# Patient Record
Sex: Female | Born: 1945 | Race: White | Hispanic: No | Marital: Married | Smoking: Former smoker
Health system: Southern US, Community
[De-identification: ages and names within clinical notes are randomized; demographics above are authoritative.]

## PROBLEM LIST (undated history)

## (undated) DIAGNOSIS — E669 Obesity, unspecified: Secondary | ICD-10-CM

## (undated) DIAGNOSIS — I10 Essential (primary) hypertension: Secondary | ICD-10-CM

## (undated) DIAGNOSIS — J449 Chronic obstructive pulmonary disease, unspecified: Secondary | ICD-10-CM

## (undated) DIAGNOSIS — K635 Polyp of colon: Secondary | ICD-10-CM

## (undated) DIAGNOSIS — C801 Malignant (primary) neoplasm, unspecified: Secondary | ICD-10-CM

## (undated) DIAGNOSIS — K2270 Barrett's esophagus without dysplasia: Secondary | ICD-10-CM

## (undated) DIAGNOSIS — Z9989 Dependence on other enabling machines and devices: Secondary | ICD-10-CM

## (undated) DIAGNOSIS — G4733 Obstructive sleep apnea (adult) (pediatric): Secondary | ICD-10-CM

## (undated) DIAGNOSIS — Z87891 Personal history of nicotine dependence: Secondary | ICD-10-CM

## (undated) DIAGNOSIS — K219 Gastro-esophageal reflux disease without esophagitis: Secondary | ICD-10-CM

## (undated) DIAGNOSIS — E785 Hyperlipidemia, unspecified: Secondary | ICD-10-CM

## (undated) HISTORY — DX: Chronic obstructive pulmonary disease, unspecified: J44.9

## (undated) HISTORY — DX: Obstructive sleep apnea (adult) (pediatric): G47.33

## (undated) HISTORY — DX: Hyperlipidemia, unspecified: E78.5

## (undated) HISTORY — DX: Personal history of nicotine dependence: Z87.891

## (undated) HISTORY — PX: LIVER BIOPSY: SHX301

## (undated) HISTORY — PX: COLONOSCOPY: SHX174

## (undated) HISTORY — PX: SKIN CANCER EXCISION: SHX779

## (undated) HISTORY — PX: ESOPHAGOGASTRODUODENOSCOPY: SHX1529

## (undated) HISTORY — DX: Dependence on other enabling machines and devices: Z99.89

---

## 1975-06-16 HISTORY — PX: APPENDECTOMY: SHX54

## 1989-06-15 HISTORY — PX: ABDOMINAL HYSTERECTOMY: SHX81

## 2001-08-03 LAB — HM COLONOSCOPY

## 2006-06-15 HISTORY — PX: NASAL SINUS SURGERY: SHX719

## 2009-04-30 LAB — PULMONARY FUNCTION TEST

## 2010-07-18 LAB — HM PAP SMEAR: HM Pap smear: NORMAL

## 2010-08-03 LAB — HM MAMMOGRAPHY: HM Mammogram: NORMAL

## 2011-07-14 DIAGNOSIS — H40009 Preglaucoma, unspecified, unspecified eye: Secondary | ICD-10-CM | POA: Diagnosis not present

## 2011-08-05 DIAGNOSIS — H40009 Preglaucoma, unspecified, unspecified eye: Secondary | ICD-10-CM | POA: Diagnosis not present

## 2011-08-19 ENCOUNTER — Institutional Professional Consult (permissible substitution): Payer: Self-pay | Admitting: Pulmonary Disease

## 2011-08-25 ENCOUNTER — Encounter: Payer: Self-pay | Admitting: Pulmonary Disease

## 2011-08-25 ENCOUNTER — Ambulatory Visit (INDEPENDENT_AMBULATORY_CARE_PROVIDER_SITE_OTHER): Payer: Medicare Other | Admitting: Pulmonary Disease

## 2011-08-25 VITALS — BP 128/70 | HR 85 | Temp 98.4°F | Ht 62.5 in | Wt 172.6 lb

## 2011-08-25 DIAGNOSIS — G4733 Obstructive sleep apnea (adult) (pediatric): Secondary | ICD-10-CM | POA: Diagnosis not present

## 2011-08-25 NOTE — Patient Instructions (Signed)
Continue with cpap, and keep up with mask changes and supplies. Work on weight reduction If you continue to have nonrestorative sleep, would consider rechecking your pressure with an autoset device at home. If doing well, followup with me in one year.

## 2011-08-25 NOTE — Assessment & Plan Note (Signed)
The patient has a long history of obstructive sleep apnea diagnosed back in 2004, but has done very well on CPAP therapy.  I do not have her original study for review, but will obtain the records from her physician in [place].  She is having no issues with her mask fit or pressure, and denies any issues with sleepiness during the day.  She is having some nonrestorative sleep, but believes this is not due to her sleep apnea.  If this continues, I have asked her to consider optimizing her pressure again at home on automatic device.  I have also encouraged her to work aggressively on weight loss.  If she continues to do well with her CPAP, I will see her back in one year for followup.

## 2011-08-25 NOTE — Progress Notes (Signed)
  Subjective:    Patient ID: Traci Hall, female    DOB: 1945/10/31, 66 y.o.   MRN: [id number]  HPI The patient is a 66 year old female who comes in today to establish with a sleep specialist for management of obstructive sleep apnea.  She was initially diagnosed with sleep apnea in 2004, and has been on CPAP since that time.  Her current machine is approximately 66 years old, is set at 7 cm, and her pressure is delivered by a nasal CPAP mask.  She has a heated humidifier, but does not use it as her personal preference.  The patient is wearing CPAP compliantly, and feels that she has benefited greatly from the device.  She feels that she cannot sleep without it.  She is having no breakthrough snoring, and feels that she sleeps well.  She does not feel fully rested in the mornings upon arising, but thinks this is more of a medical issue than her sleep apnea.  The patient denies any sleepiness issues during the day, and is very happy with her alertness.  Her Epworth sleepiness score today is zero.  Her weight has increased about 15 pounds over the last 1-2 years.  Sleep Questionnaire: What time do you typically go to bed?( Between what hours) 10-11pm How long does it take you to fall asleep? 15 to 20 min How many times during the night do you wake up? 1 What time do you get out of bed to start your day? 0830 Do you drive or operate heavy machinery in your occupation? No How much has your weight changed (up or down) over the past two years? (In pounds) 15 lb (6.804 kg) Have you ever had a sleep study before? Yes If yes, location of study? [facility] [place] If yes, date of study? 03/23/03 Do you currently use CPAP? Yes If so, what pressure? 7 Do you wear oxygen at any time? No     Review of Systems  Constitutional: Negative for fever and unexpected weight change.  HENT: Negative for ear pain, nosebleeds, congestion, sore throat, rhinorrhea, sneezing, trouble swallowing, dental problem, postnasal drip and  sinus pressure.   Eyes: Positive for itching. Negative for redness.  Respiratory: Positive for chest tightness. Negative for cough, shortness of breath and wheezing.   Cardiovascular: Negative for palpitations and leg swelling.  Gastrointestinal: Negative for nausea and vomiting.  Genitourinary: Negative for dysuria.  Musculoskeletal: Negative for joint swelling.  Skin: Negative for rash.  Neurological: Negative for headaches.  Hematological: Does not bruise/bleed easily.  Psychiatric/Behavioral: Negative for dysphoric mood. The patient is not nervous/anxious.        Objective:   Physical Exam Constitutional:  Overweight female, no acute distress  HENT:  Nares patent without discharge  Oropharynx without exudate, palate and uvula are mildly elongated.   Eyes:  Perrla, eomi, no scleral icterus  Neck:  No JVD, no TMG  Cardiovascular:  Normal rate, regular rhythm, no rubs or gallops.  No murmurs        Intact distal pulses  Pulmonary :  Normal breath sounds, no stridor or respiratory distress   No rales, rhonchi, or wheezing  Abdominal:  Soft, nondistended, bowel sounds present.  No tenderness noted.   Musculoskeletal:  minimal lower extremity edema noted.  Lymph Nodes:  No cervical lymphadenopathy noted  Skin:  No cyanosis noted  Neurologic:  Alert, appropriate, moves all 4 extremities without obvious deficit.         Assessment & Plan:

## 2011-08-27 ENCOUNTER — Telehealth: Payer: Self-pay | Admitting: Pulmonary Disease

## 2011-08-27 NOTE — Telephone Encounter (Signed)
Received copies from [facility] ,on 08/27/11 . Forwarded 15  pages to Dr Shelle Iron. ,for review.

## 2011-09-01 ENCOUNTER — Encounter: Payer: Self-pay | Admitting: Pulmonary Disease

## 2011-09-01 ENCOUNTER — Ambulatory Visit (INDEPENDENT_AMBULATORY_CARE_PROVIDER_SITE_OTHER): Payer: Medicare Other | Admitting: Pulmonary Disease

## 2011-09-01 VITALS — BP 116/72 | HR 72 | Temp 97.7°F | Ht 62.5 in | Wt 171.0 lb

## 2011-09-01 DIAGNOSIS — J4489 Other specified chronic obstructive pulmonary disease: Secondary | ICD-10-CM

## 2011-09-01 DIAGNOSIS — J309 Allergic rhinitis, unspecified: Secondary | ICD-10-CM | POA: Diagnosis not present

## 2011-09-01 DIAGNOSIS — J449 Chronic obstructive pulmonary disease, unspecified: Secondary | ICD-10-CM

## 2011-09-01 NOTE — Assessment & Plan Note (Signed)
Currently well controlled on her current regimen.  I think that we will try to stop the montelukast after pollen season as I don't see a clear indication for it now.  Continue other meds at this point.

## 2011-09-01 NOTE — Progress Notes (Signed)
Subjective:    Patient ID: Traci Hall, female    DOB: October 07, 1945, 66 y.o.   MRN: [id number]  HPI This is a 66 y/o female who is self referred to [place] for evaluation and treatment of her asthma.  Her childhood was normal, with no respiratory problems, but she had some allergies when [age] years old, specifically hay fever.  This was treated with OTC meds and well controlled.  In her 40's she developed wheezing and mild dyspnea.  In 2004 she was sent for PFT's which showed mild obstruction.  During 2010 her symptoms worsened, and she developed three exacerbations  These were described as wheezing, some shortness of breath with chest congestion, no sputum production but she did have a dry cough.  These were treated with nebulizers and prednisone by her PCP.  She has also had bronchitis multiple times which has been treated well z-packs.  She was evaluated by a pulmonologist once in [facility] who felt that reflux contributed to her asthma.  She quit smoking in 2006 with the help of wellbutrin.  She denies a chronic cough.  She notes that she now has dyspnea only with vigorous exercise such as climbing a hill.    She denies chest pain, but has noticed trace ankle swelling usually after having salt.  She does note some environmental triggers to her shortness of breath: diesel fumes, severely dusty environments.   Albuterol helps when she uses it, but her use is minimal.  She reports that she regularly uses it in the mornings as a matter of routine, and only once per week otherwise as needed.  She notes that she does have worsening allergic rhinitis symptoms in the spring with pollen.  She has been on montelukast and singulair for years and these have helped control these symptoms.  She does not exercise regularly.    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  She is now in a new home, and is consdering exercising more.    Past Medical History  Diagnosis Date   . Asthma   . Allergic rhinitis   . OSA on CPAP      Family History  Problem Relation Age of Onset  . Heart disease Father      History   Social History  . Marital Status: Married    Spouse Name: N/A    Number of Children: 2  . Years of Education: N/A   Occupational History  . retired     Social History Main Topics  . Smoking status: Former Smoker -- 1.5 packs/day for 45 years    Types: Cigarettes    Quit date: 06/15/2005  . Smokeless tobacco: Never Used  . Alcohol Use: Yes  . Drug Use: No  . Sexually Active: Not on file   Other Topics Concern  . Not on file   Social History Narrative  . No narrative on file     Allergies  Allergen Reactions  . Demerol     Over sensitivity  . Fosamax     Leg cramps   . Naproxen     Leg cramps an swelling    . Statins     Cramps in legs      Outpatient Prescriptions Prior to Visit  Medication Sig Dispense Refill  . albuterol (PROVENTIL HFA) 108 (90 BASE) MCG/ACT inhaler 2 puffs every am, and then as needed      . aspirin 81 MG tablet Take  81 mg by mouth daily.      . Bimatoprost (LUMIGAN OP) Apply to eye. 1 drop in each eye pm      . desloratadine (CLARINEX) 5 MG tablet Take 5 mg by mouth daily.      . Ergocalciferol (VITAMIN D2 PO) Take 1 tablet by mouth every 7 (seven) days.       . famotidine (PEPCID) 20 MG tablet Take 20 mg by mouth 2 (two) times daily.      . Fluticasone Propionate, Inhal, (FLOVENT DISKUS) 100 MCG/BLIST AEPB Inhale 1 puff into the lungs daily.       . Glucosamine-Chondroit-Vit C-Mn (GLUCOSAMINE 1500 COMPLEX PO) Take 1,500 tablets by mouth 2 (two) times daily.      . hydrocortisone (ANUSOL-HC) 25 MG suppository Place 25 mg rectally. Prn      . montelukast (SINGULAIR) 10 MG tablet Take 10 mg by mouth at bedtime.      . Multiple Vitamins-Minerals (CENTRUM SILVER PO) Take by mouth. Take 1 am      . olopatadine (PATANOL) 0.1 % ophthalmic solution 1 drop 2 (two) times daily.      Traci Hall  Glycol-Propyl Glycol (SYSTANE) 0.4-0.3 % SOLN As directed when needed      . tiotropium (SPIRIVA) 18 MCG inhalation capsule Place 18 mcg into inhaler and inhale daily.      . vitamin C (ASCORBIC ACID) 500 MG tablet Take 500 mg by mouth daily.      . vitamin E (VITAMIN E) 400 UNIT capsule Take 400 Units by mouth daily.      Marland Kitchen zolpidem (AMBIEN) 10 MG tablet Take 10 mg by mouth at bedtime as needed.           Review of Systems  Constitutional: Negative for fever, chills and unexpected weight change.  HENT: Negative for ear pain, nosebleeds, congestion, sore throat, rhinorrhea, sneezing, trouble swallowing, dental problem, voice change, postnasal drip and sinus pressure.   Eyes: Negative for visual disturbance.  Respiratory: Positive for chest tightness and shortness of breath. Negative for cough and choking.   Cardiovascular: Negative for chest pain and leg swelling.  Gastrointestinal: Negative for vomiting, abdominal pain and diarrhea.  Genitourinary: Negative for difficulty urinating.  Musculoskeletal: Positive for arthralgias.  Skin: Negative for rash.  Neurological: Negative for tremors, syncope and headaches.  Hematological: Does not bruise/bleed easily.       Objective:   Physical Exam  Filed Vitals:   09/01/11 1452  BP: 116/72  Pulse: 72  Temp: 97.7 F (36.5 C)  TempSrc: Oral  Height: 5' 2.5" (1.588 m)  Weight: 171 lb (77.565 kg)  SpO2: 98%   Gen: well appearing, no acute distress HEENT: NCAT, PERRL, EOMi, OP clear, neck supple without masses PULM: CTA B, no wheezing CV: RRR, no mgr, no JVD AB: BS+, soft, nontender, no hsm Ext: warm, no edema, no clubbing, no cyanosis Derm: no rash or skin breakdown Neuro: A&Ox4, CN II-XII intact, strength 5/5 in all 4 extremities  Simple spirometry performed today in clinic: F/F ratio 67% FEV1 1.74L (79% pred)     Assessment & Plan:   COPD (chronic obstructive pulmonary disease) COPD:  I explained to Ms. Traci Hall that I  feel it is more likely that she has COPD than asthma (as she has been diagnosed before).  She does have some symptoms which could be consistent with asthma such as her allergic rhinitis and response to steroids in the past.  However in this former heavy smoker  with fixed obstruction and lack of a significant bronchodilator response to prior PFT's I think that it is more likely that she has COPD with some airway hyperreactivity.  GOLD Stage II, mMRC score 1 Combined recommendations from the [employer], [employer] of [employer]icians, Designer, television/film set, European Respiratory Society (Qaseem A et al, Ann Intern Med. 2011;155(3):179) recommends tobacco cessation, pulmonary rehab (for symptomatic patients with an FEV1 < 50% predicted), supplemental oxygen (for patients with SaO2 <88% or paO2 <55), and appropriate bronchodilator therapy.  In regards to long acting bronchodilators, they recommend monotherapy (FEV1 60-80% with symptoms weak evidence, FEV1 with symptoms <60% strong evidence), or combination therapy (FEV1 <60% with symptoms, strong recommendation, moderate evidence).  One should also provide patients with annual immunizations and consider therapy for prevention of COPD exacerbations (ie. roflumilast or azithromycin) when appopriate.  -O2 therapy: not indicated -Immunizations: up to date -Tobacco use: quit 2010 -Exercise: encouraged to exercise regularly, goal 35 minutes daily -Bronchodilator therapy: no changes to current regimen, but I think we could stop the montelukast after allergy season -Exacerbation prevention: no indication at this point   Rhinitis, allergic Currently well controlled on her current regimen.  I think that we will try to stop the montelukast after pollen season as I don't see a clear indication for it now.  Continue other meds at this point.    Updated Medication List Outpatient Encounter Prescriptions as of 09/01/2011  Medication  Sig Dispense Refill  . albuterol (PROVENTIL HFA) 108 (90 BASE) MCG/ACT inhaler 2 puffs every am, and then as needed      . aspirin 81 MG tablet Take 81 mg by mouth daily.      . Bimatoprost (LUMIGAN OP) Apply to eye. 1 drop in each eye pm      . desloratadine (CLARINEX) 5 MG tablet Take 5 mg by mouth daily.      . Ergocalciferol (VITAMIN D2 PO) Take 1 tablet by mouth every 7 (seven) days.       . famotidine (PEPCID) 20 MG tablet Take 20 mg by mouth 2 (two) times daily.      . Fluticasone Propionate, Inhal, (FLOVENT DISKUS) 100 MCG/BLIST AEPB Inhale 1 puff into the lungs daily.       . Glucosamine-Chondroit-Vit C-Mn (GLUCOSAMINE 1500 COMPLEX PO) Take 1,500 tablets by mouth 2 (two) times daily.      . hydrocortisone (ANUSOL-HC) 25 MG suppository Place 25 mg rectally. Prn      . montelukast (SINGULAIR) 10 MG tablet Take 10 mg by mouth at bedtime.      . Multiple Vitamins-Minerals (CENTRUM SILVER PO) Take by mouth. Take 1 am      . olopatadine (PATANOL) 0.1 % ophthalmic solution 1 drop 2 (two) times daily.      Traci Hall Glycol-Propyl Glycol (SYSTANE) 0.4-0.3 % SOLN As directed when needed      . tiotropium (SPIRIVA) 18 MCG inhalation capsule Place 18 mcg into inhaler and inhale daily.      . vitamin C (ASCORBIC ACID) 500 MG tablet Take 500 mg by mouth daily.      . vitamin E (VITAMIN E) 400 UNIT capsule Take 400 Units by mouth daily.      Marland Kitchen zolpidem (AMBIEN) 10 MG tablet Take 10 mg by mouth at bedtime as needed.

## 2011-09-01 NOTE — Assessment & Plan Note (Addendum)
COPD:  I explained to Traci Hall that I feel it is more likely that she has COPD than asthma (as she has been diagnosed before).  She does have some symptoms which could be consistent with asthma such as her allergic rhinitis and response to steroids in the past.  However in this former heavy smoker with fixed obstruction and lack of a significant bronchodilator response to prior PFT's I think that it is more likely that she has COPD with some airway hyperreactivity.  GOLD Stage II, mMRC score 1 Combined recommendations from the [employer], [employer]icians, Designer, television/film set, European Respiratory Society (Qaseem A et al, Ann Intern Med. 2011;155(3):179) recommends tobacco cessation, pulmonary rehab (for symptomatic patients with an FEV1 < 50% predicted), supplemental oxygen (for patients with SaO2 <88% or paO2 <55), and appropriate bronchodilator therapy.  In regards to long acting bronchodilators, they recommend monotherapy (FEV1 60-80% with symptoms weak evidence, FEV1 with symptoms <60% strong evidence), or combination therapy (FEV1 <60% with symptoms, strong recommendation, moderate evidence).  One should also provide patients with annual immunizations and consider therapy for prevention of COPD exacerbations (ie. roflumilast or azithromycin) when appopriate.  -O2 therapy: not indicated -Immunizations: up to date -Tobacco use: quit 2010 -Exercise: encouraged to exercise regularly, goal 35 minutes daily -Bronchodilator therapy: no changes to current regimen, but I think we could stop the montelukast after allergy season -Exacerbation prevention: no indication at this point

## 2011-09-01 NOTE — Patient Instructions (Signed)
Continue taking your medications as prescribed.  Try to exercise 35 minutes daily. (Gradually increase your exercise until you reach this goal.) We will send you for a chest x-ray at the [facility]. We will see you back here in three months.

## 2011-09-02 ENCOUNTER — Ambulatory Visit (INDEPENDENT_AMBULATORY_CARE_PROVIDER_SITE_OTHER)
Admission: RE | Admit: 2011-09-02 | Discharge: 2011-09-02 | Disposition: A | Discharge status: Home / Self Care | Payer: Medicare Other | Source: Ambulatory Visit | Attending: Pulmonary Disease | Admitting: Pulmonary Disease

## 2011-09-02 DIAGNOSIS — J449 Chronic obstructive pulmonary disease, unspecified: Secondary | ICD-10-CM | POA: Diagnosis not present

## 2011-09-03 ENCOUNTER — Telehealth: Payer: Self-pay | Admitting: Pulmonary Disease

## 2011-09-03 ENCOUNTER — Telehealth: Payer: Self-pay | Admitting: *Deleted

## 2011-09-03 NOTE — Telephone Encounter (Signed)
Received copies from [facility] ,on3/21/13 . Forwarded 146 pages to [facility] ,for review.

## 2011-09-03 NOTE — Telephone Encounter (Signed)
Spoke with pt and notified cxr normal per Dr. Kendrick Fries. Pt verbalized understanding and denied any questions.

## 2011-09-03 NOTE — Telephone Encounter (Signed)
Message copied by Christen Butter on Thu Sep 03, 2011  1:09 PM ------      Message from: Veto Kemps B      Created: Wed Sep 02, 2011  6:00 PM       L,            Can you let her know that her CXR was normal?            Thanks,      B

## 2011-09-15 ENCOUNTER — Encounter: Payer: Self-pay | Admitting: Pulmonary Disease

## 2011-10-02 ENCOUNTER — Ambulatory Visit (INDEPENDENT_AMBULATORY_CARE_PROVIDER_SITE_OTHER): Payer: Medicare Other | Admitting: Internal Medicine

## 2011-10-02 ENCOUNTER — Encounter: Payer: Self-pay | Admitting: Internal Medicine

## 2011-10-02 VITALS — BP 132/68 | HR 78 | Temp 98.5°F | Resp 16 | Ht 62.5 in | Wt 173.0 lb

## 2011-10-02 DIAGNOSIS — R7309 Other abnormal glucose: Secondary | ICD-10-CM | POA: Diagnosis not present

## 2011-10-02 DIAGNOSIS — R635 Abnormal weight gain: Secondary | ICD-10-CM | POA: Diagnosis not present

## 2011-10-02 DIAGNOSIS — K7581 Nonalcoholic steatohepatitis (NASH): Secondary | ICD-10-CM

## 2011-10-02 DIAGNOSIS — G4733 Obstructive sleep apnea (adult) (pediatric): Secondary | ICD-10-CM

## 2011-10-02 DIAGNOSIS — R739 Hyperglycemia, unspecified: Secondary | ICD-10-CM

## 2011-10-02 DIAGNOSIS — E669 Obesity, unspecified: Secondary | ICD-10-CM | POA: Diagnosis not present

## 2011-10-02 DIAGNOSIS — Z1239 Encounter for other screening for malignant neoplasm of breast: Secondary | ICD-10-CM

## 2011-10-02 DIAGNOSIS — J309 Allergic rhinitis, unspecified: Secondary | ICD-10-CM

## 2011-10-02 DIAGNOSIS — K7689 Other specified diseases of liver: Secondary | ICD-10-CM | POA: Diagnosis not present

## 2011-10-02 NOTE — Progress Notes (Signed)
Patient ID: Traci Hall, female   DOB: 03/02/1946, 66 y.o.   MRN: [id number]  Patient Active Problem List  Diagnoses  . OSA (obstructive sleep apnea)  . COPD (chronic obstructive pulmonary disease)  . Rhinitis, allergic  . NASH (nonalcoholic steatohepatitis)    Subjective:  CC:   Chief Complaint  Patient presents with  . New Patient    HPI:   Traci Hall a 66 y.o. female who presents  to establish care.  Having problems with allergies,  worse since she relocated from [place], taking singulair and clarinex , no prior trial of zyrtec or allegra.  Prior trial of Flonase was not effective.  History of NASH with liver biopsy  x 2 by [facility] , GI in [place]  with periodic normal liver functions, with medications considered the culprit for elevations  Was taken off of PPIs and placed on  Pepcid 40 mg bid for reflux management, symptoms controlled on 20 mg  Bid.  Has not had labs in one year. [name], PCP in [place] , [place].  Diet complicated by living in a hotel for the past year.  Finally settling into a new home, looking forward to getting back to a regimen.    Past Medical History  Diagnosis Date  . Asthma   . Allergic rhinitis   . OSA on CPAP   . COPD (chronic obstructive pulmonary disease)     by prior PFTs  . Hyperlipidemia     Past Surgical History  Procedure Date  . Nasal sinus surgery 2008  . Appendectomy 1977  . Abdominal hysterectomy 1991    secondary to endometriosis, and polyps         The following portions of the patient's history were reviewed and updated as appropriate: Allergies, current medications, and problem list.    Review of Systems:   12 Pt  review of systems was negative except those addressed in the HPI,     History   Social History  . Marital Status: Married    Spouse Name: N/A    Number of Children: 2  . Years of Education: N/A   Occupational History  . retired     Social History Main Topics  . Smoking status:  Former Smoker -- 1.5 packs/day for 45 years    Types: Cigarettes    Quit date: 06/15/2005  . Smokeless tobacco: Never Used  . Alcohol Use: Yes  . Drug Use: No  . Sexually Active: Not on file   Other Topics Concern  . Not on file   Social History Narrative  . No narrative on file    Objective:  BP 132/68  Pulse 78  Temp(Src) 98.5 F (36.9 C) (Oral)  Resp 16  Ht 5' 2.5" (1.588 m)  Wt 173 lb (78.472 kg)  BMI 31.14 kg/m2  SpO2 97%  General appearance: alert, cooperative and appears stated age Ears: normal TM's and external ear canals both ears Throat: lips, mucosa, and tongue normal; teeth and gums normal Neck: no adenopathy, no carotid bruit, supple, symmetrical, trachea midline and thyroid not enlarged, symmetric, no tenderness/mass/nodules Back: symmetric, no curvature. ROM normal. No CVA tenderness. Lungs: clear to auscultation bilaterally Heart: regular rate and rhythm, S1, S2 normal, no murmur, click, rub or gallop Abdomen: soft, non-tender; bowel sounds normal; no masses,  no organomegaly Pulses: 2+ and symmetric Skin: Skin color, texture, turgor normal. No rashes or lesions Lymph nodes: Cervical, supraclavicular, and axillary nodes normal.  Assessment and Plan:  NASH (nonalcoholic steatohepatitis)  She is due for fasting lipids and CMET.   I have addressed BMI and recommended a low glycemic index diet utilizing smaller more frequent meals to increase metabolism.  I have also recommended that patient start exercising with a goal of 30 minutes of aerobic exercise a minimum of 5 days per week. Screening for lipid disorders, thyroid and diabetes to be done today.    OSA (obstructive sleep apnea) NPSG 2004:  AHI 25/hr Titration 2007: optimal pressure 8.5cm.    Rhinitis, allergic Symptoms not well controlled on current regimen.  Trial of allegra or zyrtec recommended.     Updated Medication List Outpatient Encounter Prescriptions as of 10/02/2011  Medication Sig  Dispense Refill  . albuterol (PROVENTIL HFA) 108 (90 BASE) MCG/ACT inhaler 2 puffs every am, and then as needed      . aspirin 81 MG tablet Take 81 mg by mouth daily.      . Bimatoprost (LUMIGAN OP) Apply to eye. 1 drop in each eye pm      . desloratadine (CLARINEX) 5 MG tablet Take 5 mg by mouth daily.      . Ergocalciferol (VITAMIN D2 PO) Take 1 tablet by mouth every 7 (seven) days.       . famotidine (PEPCID) 20 MG tablet Take 20 mg by mouth 2 (two) times daily.      . Fluticasone Propionate, Inhal, (FLOVENT DISKUS) 100 MCG/BLIST AEPB Inhale 1 puff into the lungs daily.       . Glucosamine-Chondroit-Vit C-Mn (GLUCOSAMINE 1500 COMPLEX PO) Take 1,500 tablets by mouth 2 (two) times daily.      . hydrocortisone (ANUSOL-HC) 25 MG suppository Place 25 mg rectally. Prn      . montelukast (SINGULAIR) 10 MG tablet Take 10 mg by mouth at bedtime.      . Multiple Vitamins-Minerals (CENTRUM SILVER PO) Take by mouth. Take 1 am      . olopatadine (PATANOL) 0.1 % ophthalmic solution 1 drop 2 (two) times daily.      [name] Glycol-Propyl Glycol (SYSTANE) 0.4-0.3 % SOLN As directed when needed      . tiotropium (SPIRIVA) 18 MCG inhalation capsule Place 18 mcg into inhaler and inhale daily.      . vitamin C (ASCORBIC ACID) 500 MG tablet Take 500 mg by mouth daily.      . vitamin E (VITAMIN E) 400 UNIT capsule Take 400 Units by mouth daily.      Marland Kitchen zolpidem (AMBIEN) 10 MG tablet Take 10 mg by mouth at bedtime as needed.          Orders Placed This Encounter  Procedures  . HM MAMMOGRAPHY  . MM Digital Screening  . COMPLETE METABOLIC PANEL WITH GFR  . Lipid panel  . TSH  . Hemoglobin A1c  . HM COLONOSCOPY    Return in about 1 month (around 11/01/2011).

## 2011-10-02 NOTE — Patient Instructions (Addendum)
Consider trial of allegra (fexofenadine ,  180 mg dose ) instead  of clarinex,   or zyrtec at bedtime ( cetirizine 10 mg daily )   Consider the Low Glycemic Index Diet and 6 smaller meals daily  To manage fatty liver,  Weight, and borderline insulin resistance.     7 AM Low carbohydrate Protein Shakes (EAS Carb Control  Or Atkins ,  Available everywhere,   In  cases at [employer] )  2.5 carbs  (Add or substitute a toasted sandwhich thin w/ peanut butter)  10 AM: Protein bar by Atkins (snack size,  Chocolate lover's variety at  [employer])    Lunch: sandwich on pita bread or flatbread (Joseph's makes a pita bread and a flat bread , available at [employer] and [employer]; Toufayah makes a low carb flatbread available at [employer] and [employer])  Mission makes a low carb whole wheat tortilla to make sandwiches and breakfast burritos.    3 PM:  Mid day :  Another protein bar,  Or a  cheese stick, 1/4 cup of almonds, walnuts, pistachios, pecans, peanuts,  Macadamia nuts  6 PM  Dinner:  "mean and green:"  Meat/chicken/fish, salad, and green veggie : use ranch, vinagrette,  Blue cheese, etc  9 PM snack : Breyer's low carb fudgiscle or  ice cream bar (Carb Smart) Weight Watcher's ice cream bar , or another protein shake

## 2011-10-02 NOTE — Assessment & Plan Note (Addendum)
She is due for fasting lipids and CMET.   I have addressed BMI and recommended a low glycemic index diet utilizing smaller more frequent meals to increase metabolism.  I have also recommended that patient start exercising with a goal of 30 minutes of aerobic exercise a minimum of 5 days per week. Screening for lipid disorders, thyroid and diabetes to be done today.

## 2011-10-04 NOTE — Assessment & Plan Note (Signed)
Symptoms not well controlled on current regimen.  Trial of allegra or zyrtec recommended.

## 2011-10-04 NOTE — Assessment & Plan Note (Signed)
NPSG 2004:  AHI 25/hr Titration 2007: optimal pressure 8.5cm.

## 2011-10-06 DIAGNOSIS — Z1231 Encounter for screening mammogram for malignant neoplasm of breast: Secondary | ICD-10-CM | POA: Diagnosis not present

## 2011-10-06 LAB — HM MAMMOGRAPHY: HM Mammogram: NORMAL

## 2011-10-27 ENCOUNTER — Telehealth: Payer: Self-pay | Admitting: Internal Medicine

## 2011-10-27 ENCOUNTER — Encounter: Payer: Self-pay | Admitting: Internal Medicine

## 2011-10-27 NOTE — Telephone Encounter (Signed)
Patient notified her mammogram is normal. 

## 2011-10-30 ENCOUNTER — Encounter: Payer: Self-pay | Admitting: Internal Medicine

## 2011-11-05 ENCOUNTER — Encounter: Payer: Medicare Other | Admitting: Internal Medicine

## 2011-11-12 ENCOUNTER — Other Ambulatory Visit: Payer: Medicare Other

## 2011-11-12 ENCOUNTER — Other Ambulatory Visit (INDEPENDENT_AMBULATORY_CARE_PROVIDER_SITE_OTHER): Payer: Medicare Other | Admitting: *Deleted

## 2011-11-12 DIAGNOSIS — R7309 Other abnormal glucose: Secondary | ICD-10-CM | POA: Diagnosis not present

## 2011-11-12 DIAGNOSIS — K7581 Nonalcoholic steatohepatitis (NASH): Secondary | ICD-10-CM

## 2011-11-12 DIAGNOSIS — K7689 Other specified diseases of liver: Secondary | ICD-10-CM

## 2011-11-12 DIAGNOSIS — R739 Hyperglycemia, unspecified: Secondary | ICD-10-CM

## 2011-11-12 DIAGNOSIS — R635 Abnormal weight gain: Secondary | ICD-10-CM

## 2011-11-12 LAB — COMPREHENSIVE METABOLIC PANEL
ALT: 53 U/L — ABNORMAL HIGH (ref 0–35)
AST: 40 U/L — ABNORMAL HIGH (ref 0–37)
Albumin: 3.9 g/dL (ref 3.5–5.2)
CO2: 26 mEq/L (ref 19–32)
Calcium: 8.9 mg/dL (ref 8.4–10.5)
Chloride: 108 mEq/L (ref 96–112)
Potassium: 4.2 mEq/L (ref 3.5–5.1)
Sodium: 142 mEq/L (ref 135–145)
Total Protein: 6.7 g/dL (ref 6.0–8.3)

## 2011-11-12 LAB — HEMOGLOBIN A1C: Hgb A1c MFr Bld: 5.9 % (ref 4.6–6.5)

## 2011-11-12 LAB — TSH: TSH: 2.63 u[IU]/mL (ref 0.35–5.50)

## 2011-11-16 ENCOUNTER — Ambulatory Visit (INDEPENDENT_AMBULATORY_CARE_PROVIDER_SITE_OTHER): Payer: Medicare Other | Admitting: Pulmonary Disease

## 2011-11-16 ENCOUNTER — Encounter: Payer: Self-pay | Admitting: Pulmonary Disease

## 2011-11-16 ENCOUNTER — Ambulatory Visit: Payer: Medicare Other | Admitting: Pulmonary Disease

## 2011-11-16 VITALS — BP 114/64 | HR 77 | Temp 98.3°F | Ht 62.5 in | Wt 171.1 lb

## 2011-11-16 DIAGNOSIS — J449 Chronic obstructive pulmonary disease, unspecified: Secondary | ICD-10-CM | POA: Diagnosis not present

## 2011-11-16 MED ORDER — TIOTROPIUM BROMIDE MONOHYDRATE 18 MCG IN CAPS: 18.0000 ug | ORAL_CAPSULE | Freq: Every day | RESPIRATORY_TRACT | Status: DC

## 2011-11-16 MED ORDER — DESLORATADINE 5 MG PO TABS: 5.0000 mg | ORAL_TABLET | Freq: Every day | ORAL | Status: DC

## 2011-11-16 MED ORDER — FLUTICASONE PROPIONATE (INHAL) 100 MCG/BLIST IN AEPB: 1.0000 | INHALATION_SPRAY | Freq: Two times a day (BID) | RESPIRATORY_TRACT | Status: DC

## 2011-11-16 MED ORDER — ALBUTEROL SULFATE HFA 108 (90 BASE) MCG/ACT IN AERS: 2.0000 | INHALATION_SPRAY | RESPIRATORY_TRACT | Status: DC | PRN

## 2011-11-16 NOTE — Patient Instructions (Signed)
Try to stop the singulair.  If you feel increase chest congestion, then increase the flovent to twice per day dosing before resuming the singulair. Otherwise continue your medications. We will see you back in 6 months or sooner if needed.

## 2011-11-16 NOTE — Progress Notes (Signed)
Subjective:    Patient ID: Traci Hall, female    DOB: [birth year], 66 y.o.   MRN: [id number]  Synopsis: Traci Hall first saw [facility] in 08/2011 for shortness of breath.  She was diagnosed with Grade A COPD.  She previously smoked 1.5 ppd for 45 years.  She has allergic rhinitis as well.  HPI   11/16/11 ROV -- Traci Hall has been doing very well.  She only had to use her albuterol a few times in the last few months due to chest congestion, but really has not had any trouble breathing.  She feels that she could stop the singulair.  She is taking the flovent once per day.  She continues to use the spiriva once per day.   Past Medical History  Diagnosis Date  . Asthma   . Allergic rhinitis   . OSA on CPAP   . COPD (chronic obstructive pulmonary disease)     by prior PFTs  . Hyperlipidemia       Review of Systems  Constitutional: Negative for fever, chills and unexpected weight change.  HENT: Positive for congestion. Negative for ear pain, nosebleeds, rhinorrhea and postnasal drip.   Respiratory: Negative for cough, choking, chest tightness and shortness of breath.   Cardiovascular: Negative for chest pain and leg swelling.       Objective:   Physical Exam   Filed Vitals:   11/16/11 1431  BP: 114/64  Pulse: 77  Temp: 98.3 F (36.8 C)  TempSrc: Oral  Height: 5' 2.5" (1.588 m)  Weight: 171 lb 1.9 oz (77.62 kg)  SpO2: 97%   Gen: well appearing, no acute distress HEENT: NCAT, PERRL, EOMi, OP clear, neck supple without masses PULM: CTA B, no wheezing CV: RRR, no mgr, no JVD AB: BS+, soft, nontender, no hsm Ext: warm, no edema, no clubbing, no cyanosis  08/2011 Simple spirometry: F/F ratio 67% FEV1 1.74L (79% pred)     Assessment & Plan:   COPD (chronic obstructive pulmonary disease) This has been a very stable interval for Traci Hall, Traci Hall.  She only had to use her albuterol a few times in the last few months and has been doing very well otherwise.  She  thinks that she could stop the singulair.  She is only using the Flovent once per day which is OK with me.   Plan: -stop singulair (trial) -if chest congestion develops after stopping singulair, then increase Flovent to bid -continue Spiriva -RTC in 6 months     Updated Medication List Outpatient Encounter Prescriptions as of 11/16/2011  Medication Sig Dispense Refill  . albuterol (PROVENTIL HFA) 108 (90 BASE) MCG/ACT inhaler 2 puffs every am, and then as needed      . aspirin 81 MG tablet Take 81 mg by mouth daily.      . Bimatoprost (LUMIGAN OP) Apply to eye. 1 drop in each eye pm      . desloratadine (CLARINEX) 5 MG tablet Take 5 mg by mouth daily.      . Ergocalciferol (VITAMIN D2 PO) Take 1 tablet by mouth every 7 (seven) days.       . famotidine (PEPCID) 20 MG tablet Take 20 mg by mouth 2 (two) times daily.      . Fluticasone Propionate, Inhal, (FLOVENT DISKUS) 100 MCG/BLIST AEPB Inhale 1 puff into the lungs daily.       . hydrocortisone (ANUSOL-HC) 25 MG suppository Place 25 mg rectally. Prn      . montelukast (SINGULAIR)  10 MG tablet Take 10 mg by mouth at bedtime.      . Multiple Vitamins-Minerals (CENTRUM SILVER PO) Take by mouth. Take 1 am      . olopatadine (PATANOL) 0.1 % ophthalmic solution 1 drop 2 (two) times daily.      [name] Glycol-Propyl Glycol (SYSTANE) 0.4-0.3 % SOLN As directed when needed      . tiotropium (SPIRIVA) 18 MCG inhalation capsule Place 18 mcg into inhaler and inhale daily.      . vitamin C (ASCORBIC ACID) 500 MG tablet Take 500 mg by mouth daily.      . vitamin E (VITAMIN E) 400 UNIT capsule Take 400 Units by mouth daily.      Marland Kitchen zolpidem (AMBIEN) 10 MG tablet Take 10 mg by mouth at bedtime as needed.       Marland Kitchen DISCONTD: Glucosamine-Chondroit-Vit C-Mn (GLUCOSAMINE 1500 COMPLEX PO) Take 1,500 tablets by mouth 2 (two) times daily.

## 2011-11-16 NOTE — Assessment & Plan Note (Signed)
This has been a very stable interval for Traci Hall.  Traci Hall only had to use her albuterol a few times in the last few months and has been doing very well otherwise.  Traci Hall thinks that Traci Hall could stop the singulair.  Traci Hall is only using the Flovent once per day which is OK with me.   Plan: -stop singulair (trial) -if chest congestion develops after stopping singulair, then increase Flovent to bid -continue Spiriva -RTC in 6 months

## 2011-11-17 ENCOUNTER — Encounter: Payer: Self-pay | Admitting: Internal Medicine

## 2011-11-17 ENCOUNTER — Ambulatory Visit (INDEPENDENT_AMBULATORY_CARE_PROVIDER_SITE_OTHER): Payer: Medicare Other | Admitting: Internal Medicine

## 2011-11-17 VITALS — BP 130/68 | HR 82 | Temp 98.0°F | Resp 16 | Ht 62.5 in | Wt 172.5 lb

## 2011-11-17 DIAGNOSIS — K7689 Other specified diseases of liver: Secondary | ICD-10-CM | POA: Diagnosis not present

## 2011-11-17 DIAGNOSIS — Z1211 Encounter for screening for malignant neoplasm of colon: Secondary | ICD-10-CM

## 2011-11-17 DIAGNOSIS — Z0000 Encounter for general adult medical examination without abnormal findings: Secondary | ICD-10-CM

## 2011-11-17 DIAGNOSIS — E559 Vitamin D deficiency, unspecified: Secondary | ICD-10-CM

## 2011-11-17 DIAGNOSIS — K7581 Nonalcoholic steatohepatitis (NASH): Secondary | ICD-10-CM

## 2011-11-17 MED ORDER — ZOLPIDEM TARTRATE 10 MG PO TABS: 10.0000 mg | ORAL_TABLET | Freq: Every evening | ORAL | Status: DC | PRN

## 2011-11-17 NOTE — Assessment & Plan Note (Signed)
By biopsy,

## 2011-11-17 NOTE — Progress Notes (Signed)
Patient ID: Traci Hall, female   DOB: 11/28/1945, 66 y.o.   MRN: [id number]  The patient is here for annual Medicare wellness examination and management of other chronic and acute problems.  Right shoulder busisitis from leave raking  Internal rotation .  Emotionally stressed since husband had his MI. She is hadnling all household issues and has no time to herself.  He is showing signs of dementia.     The risk factors are reflected in the social history.  The roster of all physicians providing medical care to patient - is listed in the Snapshot section of the chart.  Activities of daily living:  The patient is 100% independent in all ADLs: dressing, toileting, feeding as well as independent mobility  Home safety : The patient has smoke detectors in the home. They wear seatbelts.  There are no firearms at home. There is no violence in the home.   There is no risks for hepatitis, STDs or HIV. There is no   history of blood transfusion. They have no travel history to infectious disease endemic areas of the world.  The patient has seen their dentist in the last six month. They have seen their eye doctor in the last year. They admit to slight hearing difficulty with regard to whispered voices and some television programs.  They have deferred audiologic testing in the last year.  They do not  have excessive sun exposure. Discussed the need for sun protection: hats, long sleeves and use of sunscreen if there is significant sun exposure.   Diet: the importance of a healthy diet is discussed. They do have a healthy diet.  The benefits of regular aerobic exercise were discussed. She walks 4 times per week ,  20 minutes.   Depression screen: there are no signs or vegative symptoms of depression- irritability, change in appetite, anhedonia, sadness/tearfullness.  Cognitive assessment: the patient manages all their financial and personal affairs and is actively engaged. They could relate  day,date,year and events; recalled 2/3 objects at 3 minutes; performed clock-face test normally.  The following portions of the patient's history were reviewed and updated as appropriate: allergies, current medications, past family history, past medical history,  past surgical history, past social history  and problem list.  Visual acuity was not assessed per patient preference since she has regular follow up with her ophthalmologist. Hearing and body mass index were assessed and reviewed.   During the course of the visit the patient was educated and counseled about appropriate screening and preventive services including : fall prevention , diabetes screening, nutrition counseling, colorectal cancer screening, and recommended immunizations.

## 2011-11-17 NOTE — Patient Instructions (Addendum)
Your liver enzymes are only minimally elevated.    You can lower your triglycerides with daily 30 minutes of exercise  ( 5 days per week)  Moderation  Means one drink per night (whatever you prefer)  Avoid tylenol  Use ibuprofen as needed for shoulder pain   Continue an aspirin  Daily for stroke prevention   Go to the [facility] and get a free DtaP vaccine   [address]

## 2011-12-01 ENCOUNTER — Telehealth: Payer: Self-pay | Admitting: Pulmonary Disease

## 2011-12-01 NOTE — Telephone Encounter (Signed)
Spoke with pt. She states that [facility] need [facility]air rx. She states clarification is needed. Looks like when the rx was sent it has 2 separate sets of directions- take 2 puffs every 4 hours prn, and then 2 puffs every am and then prn. I just want to verify this with you before I call pharmacy. Should it be every 4 hours prn? Please advise, thanks!

## 2011-12-01 NOTE — Telephone Encounter (Signed)
Every 4 hours prn. Thanks, Kipp Brood

## 2011-12-02 NOTE — Telephone Encounter (Signed)
[facility] and spoke with Tobi Bastos, a pharmacist there and notified of correct sig. Nothing further needed.Spoke with pt and notified that this was done.

## 2012-01-27 DIAGNOSIS — Z1211 Encounter for screening for malignant neoplasm of colon: Secondary | ICD-10-CM | POA: Diagnosis not present

## 2012-01-27 DIAGNOSIS — K7689 Other specified diseases of liver: Secondary | ICD-10-CM | POA: Diagnosis not present

## 2012-01-28 DIAGNOSIS — H4010X0 Unspecified open-angle glaucoma, stage unspecified: Secondary | ICD-10-CM | POA: Diagnosis not present

## 2012-02-08 DIAGNOSIS — K7689 Other specified diseases of liver: Secondary | ICD-10-CM | POA: Diagnosis not present

## 2012-02-18 ENCOUNTER — Ambulatory Visit: Payer: Self-pay | Admitting: Gastroenterology

## 2012-02-18 DIAGNOSIS — E669 Obesity, unspecified: Secondary | ICD-10-CM | POA: Diagnosis not present

## 2012-02-18 DIAGNOSIS — Z87891 Personal history of nicotine dependence: Secondary | ICD-10-CM | POA: Diagnosis not present

## 2012-02-18 DIAGNOSIS — J449 Chronic obstructive pulmonary disease, unspecified: Secondary | ICD-10-CM | POA: Diagnosis not present

## 2012-02-18 DIAGNOSIS — K5730 Diverticulosis of large intestine without perforation or abscess without bleeding: Secondary | ICD-10-CM | POA: Diagnosis not present

## 2012-02-18 DIAGNOSIS — J309 Allergic rhinitis, unspecified: Secondary | ICD-10-CM | POA: Diagnosis not present

## 2012-02-18 DIAGNOSIS — G4730 Sleep apnea, unspecified: Secondary | ICD-10-CM | POA: Diagnosis not present

## 2012-02-18 DIAGNOSIS — Z79899 Other long term (current) drug therapy: Secondary | ICD-10-CM | POA: Diagnosis not present

## 2012-02-18 DIAGNOSIS — Z85828 Personal history of other malignant neoplasm of skin: Secondary | ICD-10-CM | POA: Diagnosis not present

## 2012-02-18 DIAGNOSIS — Z7982 Long term (current) use of aspirin: Secondary | ICD-10-CM | POA: Diagnosis not present

## 2012-02-18 DIAGNOSIS — K219 Gastro-esophageal reflux disease without esophagitis: Secondary | ICD-10-CM | POA: Diagnosis not present

## 2012-02-18 DIAGNOSIS — Z1211 Encounter for screening for malignant neoplasm of colon: Secondary | ICD-10-CM | POA: Diagnosis not present

## 2012-02-18 DIAGNOSIS — E785 Hyperlipidemia, unspecified: Secondary | ICD-10-CM | POA: Diagnosis not present

## 2012-02-25 ENCOUNTER — Telehealth: Payer: Self-pay | Admitting: Pulmonary Disease

## 2012-02-25 ENCOUNTER — Ambulatory Visit (INDEPENDENT_AMBULATORY_CARE_PROVIDER_SITE_OTHER): Payer: Medicare Other | Admitting: Pulmonary Disease

## 2012-02-25 ENCOUNTER — Encounter: Payer: Self-pay | Admitting: Pulmonary Disease

## 2012-02-25 VITALS — BP 112/68 | HR 79 | Temp 98.5°F | Ht 62.75 in | Wt 176.0 lb

## 2012-02-25 DIAGNOSIS — G4733 Obstructive sleep apnea (adult) (pediatric): Secondary | ICD-10-CM | POA: Diagnosis not present

## 2012-02-25 NOTE — Patient Instructions (Addendum)
Will have your dme check the pressure on your machine with a device to ensure it is delivering accurately. Will put you on an auto device for 2 weeks to optimize your pressure.  I will call you once I receive your download. Call us in Dec when it is time to replace your machine.  Work on [employer] loss Keep followup with me.

## 2012-02-25 NOTE — Assessment & Plan Note (Signed)
The patient is wearing CPAP compliantly, and is having no issues with tolerance.  However, she does not feel she is resting well, and has some increased in daytime symptoms.  I will have her device checked with a pressure manometer, but if working well, she will need optimization of her pressure again on an automatic device.

## 2012-02-25 NOTE — Progress Notes (Signed)
  Subjective:    Patient ID: Traci Hall, female    DOB: 04-29-1946, 66 y.o.   MRN: [id number]  HPI Patient comes in today for followup of her obstructive sleep apnea.  Despite wearing CPAP religiously, she does not feel as rested as she has been in the past, and is having some inappropriate daytime sleepiness.  She uses a nasal mask, but denies mouth opening.  She has kept up with mask changes.  She is unsure if this is a problem with her CPAP machine which is quite old, or if she may need higher pressures.   Review of Systems  Constitutional: Negative.  Negative for fever and unexpected weight change.  HENT: Positive for congestion, sneezing and sinus pressure. Negative for ear pain, nosebleeds, sore throat, rhinorrhea, trouble swallowing, dental problem and postnasal drip.   Eyes: Positive for redness ( uses eye drops). Negative for itching.  Respiratory: Positive for shortness of breath and wheezing. Negative for cough and chest tightness.   Cardiovascular: Positive for leg swelling. Negative for palpitations.  Gastrointestinal: Negative for nausea and vomiting.  Genitourinary: Negative for dysuria.  Musculoskeletal: Negative for joint swelling.  Skin: Negative for rash.  Neurological: Negative for headaches.  Hematological: Does not bruise/bleed easily.  Psychiatric/Behavioral: Negative for dysphoric mood. The patient is not nervous/anxious.        Objective:   Physical Exam Overweight female in no acute distress Skin breakdown or pressure necrosis from the CPAP mask Mild lower extremity edema, no cyanosis noted Alert and oriented, does not appear sleepy, moves all 4 extremities.       Assessment & Plan:

## 2012-02-25 NOTE — Telephone Encounter (Signed)
Traci Hall, this pt received a letter from her dme stating that medicare is now requiring sleep apnea pts be seen twice a  Year minimum!! Is this true or just propaganda?  Thanks.

## 2012-03-04 ENCOUNTER — Encounter: Payer: Self-pay | Admitting: Internal Medicine

## 2012-03-07 LAB — HM COLONOSCOPY

## 2012-03-11 NOTE — Telephone Encounter (Signed)
lmom for pt to call back and ask for [employer]

## 2012-03-14 NOTE — Telephone Encounter (Signed)
Spoke to pt and she will email letter from home care company so we can read it for ourselves, pt states no need to call back unless she needs to do something different with her appointments. Told pt we would take care of calling company and if anything further was needed we would call her.--pt verbalized understanding

## 2012-05-03 ENCOUNTER — Telehealth: Payer: Self-pay | Admitting: Pulmonary Disease

## 2012-05-03 NOTE — Telephone Encounter (Signed)
Called, spoke with pt.  Informed her of below per Dr. Shelle Iron.  She verbalized understanding but states she has found it difficult to tolerate the pressure at 10 because it bothers her right ear.  Pt states she's had a ruptured ear drum before.  States her cpap is currently set at 7.  States she can tolerate the pressure up to 8 or 9 but not 10.  Dr. Shelle Iron, pls advise.  Thank you.  Pt also stated she will be getting a new machine in Dec.   **  Pt fine with call back tomorrow.

## 2012-05-03 NOTE — Telephone Encounter (Signed)
Please let pt know that her optimal pressure is 10cm.  I think that is the set pressure currently, but can send order to dme.

## 2012-05-03 NOTE — Telephone Encounter (Signed)
We did the study because she felt she was not sleeping as well, and not as alert during day.  I am ok with any pressure between 7-10, and can try any of those levels that she is comfortable with.

## 2012-05-03 NOTE — Telephone Encounter (Signed)
CPAP download located and placed in results folder by Selena Batten - pt is aware.  Dr Shelle Iron please advise of results for pt, thanks.  Pt requests a coy be mailed to her home address: [address] [place] [address]

## 2012-05-04 NOTE — Telephone Encounter (Signed)
Pt returned call.  Advised of KC's recs as stated below.  Pt stated that she would like to hold off on changing her CPAP pressure until she receives her new CPAP machine in December to avoid multiple visits from her DME company (they are based in [place] and she lives in [place]).  Pt stated that she will call back approx Dec 10 to request the order for new CPAP machine (will qualify at that time per Medicare) and to increase her pressure setting.  Nothing further needed at this time; will sign and forward to [facility] as FYI.

## 2012-05-04 NOTE — Telephone Encounter (Signed)
[email or web address]  

## 2012-05-19 ENCOUNTER — Ambulatory Visit (INDEPENDENT_AMBULATORY_CARE_PROVIDER_SITE_OTHER): Payer: Medicare Other | Admitting: Internal Medicine

## 2012-05-19 ENCOUNTER — Encounter: Payer: Self-pay | Admitting: Internal Medicine

## 2012-05-19 VITALS — BP 136/72 | HR 86 | Temp 98.1°F | Resp 12 | Ht 62.5 in | Wt 173.2 lb

## 2012-05-19 DIAGNOSIS — E538 Deficiency of other specified B group vitamins: Secondary | ICD-10-CM

## 2012-05-19 DIAGNOSIS — L989 Disorder of the skin and subcutaneous tissue, unspecified: Secondary | ICD-10-CM

## 2012-05-19 DIAGNOSIS — K7689 Other specified diseases of liver: Secondary | ICD-10-CM | POA: Diagnosis not present

## 2012-05-19 DIAGNOSIS — J449 Chronic obstructive pulmonary disease, unspecified: Secondary | ICD-10-CM

## 2012-05-19 DIAGNOSIS — G4733 Obstructive sleep apnea (adult) (pediatric): Secondary | ICD-10-CM | POA: Diagnosis not present

## 2012-05-19 DIAGNOSIS — K7581 Nonalcoholic steatohepatitis (NASH): Secondary | ICD-10-CM

## 2012-05-19 DIAGNOSIS — E559 Vitamin D deficiency, unspecified: Secondary | ICD-10-CM | POA: Diagnosis not present

## 2012-05-19 LAB — VITAMIN B12: Vitamin B-12: 478 pg/mL (ref 211–911)

## 2012-05-19 LAB — COMPREHENSIVE METABOLIC PANEL
Albumin: 4.1 g/dL (ref 3.5–5.2)
Alkaline Phosphatase: 75 U/L (ref 39–117)
CO2: 30 mEq/L (ref 19–32)
Glucose, Bld: 141 mg/dL — ABNORMAL HIGH (ref 70–99)
Potassium: 3.7 mEq/L (ref 3.5–5.1)
Sodium: 140 mEq/L (ref 135–145)
Total Protein: 7.3 g/dL (ref 6.0–8.3)

## 2012-05-19 MED ORDER — TETANUS-DIPHTH-ACELL PERTUSSIS 5-2.5-18.5 LF-MCG/0.5 IM SUSP: 0.5000 mL | Freq: Once | INTRAMUSCULAR | Status: DC

## 2012-05-19 NOTE — Assessment & Plan Note (Addendum)
Wakes up tired .  Occasionally dozes in the afternoon. Has a new adopted cat which wakes her up at 4 am almost daily. Using CPAP every night.  Dr. Shelle Iron did a pressure test and has ordered a new machine which will be set with her new pressure requirements.

## 2012-05-19 NOTE — Assessment & Plan Note (Addendum)
Her ALT elevation has improved. Discussed the role of weight loss in control of cholesterol and hyperglycemia in managing NASH.  She plans to join [employer]. Low glycemic index diet recommended.

## 2012-05-19 NOTE — Patient Instructions (Addendum)
This is  my version of a  "Low GI"  Diet:  All of the foods can be found at grocery stores and in bulk at [employer].  The Atkins protein bars and shakes are available in more varieties at [employer], [employer] and [employer].     7 AM Breakfast:  Low carbohydrate Protein  Shakes (I recommend the EAS AdvantEdge "Carb Control" shakes  Or the low carb shakes by Atkins.   Both are available everywhere:  In  cases at BJs  Or in 4 packs at grocery stores and pharmacies  2.5 carbs  (Alternative is  a toasted Arnold's Sandwhich Thin w/ peanut butter, a "Bagel Thin" with cream cheese and salmon) or  a scrambled egg burrito made with a low carb tortilla .  Avoid cereal and bananas, oatmeal too unless you are cooking the old fashioned kind that takes 30-40 minutes to prepare.  the rest is overly processed, has minimal fiber, and is loaded with carbohydrates!   10 AM: Protein bar by Atkins (the snack size, under 200 cal).  There are many varieties , available widely again or in bulk in limited varieties at BJs)  Other so called "protein bars" tend to be loaded with carbohydrates.  Remember, in food advertising, the word "energy" is synonymous for " carbohydrate."  Lunch: sandwich of turkey, (or any lunchmeat, grilled meat or canned tuna), fresh avocado, mayonnaise  and cheese on a lower carbohydrate pita bread, flatbread, or tortilla . Ok to use regular mayonnaise. The bread is the only source or carbohydrate that can be decreased (Joseph's makes a pita bread and a flat bread that are 50 cal and 4 net carbs ; Toufayan makes a low carb flatbread that's 100 cal and 9 net carbs  and  Mission makes a low carb whole wheat tortilla  That is 210 cal and 6 net carbs)  3 PM:  Mid day :  Another protein bar,  Or a  cheese stick (100 cal, 0 carbs),  Or 1 ounce of  almonds, walnuts, pistachios, pecans, peanuts,  Macadamia nuts. Or a Dannon light n Fit greek yogurt, 80 cal 8 net carbs . Avoid "granola"; the dried cranberries and  raisins are loaded with carbohydrates. Mixed nuts ok if no raisins or cranberries or dried fruit.      6 PM  Dinner:  "mean and green:"  Meat/chicken/fish or a high protein legume; , with a green salad, and a low GI  Veggie (broccoli, cauliflower, green beans, spinach, brussel sprouts. Lima beans) : Avoid "Low fat dressings, as well as [place] and [place]! They are loaded with sugar! Instead use ranch, vinagrette,  Blue cheese, etc.  There is a low carb pasta by Dreamfield's available at [employer] grocery that is acceptable and tastes great. Try Michel Angel's chicken piccata over low carb pasta. The chicken dish is 0 carbs, and can be found in frozen section at BJs and [employer]. Also try Aaron Sanchez's "Carnitas" (pulled pork, no sauce,  0 carbs) and his pot roast.   both are in the refrigerated section at BJs   9 PM snack : Breyer's "low carb" fudgsicle or  ice cream bar (Carb Smart line), or  Weight Watcher's ice cream bar , or another "no sugar added" ice cream;a serving of fresh berries/cherries with whipped cream (Avoid bananas, pineapple, grapes  and watermelon on a regular basis because they are high in sugar)   Remember that snack Substitutions should be less than 15 to   20 carbs  Per serving. Remember to subtract fiber grams and sugar alcohols to get the "net carbs."   

## 2012-05-19 NOTE — Progress Notes (Signed)
Patient ID: Traci Hall, female   DOB: [birth year], 66 y.o.   MRN: [id number]  Patient Active Problem List  Diagnosis  . OSA (obstructive sleep apnea)  . COPD (chronic obstructive pulmonary disease)  . Rhinitis, allergic  . NASH (nonalcoholic steatohepatitis)    Subjective:  CC:   Chief Complaint  Patient presents with  . Follow-up    HPI:   Traci Douglass Mountainis a 66 y.o. female who presents for 6 month follow up on COPD, OSA  And NASH.  She has been somewhat overwhelmed by her husband's health issues and cognitive decline and has been managing all of the household affairs since his dementia was diagnosed.  Her daughter has been supportive, and her relationship with her husband is very honest and nurturing.  She is sleeping well,  Using her CPAP regularly but is woken frequently by a new kitten.  Recent sleep study was done and her pressure parameters were increased but she is waiting for her new machine,  She does take a daytime nap.  She is not  exercising daily or losing weight.   Past Medical History  Diagnosis Date  . Asthma   . Allergic rhinitis   . OSA on CPAP   . COPD (chronic obstructive pulmonary disease)     by prior PFTs  . Hyperlipidemia     Past Surgical History  Procedure Date  . Nasal sinus surgery 2008  . Appendectomy 1977  . Abdominal hysterectomy 1991    secondary to endometriosis, and polyps         The following portions of the patient's history were reviewed and updated as appropriate: Allergies, current medications, and problem list.    Review of Systems:   12 Pt  review of systems was negative except those addressed in the HPI,     History   Social History  . Marital Status: Married    Spouse Name: N/A    Number of Children: 2  . Years of Education: N/A   Occupational History  . retired     Social History Main Topics  . Smoking status: Former Smoker -- 1.5 packs/day for 45 years    Types: Cigarettes    Quit date:  06/15/2005  . Smokeless tobacco: Never Used  . Alcohol Use: Yes  . Drug Use: No  . Sexually Active: Not on file   Other Topics Concern  . Not on file   Social History Narrative  . No narrative on file    Objective:  BP 136/72  Pulse 86  Temp 98.1 F (36.7 C) (Oral)  Resp 12  Ht 5' 2.5" (1.588 m)  Wt 173 lb 4 oz (78.586 kg)  BMI 31.18 kg/m2  SpO2 98%  General appearance: alert, cooperative and appears stated age Ears: normal TM's and external ear canals both ears Throat: lips, mucosa, and tongue normal; teeth and gums normal Neck: no adenopathy, no carotid bruit, supple, symmetrical, trachea midline and thyroid not enlarged, symmetric, no tenderness/mass/nodules Back: symmetric, no curvature. ROM normal. No CVA tenderness. Lungs: clear to auscultation bilaterally Heart: regular rate and rhythm, S1, S2 normal, no murmur, click, rub or gallop Abdomen: soft, non-tender; bowel sounds normal; no masses,  no organomegaly Pulses: 2+ and symmetric Skin: Skin color, texture, turgor normal. No rashes or lesions Lymph nodes: Cervical, supraclavicular, and axillary nodes normal.  Assessment and Plan:  OSA (obstructive sleep apnea)  Wakes up tired .  Occasionally dozes in the afternoon. Has a new adopted cat which wakes  her up at 4 am almost daily. Using CPAP every night.  Dr. Shelle Iron did a pressure test and has ordered a new machine which will be set with her new pressure requirements.   NASH (nonalcoholic steatohepatitis) Her ALT elevation has improved. Discussed the role of weight loss in control of cholesterol and hyperglycemia in managing NASH.  She plans to join [employer]. Low glycemic index diet recommended.   COPD (chronic obstructive pulmonary disease) Currently asymptomatic with current medication regimen.    Updated Medication List Outpatient Encounter Prescriptions as of 05/19/2012  Medication Sig Dispense Refill  . albuterol (PROVENTIL HFA) 108 (90 BASE) MCG/ACT  inhaler Inhale 2 puffs into the lungs every 4 (four) hours as needed for wheezing. 2 puffs every am, and then as needed  2 Inhaler  2  . aspirin 81 MG tablet Take 81 mg by mouth daily.      . Bimatoprost (LUMIGAN OP) Apply to eye. 1 drop in each eye pm       . desloratadine (CLARINEX) 5 MG tablet Take 1 tablet (5 mg total) by mouth daily.  90 tablet  2  . Ergocalciferol (VITAMIN D2 PO) Take 1,000 Units by mouth daily.       . famotidine (PEPCID) 20 MG tablet Take 40 mg by mouth 2 (two) times daily.       . Fluticasone Propionate, Inhal, (FLOVENT DISKUS) 100 MCG/BLIST AEPB Inhale 1 puff into the lungs 2 (two) times daily.  180 each  2  . hydrocortisone (ANUSOL-HC) 25 MG suppository Place 25 mg rectally. Prn       . Multiple Vitamins-Minerals (CENTRUM SILVER PO) Take by mouth. Take 1 am       . olopatadine (PATANOL) 0.1 % ophthalmic solution 1 drop 2 (two) times daily.      [name] Glycol-Propyl Glycol (SYSTANE) 0.4-0.3 % SOLN As directed when needed      . tiotropium (SPIRIVA) 18 MCG inhalation capsule Place 1 capsule (18 mcg total) into inhaler and inhale daily.  90 capsule  2  . vitamin C (ASCORBIC ACID) 500 MG tablet Take 500 mg by mouth daily.      . vitamin E (VITAMIN E) 400 UNIT capsule Take 400 Units by mouth daily.      Marland Kitchen zolpidem (AMBIEN) 10 MG tablet Take 1 tablet (10 mg total) by mouth at bedtime as needed.  30 tablet  3  . TDaP (BOOSTRIX) 5-2.5-18.5 LF-MCG/0.5 injection Inject 0.5 mLs into the muscle once.  0.5 mL  0     Orders Placed This Encounter  Procedures  . Comprehensive metabolic panel  . Vitamin B12  . Vitamin D 25 hydroxy  . Ambulatory referral to Dermatology    No Follow-up on file.

## 2012-05-21 ENCOUNTER — Encounter: Payer: Self-pay | Admitting: Internal Medicine

## 2012-05-21 NOTE — Assessment & Plan Note (Signed)
Currently asymptomatic with current medication regimen.

## 2012-05-24 ENCOUNTER — Ambulatory Visit (INDEPENDENT_AMBULATORY_CARE_PROVIDER_SITE_OTHER)
Admission: RE | Admit: 2012-05-24 | Discharge: 2012-05-24 | Disposition: A | Discharge status: Home / Self Care | Payer: Medicare Other | Source: Ambulatory Visit | Attending: Pulmonary Disease | Admitting: Pulmonary Disease

## 2012-05-24 ENCOUNTER — Encounter: Payer: Self-pay | Admitting: Pulmonary Disease

## 2012-05-24 ENCOUNTER — Ambulatory Visit (INDEPENDENT_AMBULATORY_CARE_PROVIDER_SITE_OTHER): Payer: Medicare Other | Admitting: Pulmonary Disease

## 2012-05-24 VITALS — BP 110/80 | HR 98 | Temp 98.2°F | Ht 68.0 in | Wt 171.0 lb

## 2012-05-24 DIAGNOSIS — R079 Chest pain, unspecified: Secondary | ICD-10-CM

## 2012-05-24 DIAGNOSIS — J4489 Other specified chronic obstructive pulmonary disease: Secondary | ICD-10-CM

## 2012-05-24 DIAGNOSIS — J449 Chronic obstructive pulmonary disease, unspecified: Secondary | ICD-10-CM | POA: Diagnosis not present

## 2012-05-24 NOTE — Assessment & Plan Note (Signed)
She describes a constant chest burning which is not characteristic of any particular syndrome.  Her lungs are clear and she does not appear to have a pulmonary infection.  Her EKG today showed no new changes and it does not sound like angina.  She has had breast inflammation and a new lump on that side lately, so I think this the two are related.  Possibly an obstructed or inflamed duct?  Plan: -check a CXR today -f/u with Dr. Darrick Huntsman tomorrow -I instructed her to use warm compresses to her breast 3-4 times a day -if no improvement, then consider CT chest vs stress test

## 2012-05-24 NOTE — Progress Notes (Signed)
Subjective:    Patient ID: Traci Hall, female    DOB: 17-Aug-1945, 66 y.o.   MRN: [id number]  Synopsis: Ms. Traci Hall first saw [facility] in 08/2011 for shortness of breath.  She was diagnosed with Grade A COPD.  She previously smoked 1.5 ppd for 45 years.  She has allergic rhinitis as well.  HPI   11/16/11 ROV -- [name] has been doing very well.  She only had to use her albuterol a few times in the last few months due to chest congestion, but really has not had any trouble breathing.  She feels that she could stop the singulair.  She is taking the flovent once per day.  She continues to use the spiriva once per day.  05/24/2012 -- [name] started using the Flovent at night a few months ago when she was having more chest congestion.  It doesn't sound like the flovent helped consistently, but when she started using the singulair again things really cleared up.  She was having a lot of sinus congestion as well.  This was back in the fall. She continues to use the Spiriva daily and she has not had bronchitis since using it.   She has been having a burning sensation in the R chest for the last two weeks.  It is distinct from her GERD symptoms. It is constant and not exacerbated by deep breathing or exercise.  She does not have new cough or dyspnea.  She has some swelling and redness in her breast since a shopping trip a week ago.  She is to see Dr. Darrick Huntsman tomorrow for this.   Past Medical History  Diagnosis Date  . Asthma   . Allergic rhinitis   . OSA on CPAP   . COPD (chronic obstructive pulmonary disease)     by prior PFTs  . Hyperlipidemia       Review of Systems  Constitutional: Negative for fever, chills and unexpected weight change.  HENT: Negative for ear pain, nosebleeds, rhinorrhea and postnasal drip.   Respiratory: Negative for cough, choking, chest tightness and shortness of breath.   Cardiovascular: Negative for chest pain and leg swelling.        Objective:   Physical Exam   Filed Vitals:   05/24/12 0937  BP: 110/80  Pulse: 98  Temp: 98.2 F (36.8 C)  TempSrc: Oral  Height: 5\' 8"  (1.727 m)  Weight: 171 lb (77.565 kg)  SpO2: 98%   Gen: well appearing, no acute distress HEENT: NCAT, PERRL, EOMi, OP clear, neck supple without masses PULM: CTA B, no wheezing CV: RRR, no mgr, no JVD AB: BS+, soft, nontender, no hsm Ext: warm, no edema, no clubbing, no cyanosis  08/2011 Simple spirometry: F/F ratio 67% FEV1 1.74L (79% pred)     Assessment & Plan:   COPD (chronic obstructive pulmonary disease) This has been a stable interval for Traci Hall.  Her chest congestion is worse in the high allergy seasons, so she can use the Flovent and Singulair during those months.  She needs to take the Spiriva daily (we discussed this at length today).  Plan: -Flu shot UTD 2013 -continue spiriva -continue Singulair and Flovent in spring and fall -rtc in 6 months  Chest pain She describes a constant chest burning which is not characteristic of any particular syndrome.  Her lungs are clear and she does not appear to have a pulmonary infection.  Her EKG today showed no new changes and it does not sound like  angina.  She has had breast inflammation and a new lump on that side lately, so I think this the two are related.  Possibly an obstructed or inflamed duct?  Plan: -check a CXR today -f/u with Dr. Darrick Huntsman tomorrow -I instructed her to use warm compresses to her breast 3-4 times a day -if no improvement, then consider CT chest vs stress test    Updated Medication List Outpatient Encounter Prescriptions as of 05/24/2012  Medication Sig Dispense Refill  . albuterol (PROVENTIL HFA;VENTOLIN HFA) 108 (90 BASE) MCG/ACT inhaler 2 puffs every am, and then as needed      . aspirin 81 MG tablet Take 81 mg by mouth daily.      . Bimatoprost (LUMIGAN OP) Apply to eye. 1 drop in each eye pm       . desloratadine (CLARINEX) 5 MG tablet Take 1 tablet (5  mg total) by mouth daily.  90 tablet  2  . Ergocalciferol (VITAMIN D2 PO) Take 1,000 Units by mouth daily.       . famotidine (PEPCID) 20 MG tablet Take 40 mg by mouth 2 (two) times daily.       . Fluticasone Propionate, Inhal, 100 MCG/BLIST AEPB Inhale 1 puff into the lungs every morning.      . hydrocortisone (ANUSOL-HC) 25 MG suppository Place 25 mg rectally. Prn       . Multiple Vitamins-Minerals (CENTRUM SILVER PO) Take by mouth. Take 1 am       . olopatadine (PATANOL) 0.1 % ophthalmic solution 1 drop 2 (two) times daily.      [name] Glycol-Propyl Glycol (SYSTANE) 0.4-0.3 % SOLN As directed when needed      . tiotropium (SPIRIVA) 18 MCG inhalation capsule Place 1 capsule (18 mcg total) into inhaler and inhale daily.  90 capsule  2  . vitamin C (ASCORBIC ACID) 500 MG tablet Take 500 mg by mouth daily.      . vitamin E (VITAMIN E) 400 UNIT capsule Take 400 Units by mouth daily.      Marland Kitchen zolpidem (AMBIEN) 10 MG tablet Take 1 tablet (10 mg total) by mouth at bedtime as needed.  30 tablet  3  . [DISCONTINUED] albuterol (PROVENTIL HFA) 108 (90 BASE) MCG/ACT inhaler Inhale 2 puffs into the lungs every 4 (four) hours as needed for wheezing. 2 puffs every am, and then as needed  2 Inhaler  2  . [DISCONTINUED] Fluticasone Propionate, Inhal, (FLOVENT DISKUS) 100 MCG/BLIST AEPB Inhale 1 puff into the lungs 2 (two) times daily.  180 each  2  . [DISCONTINUED] TDaP (BOOSTRIX) 5-2.5-18.5 LF-MCG/0.5 injection Inject 0.5 mLs into the muscle once.  0.5 mL  0

## 2012-05-24 NOTE — Patient Instructions (Signed)
We will order a Chest x-ray to evaluate your chest pain Follow up with Dr. Darrick Huntsman tomorrow Continue the spiriva daily; use the flovent if your chest congestion picks up in the high allergy seasons Use the singulair when your allergies are worse Let us or Dr. Elpidio Eric know if the chest burning does not get better with applying warm compresses to your breast We will see you back in 6 months or sooner if needed

## 2012-05-24 NOTE — Assessment & Plan Note (Signed)
This has been a stable interval for Traci Hall.  Her chest congestion is worse in the high allergy seasons, so she can use the Flovent and Singulair during those months.  She needs to take the Spiriva daily (we discussed this at length today).  Plan: -Flu shot UTD 2013 -continue spiriva -continue Singulair and Flovent in spring and fall -rtc in 6 months

## 2012-05-24 NOTE — Progress Notes (Signed)
Quick Note:  Spoke with pt and notified of results per Dr. McQuaid. Pt verbalized understanding and denied any questions.  ______ 

## 2012-05-25 ENCOUNTER — Encounter: Payer: Self-pay | Admitting: Internal Medicine

## 2012-05-25 ENCOUNTER — Ambulatory Visit: Payer: Medicare Other | Admitting: Internal Medicine

## 2012-05-25 ENCOUNTER — Ambulatory Visit (INDEPENDENT_AMBULATORY_CARE_PROVIDER_SITE_OTHER): Payer: Medicare Other | Admitting: Internal Medicine

## 2012-05-25 VITALS — BP 144/78 | HR 75 | Temp 98.7°F | Resp 12 | Ht 62.5 in | Wt 173.5 lb

## 2012-05-25 DIAGNOSIS — Z13220 Encounter for screening for lipoid disorders: Secondary | ICD-10-CM | POA: Diagnosis not present

## 2012-05-25 DIAGNOSIS — R5383 Other fatigue: Secondary | ICD-10-CM

## 2012-05-25 DIAGNOSIS — R5381 Other malaise: Secondary | ICD-10-CM

## 2012-05-25 DIAGNOSIS — N61 Mastitis without abscess: Secondary | ICD-10-CM | POA: Insufficient documentation

## 2012-05-25 DIAGNOSIS — E785 Hyperlipidemia, unspecified: Secondary | ICD-10-CM | POA: Diagnosis not present

## 2012-05-25 DIAGNOSIS — R030 Elevated blood-pressure reading, without diagnosis of hypertension: Secondary | ICD-10-CM

## 2012-05-25 NOTE — Progress Notes (Signed)
Patient ID: Traci Hall, female   DOB: 02/22/1946, 66 y.o.   MRN: [id number]  Patient Active Problem List  Diagnosis  . OSA (obstructive sleep apnea)  . COPD (chronic obstructive pulmonary disease)  . Rhinitis, allergic  . NASH (nonalcoholic steatohepatitis)  . Chest pain  . Breast inflammation    Subjective:  CC:   Chief Complaint  Patient presents with  . Breast Pain    HPI:   Traci Hall a 66 y.o. female who presents  Past Medical History  Diagnosis Date  . Asthma   . Allergic rhinitis   . OSA on CPAP   . COPD (chronic obstructive pulmonary disease)     by prior PFTs  . Hyperlipidemia     Past Surgical History  Procedure Date  . Nasal sinus surgery 2008  . Appendectomy 1977  . Abdominal hysterectomy 1991    secondary to endometriosis, and polyps         The following portions of the patient's history were reviewed and updated as appropriate: Allergies, current medications, and problem list.    Review of Systems:   12 Pt  review of systems was negative except those addressed in the HPI,     History   Social History  . Marital Status: Married    Spouse Name: N/A    Number of Children: 2  . Years of Education: N/A   Occupational History  . retired     Social History Main Topics  . Smoking status: Former Smoker -- 1.5 packs/day for 45 years    Types: Cigarettes    Quit date: 06/15/2005  . Smokeless tobacco: Never Used  . Alcohol Use: Yes  . Drug Use: No  . Sexually Active: Not on file   Other Topics Concern  . Not on file   Social History Narrative  . No narrative on file    Objective:  BP 144/78  Pulse 75  Temp 98.7 F (37.1 C) (Oral)  Resp 12  Ht 5' 2.5" (1.588 m)  Wt 173 lb 8 oz (78.699 kg)  BMI 31.23 kg/m2  SpO2 97%  General appearance: alert, cooperative and appears stated age Neck: no adenopathy, no carotid bruit, supple, symmetrical, trachea midline and thyroid not enlarged, symmetric, no  tenderness/mass/nodules Heart: regular rate and rhythm, S1, S2 normal, no murmur, click, rub or gallop Breast: right breast without masse,  Erythema or nipple involution but is more full on the medial side compared to left.  Abdomen: soft, non-tender; bowel sounds normal; no masses,  no organomegaly Pulses: 2+ and symmetric Skin: Skin color, texture, turgor normal. No rashes or lesions Lymph nodes: Cervical, supraclavicular, and axillary nodes normal.  Assessment and Plan:  Breast inflammation Right breast medial side appears more full on exam without a discrete mass.  Diagnostic  Mammogram ordered.   Updated Medication List Outpatient Encounter Prescriptions as of 05/25/2012  Medication Sig Dispense Refill  . albuterol (PROVENTIL HFA;VENTOLIN HFA) 108 (90 BASE) MCG/ACT inhaler 2 puffs every am, and then as needed      . aspirin 81 MG tablet Take 81 mg by mouth daily.      . Bimatoprost (LUMIGAN OP) Apply to eye. 1 drop in each eye pm       . desloratadine (CLARINEX) 5 MG tablet Take 1 tablet (5 mg total) by mouth daily.  90 tablet  2  . Ergocalciferol (VITAMIN D2 PO) Take 1,000 Units by mouth daily.       . famotidine (PEPCID)  20 MG tablet Take 40 mg by mouth 2 (two) times daily.       . Fluticasone Propionate, Inhal, 100 MCG/BLIST AEPB Inhale 1 puff into the lungs every morning.      . hydrocortisone (ANUSOL-HC) 25 MG suppository Place 25 mg rectally. Prn       . Multiple Vitamins-Minerals (CENTRUM SILVER PO) Take by mouth. Take 1 am       . olopatadine (PATANOL) 0.1 % ophthalmic solution 1 drop 2 (two) times daily.      [name] Glycol-Propyl Glycol (SYSTANE) 0.4-0.3 % SOLN As directed when needed      . tiotropium (SPIRIVA) 18 MCG inhalation capsule Place 1 capsule (18 mcg total) into inhaler and inhale daily.  90 capsule  2  . vitamin C (ASCORBIC ACID) 500 MG tablet Take 500 mg by mouth daily.      . vitamin E (VITAMIN E) 400 UNIT capsule Take 400 Units by mouth daily.      Marland Kitchen  zolpidem (AMBIEN) 10 MG tablet Take 1 tablet (10 mg total) by mouth at bedtime as needed.  30 tablet  3     Orders Placed This Encounter  Procedures  . MM Digital Diagnostic Unilat R    No Follow-up on file.

## 2012-05-25 NOTE — Assessment & Plan Note (Addendum)
Right breast medial side appears more full on exam without a discrete mass.  Diagnostic  Mammogram ordered.

## 2012-05-25 NOTE — Patient Instructions (Addendum)
If your mammogram is normal and the redness returns,  Call us back and we will prescribe you an antibiotic to take for 10 days to cover a skin infection

## 2012-05-26 ENCOUNTER — Ambulatory Visit: Payer: Medicare Other | Admitting: Internal Medicine

## 2012-05-26 DIAGNOSIS — N6489 Other specified disorders of breast: Secondary | ICD-10-CM | POA: Diagnosis not present

## 2012-05-26 DIAGNOSIS — R922 Inconclusive mammogram: Secondary | ICD-10-CM | POA: Diagnosis not present

## 2012-05-26 DIAGNOSIS — N63 Unspecified lump in unspecified breast: Secondary | ICD-10-CM | POA: Diagnosis not present

## 2012-05-26 DIAGNOSIS — L539 Erythematous condition, unspecified: Secondary | ICD-10-CM | POA: Diagnosis not present

## 2012-05-26 DIAGNOSIS — N644 Mastodynia: Secondary | ICD-10-CM | POA: Diagnosis not present

## 2012-05-28 ENCOUNTER — Encounter: Payer: Self-pay | Admitting: Internal Medicine

## 2012-06-17 ENCOUNTER — Other Ambulatory Visit: Payer: Self-pay | Admitting: *Deleted

## 2012-06-17 MED ORDER — DESLORATADINE 5 MG PO TABS: 5.0000 mg | ORAL_TABLET | Freq: Every day | ORAL | Status: DC

## 2012-06-17 MED ORDER — TIOTROPIUM BROMIDE MONOHYDRATE 18 MCG IN CAPS: 18.0000 ug | ORAL_CAPSULE | Freq: Every day | RESPIRATORY_TRACT | Status: DC

## 2012-06-22 ENCOUNTER — Encounter: Payer: Self-pay | Admitting: Internal Medicine

## 2012-07-05 ENCOUNTER — Telehealth: Payer: Self-pay | Admitting: Pulmonary Disease

## 2012-07-05 DIAGNOSIS — G4733 Obstructive sleep apnea (adult) (pediatric): Secondary | ICD-10-CM

## 2012-07-05 NOTE — Telephone Encounter (Signed)
I spoke with pt. Advised her of KC recs. A download was done. If look at 05/03/12 phone note recs were to increase pressure to 10 cm. Please advise KC thanks

## 2012-07-05 NOTE — Telephone Encounter (Signed)
Spoke with patient. States it is time to replace her CPAP machine.    02/25/12 Patient Instructions     Will have your dme check the pressure on your machine with a device to ensure it is delivering accurately.  Will put you on an auto device for 2 weeks to optimize your pressure. I will call you once I receive your download.  Call us in Dec when it is time to replace your machine.  Work on [employer] loss  Keep followup with me.   Patient states that when test was done to see where to set pressure at, it was to be set at 10.  However patient states she just can not tolerate 10 d/t an ear problem.  Patient is requesting setting be placed between 7-10 preferably 8.5 or 9.  Order has not been placed for new machine yet, will await to see what setting Dr. Shelle Iron will set machine at.  Dr. Shelle Iron please advise at what pressure you would like patients machine set at.

## 2012-07-05 NOTE — Telephone Encounter (Signed)
I do not see where we ever sent the order to put on set pressure.  That usually means we never got the download? Would like a copy of the download from last year to review again.  Let pt know will call her once I receive this.  She needs to get her ear fixed, because this can interfere with cpap tolerance. Marland Kitchen

## 2012-07-05 NOTE — Telephone Encounter (Signed)
Now seen.  Would put in order for her to get new machine, and set pressure on 8cm.

## 2012-07-06 ENCOUNTER — Telehealth: Payer: Self-pay | Admitting: Pulmonary Disease

## 2012-07-06 NOTE — Telephone Encounter (Signed)
Pt aware of KC recs. She voiced her understanding. Order has been sent

## 2012-07-06 NOTE — Telephone Encounter (Signed)
Per [facility], order has not been received and they need this faxed over to them, attn:  [place]. [phone]. They also said the doctors NPI# needs to be on the order/prescription. Will forward to the PCC's.

## 2012-07-07 NOTE — Telephone Encounter (Signed)
Order refaxed npi# on it Tobe Sos

## 2012-07-12 DIAGNOSIS — Z85828 Personal history of other malignant neoplasm of skin: Secondary | ICD-10-CM | POA: Diagnosis not present

## 2012-07-12 DIAGNOSIS — D1739 Benign lipomatous neoplasm of skin and subcutaneous tissue of other sites: Secondary | ICD-10-CM | POA: Diagnosis not present

## 2012-07-12 DIAGNOSIS — L259 Unspecified contact dermatitis, unspecified cause: Secondary | ICD-10-CM | POA: Diagnosis not present

## 2012-07-12 DIAGNOSIS — L821 Other seborrheic keratosis: Secondary | ICD-10-CM | POA: Diagnosis not present

## 2012-07-12 DIAGNOSIS — D239 Other benign neoplasm of skin, unspecified: Secondary | ICD-10-CM | POA: Diagnosis not present

## 2012-07-12 DIAGNOSIS — D2330 Other benign neoplasm of skin of unspecified part of face: Secondary | ICD-10-CM | POA: Diagnosis not present

## 2012-07-27 DIAGNOSIS — K7689 Other specified diseases of liver: Secondary | ICD-10-CM | POA: Diagnosis not present

## 2012-07-27 DIAGNOSIS — K219 Gastro-esophageal reflux disease without esophagitis: Secondary | ICD-10-CM | POA: Diagnosis not present

## 2012-08-17 ENCOUNTER — Ambulatory Visit: Payer: Self-pay | Admitting: Gastroenterology

## 2012-08-17 DIAGNOSIS — J310 Chronic rhinitis: Secondary | ICD-10-CM | POA: Diagnosis not present

## 2012-08-17 DIAGNOSIS — J449 Chronic obstructive pulmonary disease, unspecified: Secondary | ICD-10-CM | POA: Diagnosis not present

## 2012-08-17 DIAGNOSIS — K7689 Other specified diseases of liver: Secondary | ICD-10-CM | POA: Diagnosis not present

## 2012-08-17 DIAGNOSIS — Z87891 Personal history of nicotine dependence: Secondary | ICD-10-CM | POA: Diagnosis not present

## 2012-08-17 DIAGNOSIS — Z885 Allergy status to narcotic agent status: Secondary | ICD-10-CM | POA: Diagnosis not present

## 2012-08-17 DIAGNOSIS — E669 Obesity, unspecified: Secondary | ICD-10-CM | POA: Diagnosis not present

## 2012-08-17 DIAGNOSIS — Z79899 Other long term (current) drug therapy: Secondary | ICD-10-CM | POA: Diagnosis not present

## 2012-08-17 DIAGNOSIS — Z7982 Long term (current) use of aspirin: Secondary | ICD-10-CM | POA: Diagnosis not present

## 2012-08-17 DIAGNOSIS — G4730 Sleep apnea, unspecified: Secondary | ICD-10-CM | POA: Diagnosis not present

## 2012-08-17 DIAGNOSIS — E785 Hyperlipidemia, unspecified: Secondary | ICD-10-CM | POA: Diagnosis not present

## 2012-08-17 DIAGNOSIS — Z6832 Body mass index (BMI) 32.0-32.9, adult: Secondary | ICD-10-CM | POA: Diagnosis not present

## 2012-08-17 DIAGNOSIS — Z888 Allergy status to other drugs, medicaments and biological substances status: Secondary | ICD-10-CM | POA: Diagnosis not present

## 2012-08-18 LAB — PATHOLOGY REPORT

## 2012-08-24 ENCOUNTER — Encounter: Payer: Self-pay | Admitting: Pulmonary Disease

## 2012-08-24 ENCOUNTER — Ambulatory Visit (INDEPENDENT_AMBULATORY_CARE_PROVIDER_SITE_OTHER): Payer: Medicare Other | Admitting: Pulmonary Disease

## 2012-08-24 VITALS — BP 120/76 | HR 92 | Temp 97.9°F | Ht 62.5 in | Wt 179.0 lb

## 2012-08-24 NOTE — Patient Instructions (Addendum)
Will set your new machine on auto at least for the next 2 weeks, and I would like you to decide if the auto mode or fixed mode is more comfortable for you. Please call to let us know.  Work on weight loss followup with me in one year if doing well.

## 2012-08-24 NOTE — Assessment & Plan Note (Signed)
The patient has been doing fairly well with CPAP, and she is due for a new machine.  Given her history that suggest air trapping in the mornings, I would like to put her on the automatic setting for the first few weeks to see if she feels better when she awakens in the morning from a breathing standpoint.  She will let us know once this is done.  I have encouraged her to work aggressively on weight loss, and also to keep up with her mask changes and supplies.

## 2012-08-24 NOTE — Progress Notes (Signed)
  Subjective:    Patient ID: Traci Hall, female    DOB: Jan 23, 1946, 67 y.o.   MRN: [id number]  HPI The patient comes in today for followup of her obstructive sleep apnea.  At the last visit, her pressure was optimized to 8 cm of water, but she never received a new machine.  Apparently, her medical equipment company is going to deliver a new device today.  Patient feels she has done well with CPAP, and is having no issues with mask fit.  Her only complaint is related to what sounds like air trapping when she first awakens in the mornings.  She does have a history of chronic obstructive pulmonary disease.  She feels that her alertness during the day is adequate at this time.   Review of Systems  Constitutional: Negative for fever and unexpected weight change.  HENT: Positive for sinus pressure. Negative for ear pain, nosebleeds, congestion, sore throat, rhinorrhea, sneezing, trouble swallowing, dental problem and postnasal drip.   Eyes: Negative for redness and itching.  Respiratory: Negative for cough, chest tightness, shortness of breath and wheezing.   Cardiovascular: Negative for palpitations and leg swelling.  Gastrointestinal: Negative for nausea and vomiting.  Genitourinary: Negative for dysuria.  Musculoskeletal: Negative for joint swelling.  Skin: Negative for rash.  Neurological: Positive for headaches.  Hematological: Does not bruise/bleed easily.  Psychiatric/Behavioral: Negative for dysphoric mood. The patient is not nervous/anxious.        Objective:   Physical Exam Overweight female in no acute distress Nose without purulence or discharge noted No skin breakdown or pressure necrosis from the CPAP mask Neck without lymphadenopathy or thyromegaly Lower extremities with mild edema, no cyanosis Alert and oriented, moves all 4 extremities.       Assessment & Plan:

## 2012-09-06 ENCOUNTER — Ambulatory Visit (INDEPENDENT_AMBULATORY_CARE_PROVIDER_SITE_OTHER): Payer: Medicare Other | Admitting: Internal Medicine

## 2012-09-06 ENCOUNTER — Encounter: Payer: Self-pay | Admitting: Internal Medicine

## 2012-09-06 ENCOUNTER — Encounter: Payer: Self-pay | Admitting: Emergency Medicine

## 2012-09-06 VITALS — BP 134/84 | HR 72 | Temp 98.3°F | Resp 16 | Wt 175.0 lb

## 2012-09-06 DIAGNOSIS — R21 Rash and other nonspecific skin eruption: Secondary | ICD-10-CM

## 2012-09-06 DIAGNOSIS — M25519 Pain in unspecified shoulder: Secondary | ICD-10-CM

## 2012-09-06 DIAGNOSIS — M25511 Pain in right shoulder: Secondary | ICD-10-CM

## 2012-09-06 DIAGNOSIS — N61 Mastitis without abscess: Secondary | ICD-10-CM | POA: Diagnosis not present

## 2012-09-06 DIAGNOSIS — K2270 Barrett's esophagus without dysplasia: Secondary | ICD-10-CM

## 2012-09-06 MED ORDER — TRAMADOL HCL 50 MG PO TABS: 50.0000 mg | ORAL_TABLET | Freq: Three times a day (TID) | ORAL | Status: DC | PRN

## 2012-09-06 NOTE — Progress Notes (Addendum)
Patient ID: Traci Hall, female   DOB: 04/14/1946, 67 y.o.   MRN: [id number]   Patient Active Problem List  Diagnosis  . OSA (obstructive sleep apnea)  . COPD (chronic obstructive pulmonary disease)  . Rhinitis, allergic  . NASH (nonalcoholic steatohepatitis)  . Chest pain  . Breast inflammation  . Barrett's esophagus  . Pain in joint, shoulder region    Subjective:  CC:   Chief Complaint  Patient presents with  . Shoulder Pain    Right side x 2 mos    HPI:   Traci Chevere Mountainis a 67 y.o. female who presents Followup on multiple medical issues.    1) Acute on chronic shoulder pain. Patient reports pain in the right shoulder region which has been chronic for several months but has been much worse in the last 2 weeks. She has pain when she picks up a cup of coffee. Her range of motion is restricted members with regard to abduction and abduction and internal and external rotation. She has had n oinjury or  unusual activity including yard work but does spend quite a few hours at the computer using the mouse that particular motion using the mouse causes pain in the anterior portion of her shoulder. She had been using aspirin, ibuprofen and Tylenol for pain management but has been avoiding asa and motrin since January because the nurse practitioner at her gastroenterologist's office has told her to abstain from ibuprofen and aspirin prior to her procedure and she has not resumed these medications. In continues to have pain that is interfering with activities.  she is also describing neck and low back pain which are also chronic. There is no radiation of pain to legs. There is no numbness or tingling or loss of strength. 2) Barrett's esophagus. Patient underwent a diagnostic EGD in January of 2014 by Dr. Bluford Kaufmann and was found to have Barrett's esophagus. She was prescribed PPI and H2 blocker therapy with morning  nexium  and pepcid 40 mg .  3) breast tenderness. Patient was evaluated last  month for breast tenderness and underwent a diagnostic mammogram and ultrasound which were normal. Her tenderness has resolved. She presumes that the redness and tenderness was due to some insect bite.   Past Medical History  Diagnosis Date  . Asthma   . Allergic rhinitis   . OSA on CPAP   . COPD (chronic obstructive pulmonary disease)     by prior PFTs  . Hyperlipidemia     Past Surgical History  Procedure Laterality Date  . Nasal sinus surgery  2008  . Appendectomy  1977  . Abdominal hysterectomy  1991    secondary to endometriosis, and polyps     The following portions of the patient's history were reviewed and updated as appropriate: Allergies, current medications, and problem list.    Review of Systems:   Patient denies headache, fevers, malaise, unintentional weight loss, skin rash, eye pain, sinus congestion and sinus pain, sore throat, dysphagia,  hemoptysis , cough, dyspnea, wheezing, chest pain, palpitations, orthopnea, edema, abdominal pain, nausea, melena, diarrhea, constipation, flank pain, dysuria, hematuria, urinary  Frequency, nocturia, numbness, tingling, seizures,  Focal weakness, Loss of consciousness,  Tremor, insomnia, depression, anxiety, and suicidal ideation.     History   Social History  . Marital Status: Married    Spouse Name: N/A    Number of Children: 2  . Years of Education: N/A   Occupational History  . retired     Social History  Main Topics  . Smoking status: Former Smoker -- 1.50 packs/day for 45 years    Types: Cigarettes    Quit date: 06/15/2005  . Smokeless tobacco: Never Used  . Alcohol Use: Yes  . Drug Use: No  . Sexually Active: Not on file   Other Topics Concern  . Not on file   Social History Narrative  . No narrative on file    Objective:  BP 134/84  Pulse 72  Temp(Src) 98.3 F (36.8 C) (Oral)  Resp 16  Wt 175 lb (79.379 kg)  BMI 31.48 kg/m2  SpO2 97%  General appearance: alert, cooperative and appears  stated age Neck: no adenopathy, no carotid bruit, supple, symmetrical, trachea midline and thyroid not enlarged, symmetric, no tenderness/mass/nodules Back: symmetric, no curvature. ROM normal. No CVA tenderness. Lungs: clear to auscultation bilaterally Heart: regular rate and rhythm, S1, S2 normal, no murmur, click, rub or gallop Skin: Skin color, texture, turgor normal. No rashes or lesions MSK: right shoulder tender over AC joint.  Pain radiates to deltoid and biceps muscle with forward flexion and abduction.   Assessment and Plan:  Pain in joint, shoulder region She has restricted range of motion on most notably in the forward flexion and abduction areas. She has point tenderness over the acromioclavicular joint. I suspect she either has a bone spur or mild capsulitis. Have given her several exercises and and referring to [facility] orthopedics for evaluation.  Breast inflammation Resolved spontaneously. Diagnostic mammogram is normal.  Barrett's esophagus Diagnosed by EGD in January 2014. Continue Nexium and Pepcid. Followup with Dr. Bluford Kaufmann for periodic endoscopic surveillance.   Updated Medication List Outpatient Encounter Prescriptions as of 09/06/2012  Medication Sig Dispense Refill  . albuterol (PROVENTIL HFA;VENTOLIN HFA) 108 (90 BASE) MCG/ACT inhaler 2 puffs every am, and then as needed      . aspirin 81 MG tablet Take 81 mg by mouth daily.      . Bimatoprost (LUMIGAN OP) Apply to eye. 1 drop in each eye p.m.      Marland Kitchen desloratadine (CLARINEX) 5 MG tablet Take 1 tablet (5 mg total) by mouth daily.  90 tablet  3  . Ergocalciferol (VITAMIN D2 PO) Take 1,000 Units by mouth daily.       Marland Kitchen esomeprazole (NEXIUM) 40 MG capsule Take 40 mg by mouth daily before breakfast.      . famotidine (PEPCID) 20 MG tablet Take 20 mg by mouth at bedtime as needed.       . Fluticasone Propionate, Inhal, 100 MCG/BLIST AEPB Inhale 1 puff into the lungs every morning.      . hydrocortisone (ANUSOL-HC) 25 MG  suppository Place 25 mg rectally. Prn       . montelukast (SINGULAIR) 10 MG tablet Take 10 mg by mouth at bedtime.      . Multiple Vitamins-Minerals (CENTRUM SILVER PO) Take by mouth. Take 1 am       . olopatadine (PATANOL) 0.1 % ophthalmic solution Place 1 drop into both eyes every morning.       [name] Glycol-Propyl Glycol (SYSTANE OP) Apply to eye. 1-2 drops in each eye as needed.      . tiotropium (SPIRIVA) 18 MCG inhalation capsule Place 1 capsule (18 mcg total) into inhaler and inhale daily.  90 capsule  2  . vitamin C (ASCORBIC ACID) 500 MG tablet Take 500 mg by mouth daily.      . vitamin E (VITAMIN E) 400 UNIT capsule Take 400 Units by mouth  daily.      . zolpidem (AMBIEN) 10 MG tablet Take 1 tablet (10 mg total) by mouth at bedtime as needed.  30 tablet  3  . traMADol (ULTRAM) 50 MG tablet Take 1 tablet (50 mg total) by mouth every 8 (eight) hours as needed for pain.  60 tablet  2   No facility-administered encounter medications on file as of 09/06/2012.     Orders Placed This Encounter  Procedures  . Ambulatory referral to Orthopedic Surgery    No Follow-up on file.

## 2012-09-06 NOTE — Patient Instructions (Addendum)
You may have an adhesive capsulitis vs bursitis of the right shoulder  I am referring you to Dr Shela Nevin for a steroid injection .   continue tylenol 500 mg every 8 hours,  Add tramadol if needed for worsening pain   Ice the front of the shoulder 15 minutes as needed

## 2012-09-07 ENCOUNTER — Encounter: Payer: Self-pay | Admitting: Internal Medicine

## 2012-09-07 ENCOUNTER — Telehealth: Payer: Self-pay | Admitting: Pulmonary Disease

## 2012-09-07 DIAGNOSIS — M25519 Pain in unspecified shoulder: Secondary | ICD-10-CM | POA: Insufficient documentation

## 2012-09-07 NOTE — Assessment & Plan Note (Signed)
Diagnosed by EGD in January 2014. Continue Nexium and Pepcid. Followup with Dr. Bluford Kaufmann for periodic endoscopic surveillance.

## 2012-09-07 NOTE — Assessment & Plan Note (Signed)
She has restricted range of motion on most notably in the forward flexion and abduction areas. She has point tenderness over the acromioclavicular joint. I suspect she either has a bone spur or mild capsulitis. Have given her several exercises and and referring to [facility] for evaluation.

## 2012-09-07 NOTE — Telephone Encounter (Signed)
Will set optimal pressure once I get download.

## 2012-09-07 NOTE — Telephone Encounter (Signed)
Spoke with patient-states [facility] came by today and got information for download to send to [facility]. Pt would like to know what changes if any will be made prior to order being placed to DME. Pt states that she and KC had discussed that he might put pressure at 10cwp and therefore asked the DME rep to set her pressure at 5-10 in the meantime; pt states she likes the pressure at 5-10 and is sleeping well with it. Will forward to [facility] and [place] to lookout for download and advise.

## 2012-09-07 NOTE — Assessment & Plan Note (Signed)
Resolved spontaneously. Diagnostic mammogram is normal.

## 2012-09-09 NOTE — Telephone Encounter (Signed)
[id number]. Message closed so I will hold in triage.  Carron Curie, CMA

## 2012-09-09 NOTE — Telephone Encounter (Signed)
Let her know that her optimal pressure is 11cm.  We can set her machine on this, or set auto to 5-12cm and leave there.  Whatever she wants to do.

## 2012-09-09 NOTE — Telephone Encounter (Signed)
D/L has been received and is in Sabree [place] folder for review.

## 2012-09-12 NOTE — Telephone Encounter (Signed)
I spoke with pt and she stated she really likes the auto setting and would like to stay there. She is currently set 5-10 cm and states she is doing great at this setting. Please advise KC thanks

## 2012-09-12 NOTE — Telephone Encounter (Signed)
Can leave her there, but she may have some nights where her sleep apnea is not totally controlled.  The other option is just to change auto 5-12, and would give her a little more coverage on those "bad nights".   Let me know.  I am ok with either way.

## 2012-09-12 NOTE — Telephone Encounter (Signed)
I spoke with pt. She stated she just wants to stay at her current setting for right now. If she has any problems further down the road she will let us know. Will forward to [facility] so he is aware.

## 2012-09-21 ENCOUNTER — Encounter: Payer: Self-pay | Admitting: Internal Medicine

## 2012-09-27 DIAGNOSIS — M7520 Bicipital tendinitis, unspecified shoulder: Secondary | ICD-10-CM | POA: Diagnosis not present

## 2012-09-28 ENCOUNTER — Encounter: Payer: Self-pay | Admitting: Internal Medicine

## 2012-09-28 DIAGNOSIS — M25511 Pain in right shoulder: Secondary | ICD-10-CM

## 2012-09-29 ENCOUNTER — Encounter: Payer: Self-pay | Admitting: Internal Medicine

## 2012-10-05 ENCOUNTER — Encounter: Payer: Self-pay | Admitting: Internal Medicine

## 2012-10-05 DIAGNOSIS — M6281 Muscle weakness (generalized): Secondary | ICD-10-CM | POA: Diagnosis not present

## 2012-10-05 DIAGNOSIS — M25519 Pain in unspecified shoulder: Secondary | ICD-10-CM | POA: Diagnosis not present

## 2012-10-05 DIAGNOSIS — IMO0001 Reserved for inherently not codable concepts without codable children: Secondary | ICD-10-CM | POA: Diagnosis not present

## 2012-10-08 ENCOUNTER — Encounter: Payer: Self-pay | Admitting: Pulmonary Disease

## 2012-10-10 DIAGNOSIS — M25519 Pain in unspecified shoulder: Secondary | ICD-10-CM | POA: Diagnosis not present

## 2012-10-10 DIAGNOSIS — IMO0001 Reserved for inherently not codable concepts without codable children: Secondary | ICD-10-CM | POA: Diagnosis not present

## 2012-10-10 DIAGNOSIS — M6281 Muscle weakness (generalized): Secondary | ICD-10-CM | POA: Diagnosis not present

## 2012-10-10 MED ORDER — ALBUTEROL SULFATE HFA 108 (90 BASE) MCG/ACT IN AERS: 2.0000 | INHALATION_SPRAY | Freq: Four times a day (QID) | RESPIRATORY_TRACT | Status: DC | PRN

## 2012-10-12 ENCOUNTER — Encounter: Payer: Self-pay | Admitting: Pulmonary Disease

## 2012-10-12 DIAGNOSIS — IMO0001 Reserved for inherently not codable concepts without codable children: Secondary | ICD-10-CM | POA: Diagnosis not present

## 2012-10-12 DIAGNOSIS — M6281 Muscle weakness (generalized): Secondary | ICD-10-CM | POA: Diagnosis not present

## 2012-10-12 DIAGNOSIS — M25519 Pain in unspecified shoulder: Secondary | ICD-10-CM | POA: Diagnosis not present

## 2012-10-13 ENCOUNTER — Encounter: Payer: Self-pay | Admitting: Internal Medicine

## 2012-10-13 DIAGNOSIS — M6281 Muscle weakness (generalized): Secondary | ICD-10-CM | POA: Diagnosis not present

## 2012-10-13 DIAGNOSIS — M25519 Pain in unspecified shoulder: Secondary | ICD-10-CM | POA: Diagnosis not present

## 2012-10-13 DIAGNOSIS — IMO0001 Reserved for inherently not codable concepts without codable children: Secondary | ICD-10-CM | POA: Diagnosis not present

## 2012-11-02 ENCOUNTER — Encounter: Payer: Self-pay | Admitting: Internal Medicine

## 2012-11-02 ENCOUNTER — Ambulatory Visit (INDEPENDENT_AMBULATORY_CARE_PROVIDER_SITE_OTHER): Payer: Medicare Other | Admitting: Internal Medicine

## 2012-11-02 VITALS — BP 132/82 | HR 111 | Temp 98.9°F | Resp 16 | Wt 171.5 lb

## 2012-11-02 DIAGNOSIS — K5732 Diverticulitis of large intestine without perforation or abscess without bleeding: Secondary | ICD-10-CM

## 2012-11-02 DIAGNOSIS — R1032 Left lower quadrant pain: Secondary | ICD-10-CM

## 2012-11-02 DIAGNOSIS — R197 Diarrhea, unspecified: Secondary | ICD-10-CM | POA: Diagnosis not present

## 2012-11-02 LAB — POCT URINALYSIS DIPSTICK
Ketones, UA: 15
Nitrite, UA: NEGATIVE
Protein, UA: >=300
Urobilinogen, UA: 0.2

## 2012-11-02 LAB — CBC WITH DIFFERENTIAL/PLATELET
Eosinophils Relative: 4.1 % (ref 0.0–5.0)
HCT: 43.8 % (ref 36.0–46.0)
Lymphocytes Relative: 33.7 % (ref 12.0–46.0)
Monocytes Relative: 6.3 % (ref 3.0–12.0)
Neutrophils Relative %: 55.5 % (ref 43.0–77.0)
Platelets: 306 10*3/uL (ref 150.0–400.0)
WBC: 14.3 10*3/uL — ABNORMAL HIGH (ref 4.5–10.5)

## 2012-11-02 LAB — COMPREHENSIVE METABOLIC PANEL
ALT: 68 U/L — ABNORMAL HIGH (ref 0–35)
CO2: 28 mEq/L (ref 19–32)
Creatinine, Ser: 1.1 mg/dL (ref 0.4–1.2)
GFR: 55.57 mL/min — ABNORMAL LOW (ref >60.00)
Total Bilirubin: 0.7 mg/dL (ref 0.3–1.2)

## 2012-11-02 MED ORDER — METRONIDAZOLE 500 MG PO TABS: 500.0000 mg | ORAL_TABLET | Freq: Three times a day (TID) | ORAL | Status: DC

## 2012-11-02 MED ORDER — CIPROFLOXACIN HCL 250 MG PO TABS: 250.0000 mg | ORAL_TABLET | Freq: Two times a day (BID) | ORAL | Status: DC

## 2012-11-02 MED ORDER — ONDANSETRON HCL 8 MG PO TABS: 8.0000 mg | ORAL_TABLET | Freq: Three times a day (TID) | ORAL | Status: DC | PRN

## 2012-11-02 NOTE — Patient Instructions (Addendum)
I am treating you for diverticulitis with cipro and flagyl for 7 days   Continue the clear liquid diet until your diarrhea resolves  Then resume feeding with mashed potatoes, applesauce,  (no dairy or salad) until nausea is back to normal

## 2012-11-02 NOTE — Progress Notes (Signed)
Patient ID: Traci Hall, female   DOB: 12/24/1945, 67 y.o.   MRN: [id number]   Patient Active Problem List   Diagnosis Date Noted  . Diverticulitis of colon (without mention of hemorrhage) 11/04/2012  . Pain in joint, shoulder region 09/07/2012  . Barrett's esophagus 09/06/2012  . Breast inflammation 05/25/2012  . Chest pain 05/24/2012  . NASH (nonalcoholic steatohepatitis) 10/02/2011  . COPD (chronic obstructive pulmonary disease) 09/01/2011  . Rhinitis, allergic 09/01/2011  . OSA (obstructive sleep apnea) 08/25/2011    Subjective:  CC:   Chief Complaint  Patient presents with  . Acute Visit    X 5 days nausea and stomache pain, diarrhea , emesis X 2    HPI:   Traci Scipio Mountainis a 67 y.o. female who presents with nausea and diarrhea x 5 days.  Friday night had dinner at [employer],  At 4 am Saturday morning was woken by nausea without emesis, .  Ate breakfast in [place] and developed abdominal pain.  Took Nexium twice and abstained from eating but drank fluids. Had a normal bowel movement on Saturday, and woke up Sunday feeling somewhat better in the morning but afternoon felt feverish, no body aches,  butr abdomen hurt to touch and was distended.  .  Had nonbloody emesis on Monday morning an hour or so after eating corn flakes with mild. Emesis was mostly mucuous, no food particles.  Had loose stools Monday afternoon,  Tuesday felt a little better,  But had vomiting and diarrhea once each.  2 am  This morning had a BM followed by loose stool .  Some gas.  The nausea has been constant, relieved temp by nausea.no abd cramping .  No recent sick contacts or travel    Past Medical History  Diagnosis Date  . Asthma   . Allergic rhinitis   . OSA on CPAP   . COPD (chronic obstructive pulmonary disease)     by prior PFTs  . Hyperlipidemia     Past Surgical History  Procedure Laterality Date  . Nasal sinus surgery  2008  . Appendectomy  1977  . Abdominal hysterectomy   1991    secondary to endometriosis, and polyps       The following portions of the patient's history were reviewed and updated as appropriate: Allergies, current medications, and problem list.    Review of Systems:   12 Pt  review of systems was negative except those addressed in the HPI,     History   Social History  . Marital Status: Married    Spouse Name: N/A    Number of Children: 2  . Years of Education: N/A   Occupational History  . retired     Social History Main Topics  . Smoking status: Former Smoker -- 1.50 packs/day for 45 years    Types: Cigarettes    Quit date: 06/15/2005  . Smokeless tobacco: Never Used  . Alcohol Use: Yes  . Drug Use: No  . Sexually Active: Not on file   Other Topics Concern  . Not on file   Social History Narrative  . No narrative on file    Objective:  BP 132/82  Pulse 111  Temp(Src) 98.9 F (37.2 C) (Oral)  Resp 16  Wt 171 lb 8 oz (77.792 kg)  BMI 30.85 kg/m2  SpO2 97%  General appearance: alert, cooperative and appears stated age Ears: normal TM's and external ear canals both ears Throat: lips, mucosa, and tongue normal; teeth and  gums normal Neck: no adenopathy, no carotid bruit, supple, symmetrical, trachea midline and thyroid not enlarged, symmetric, no tenderness/mass/nodules Back: symmetric, no curvature. ROM normal. No CVA tenderness. Lungs: clear to auscultation bilaterally Heart: regular rate and rhythm, S1, S2 normal, no murmur, click, rub or gallop Abdomen: soft, tender in mid epigastric region and LLQ without rebound or guarding,  No masses,  Bowel sounds hypoactive  Pulses: 2+ and symmetric Skin: Skin color, texture, turgor normal. No rashes or lesions Lymph nodes: Cervical, supraclavicular, and axillary nodes normal.  Assessment and Plan:  Diverticulitis of colon (without mention of hemorrhage) Suspected by exam and history with diverticula noted on Sept 2013 colonoscopy. Patient tolerating PO and  not constipated.  Recommend  Clear liquid diet. cipro/flagyl x 7 days    Updated Medication List Outpatient Encounter Prescriptions as of 11/02/2012  Medication Sig Dispense Refill  . albuterol (PROVENTIL HFA;VENTOLIN HFA) 108 (90 BASE) MCG/ACT inhaler 2 puffs every am, and then as needed      . albuterol (VENTOLIN HFA) 108 (90 BASE) MCG/ACT inhaler Inhale 2 puffs into the lungs every 6 (six) hours as needed.  3 Inhaler  3  . aspirin 81 MG tablet Take 81 mg by mouth daily.      . Bimatoprost (LUMIGAN OP) Apply to eye. 1 drop in each eye p.m.      Marland Kitchen desloratadine (CLARINEX) 5 MG tablet Take 1 tablet (5 mg total) by mouth daily.  90 tablet  3  . Ergocalciferol (VITAMIN D2 PO) Take 1,000 Units by mouth daily.       Marland Kitchen esomeprazole (NEXIUM) 40 MG capsule Take 40 mg by mouth daily before breakfast.      . famotidine (PEPCID) 20 MG tablet Take 20 mg by mouth at bedtime as needed.       . Fluticasone Propionate, Inhal, 100 MCG/BLIST AEPB Inhale 1 puff into the lungs every morning.      . hydrocortisone (ANUSOL-HC) 25 MG suppository Place 25 mg rectally. Prn       . montelukast (SINGULAIR) 10 MG tablet Take 10 mg by mouth at bedtime.      . Multiple Vitamins-Minerals (CENTRUM SILVER PO) Take by mouth. Take 1 am       . olopatadine (PATANOL) 0.1 % ophthalmic solution Place 1 drop into both eyes every morning.       [name] Glycol-Propyl Glycol (SYSTANE OP) Apply to eye. 1-2 drops in each eye as needed.      . tiotropium (SPIRIVA) 18 MCG inhalation capsule Place 1 capsule (18 mcg total) into inhaler and inhale daily.  90 capsule  2  . vitamin C (ASCORBIC ACID) 500 MG tablet Take 500 mg by mouth daily.      . vitamin E (VITAMIN E) 400 UNIT capsule Take 400 Units by mouth daily.      Marland Kitchen zolpidem (AMBIEN) 10 MG tablet Take 1 tablet (10 mg total) by mouth at bedtime as needed.  30 tablet  3  . ciprofloxacin (CIPRO) 250 MG tablet Take 1 tablet (250 mg total) by mouth 2 (two) times daily.  14 tablet  0  .  metroNIDAZOLE (FLAGYL) 500 MG tablet Take 1 tablet (500 mg total) by mouth 3 (three) times daily.  21 tablet  0  . ondansetron (ZOFRAN) 8 MG tablet Take 1 tablet (8 mg total) by mouth every 8 (eight) hours as needed for nausea.  20 tablet  0  . traMADol (ULTRAM) 50 MG tablet Take 1 tablet (50 mg  total) by mouth every 8 (eight) hours as needed for pain.  60 tablet  2   No facility-administered encounter medications on file as of 11/02/2012.     Orders Placed This Encounter  Procedures  . Urine culture  . CBC with Differential  . Comprehensive metabolic panel  . C-reactive protein  . Sedimentation rate  . POCT Urinalysis Dipstick  . HM COLONOSCOPY    No Follow-up on file.

## 2012-11-03 ENCOUNTER — Encounter: Payer: Self-pay | Admitting: Internal Medicine

## 2012-11-04 ENCOUNTER — Encounter: Payer: Self-pay | Admitting: Internal Medicine

## 2012-11-04 DIAGNOSIS — K5731 Diverticulosis of large intestine without perforation or abscess with bleeding: Secondary | ICD-10-CM | POA: Insufficient documentation

## 2012-11-04 NOTE — Assessment & Plan Note (Signed)
Suspected by exam and history with diverticula noted on Sept 2013 colonoscopy. Patient tolerating PO and not constipated.  Recommend  Clear liquid diet. cipro/flagyl x 7 days

## 2012-11-05 LAB — URINE CULTURE: Colony Count: 90000

## 2012-11-13 ENCOUNTER — Encounter: Payer: Self-pay | Admitting: Internal Medicine

## 2012-11-15 ENCOUNTER — Encounter: Payer: Self-pay | Admitting: Pulmonary Disease

## 2012-11-15 ENCOUNTER — Ambulatory Visit (INDEPENDENT_AMBULATORY_CARE_PROVIDER_SITE_OTHER): Payer: Medicare Other | Admitting: Pulmonary Disease

## 2012-11-15 VITALS — BP 120/70 | HR 80 | Temp 98.5°F | Ht 62.0 in | Wt 174.0 lb

## 2012-11-15 DIAGNOSIS — G4733 Obstructive sleep apnea (adult) (pediatric): Secondary | ICD-10-CM

## 2012-11-15 DIAGNOSIS — J449 Chronic obstructive pulmonary disease, unspecified: Secondary | ICD-10-CM

## 2012-11-15 NOTE — Patient Instructions (Signed)
Keep using your spiriva and flovent as you are doing  We will see you back in 6 months or sooner if needed

## 2012-11-15 NOTE — Progress Notes (Signed)
Subjective:    Patient ID: Traci Hall, female    DOB: 1945/09/07, 67 y.o.   MRN: [id number]  Synopsis: Traci Hall first saw [facility] in 08/2011 for shortness of breath.  She was diagnosed with Grade A COPD.  She previously smoked 1.5 ppd for 45 years.  She has allergic rhinitis as well.  HPI   11/16/11 ROV -- Traci Hall has been doing very well.  She only had to use her albuterol a few times in the last few months due to chest congestion, but really has not had any trouble breathing.  She feels that she could stop the singulair.  She is taking the flovent once per day.  She continues to use the spiriva once per day.  05/24/2012 -- Traci Hall started using the Flovent at night a few months ago when she was having more chest congestion.  It doesn't sound like the flovent helped consistently, but when she started using the singulair again things really cleared up.  She was having a lot of sinus congestion as well.  This was back in the fall. She continues to use the Spiriva daily and she has not had bronchitis since using it.   She has been having a burning sensation in the R chest for the last two weeks.  It is distinct from her GERD symptoms. It is constant and not exacerbated by deep breathing or exercise.  She does not have new cough or dyspnea.  She has some swelling and redness in her breast since a shopping trip a week ago.  She is to see Dr. Darrick Huntsman tomorrow for this.   11/15/2012 ROV >> Traci Hall has been doing well since the last visit. She has been walking more lately. She states that she takes albuterol 2 puffs about 10 minutes before going for a walk. She can walk up to 1/2 mile at a time without stopping. She does have some difficulty with dyspnea while walking. In general though she's had very little respiratory complaints. She does not have cough. She recovered from an episode of diverticulitis several weeks ago and is now doing well.   Past Medical History  Diagnosis  Date  . Asthma   . Allergic rhinitis   . OSA on CPAP   . COPD (chronic obstructive pulmonary disease)     by prior PFTs  . Hyperlipidemia       Review of Systems  Constitutional: Negative for fever, chills and unexpected weight change.  HENT: Negative for ear pain, nosebleeds, rhinorrhea and postnasal drip.   Respiratory: Negative for cough, choking, chest tightness and shortness of breath.   Cardiovascular: Negative for chest pain and leg swelling.       Objective:   Physical Exam   Filed Vitals:   11/15/12 1141  BP: 120/70  Pulse: 80  Temp: 98.5 F (36.9 C)  TempSrc: Oral  Height: 5\' 2"  (1.575 m)  Weight: 174 lb (78.926 kg)  SpO2: 99%   Gen: well appearing, no acute distress HEENT: NCAT, PERRL, EOMi, OP clear, neck supple without masses PULM: CTA B, no wheezing CV: RRR, no mgr, no JVD AB: BS+, soft, nontender, no hsm Ext: warm, no edema, no clubbing, no cyanosis  08/2011 Simple spirometry: F/F ratio 67% FEV1 1.74L (79% pred)     Assessment & Plan:   COPD (chronic obstructive pulmonary disease) This is been a stable interval for Traci Hall. There is no indication to change her therapies.  Plan: -Continue Spiriva and Flovent -Continue albuterol  before exercise -Encourage her to exercise as much as possible.  OSA (obstructive sleep apnea) She continues to do well with CPAP. I have nothing to add to the excellent care provided by Dr. Shelle Iron.    Updated Medication List Outpatient Encounter Prescriptions as of 11/15/2012  Medication Sig Dispense Refill  . albuterol (PROVENTIL HFA;VENTOLIN HFA) 108 (90 BASE) MCG/ACT inhaler 2 puffs every am, and then as needed      . albuterol (VENTOLIN HFA) 108 (90 BASE) MCG/ACT inhaler Inhale 2 puffs into the lungs every 6 (six) hours as needed.  3 Inhaler  3  . aspirin 81 MG tablet Take 81 mg by mouth daily.      . Bimatoprost (LUMIGAN OP) Apply to eye. 1 drop in each eye p.m.      Marland Kitchen Calcium Carbonate-Vitamin D (CALCIUM-VITAMIN  D) 500-200 MG-UNIT per tablet Take 1 tablet by mouth daily.      Marland Kitchen desloratadine (CLARINEX) 5 MG tablet Take 1 tablet (5 mg total) by mouth daily.  90 tablet  3  . Ergocalciferol (VITAMIN D2 PO) Take 1,000 Units by mouth daily.       Marland Kitchen esomeprazole (NEXIUM) 40 MG capsule Take 40 mg by mouth daily before breakfast.      . famotidine (PEPCID) 20 MG tablet Take 20 mg by mouth at bedtime as needed.       . Fluticasone Propionate, Inhal, 100 MCG/BLIST AEPB Inhale 1 puff into the lungs every morning.      . hydrocortisone (ANUSOL-HC) 25 MG suppository Place 25 mg rectally. Prn       . montelukast (SINGULAIR) 10 MG tablet Take 10 mg by mouth at bedtime.      . Multiple Vitamins-Minerals (CENTRUM SILVER PO) Take by mouth. Take 1 am       . olopatadine (PATANOL) 0.1 % ophthalmic solution Place 1 drop into both eyes every morning.       . ondansetron (ZOFRAN) 8 MG tablet Take 1 tablet (8 mg total) by mouth every 8 (eight) hours as needed for nausea.  20 tablet  0  . Polyethyl Glycol-Propyl Glycol (SYSTANE OP) Apply to eye. 1-2 drops in each eye as needed.      . tiotropium (SPIRIVA) 18 MCG inhalation capsule Place 1 capsule (18 mcg total) into inhaler and inhale daily.  90 capsule  2  . traMADol (ULTRAM) 50 MG tablet Take 1 tablet (50 mg total) by mouth every 8 (eight) hours as needed for pain.  60 tablet  2  . vitamin C (ASCORBIC ACID) 500 MG tablet Take 500 mg by mouth daily.      . vitamin E (VITAMIN E) 400 UNIT capsule Take 400 Units by mouth daily.      Marland Kitchen zolpidem (AMBIEN) 10 MG tablet Take 1 tablet (10 mg total) by mouth at bedtime as needed.  30 tablet  3  . [DISCONTINUED] ciprofloxacin (CIPRO) 250 MG tablet Take 1 tablet (250 mg total) by mouth 2 (two) times daily.  14 tablet  0  . [DISCONTINUED] metroNIDAZOLE (FLAGYL) 500 MG tablet Take 1 tablet (500 mg total) by mouth 3 (three) times daily.  21 tablet  0   No facility-administered encounter medications on file as of 11/15/2012.

## 2012-11-15 NOTE — Assessment & Plan Note (Signed)
She continues to do well with CPAP. I have nothing to add to the excellent care provided by Dr. Shelle Iron.

## 2012-11-15 NOTE — Assessment & Plan Note (Signed)
This is been a stable interval for Traci Hall. There is no indication to change her therapies.  Plan: -Continue Spiriva and Flovent -Continue albuterol before exercise -Encourage her to exercise as much as possible.

## 2012-11-21 ENCOUNTER — Encounter: Payer: Self-pay | Admitting: Internal Medicine

## 2012-11-21 ENCOUNTER — Ambulatory Visit (INDEPENDENT_AMBULATORY_CARE_PROVIDER_SITE_OTHER): Payer: Medicare Other | Admitting: Internal Medicine

## 2012-11-21 ENCOUNTER — Other Ambulatory Visit (HOSPITAL_COMMUNITY)
Admission: RE | Admit: 2012-11-21 | Discharge: 2012-11-21 | Disposition: A | Discharge status: Home / Self Care | Payer: Medicare Other | Source: Ambulatory Visit | Attending: Internal Medicine | Admitting: Internal Medicine

## 2012-11-21 VITALS — BP 138/84 | HR 85 | Temp 98.6°F | Resp 16 | Ht 62.0 in | Wt 173.2 lb

## 2012-11-21 DIAGNOSIS — R5381 Other malaise: Secondary | ICD-10-CM | POA: Diagnosis not present

## 2012-11-21 DIAGNOSIS — K7689 Other specified diseases of liver: Secondary | ICD-10-CM

## 2012-11-21 DIAGNOSIS — E118 Type 2 diabetes mellitus with unspecified complications: Secondary | ICD-10-CM | POA: Insufficient documentation

## 2012-11-21 DIAGNOSIS — H9110 Presbycusis, unspecified ear: Secondary | ICD-10-CM | POA: Diagnosis not present

## 2012-11-21 DIAGNOSIS — Z01419 Encounter for gynecological examination (general) (routine) without abnormal findings: Secondary | ICD-10-CM | POA: Diagnosis not present

## 2012-11-21 DIAGNOSIS — R7309 Other abnormal glucose: Secondary | ICD-10-CM | POA: Diagnosis not present

## 2012-11-21 DIAGNOSIS — E785 Hyperlipidemia, unspecified: Secondary | ICD-10-CM

## 2012-11-21 DIAGNOSIS — R739 Hyperglycemia, unspecified: Secondary | ICD-10-CM

## 2012-11-21 DIAGNOSIS — R5383 Other fatigue: Secondary | ICD-10-CM | POA: Diagnosis not present

## 2012-11-21 DIAGNOSIS — K7581 Nonalcoholic steatohepatitis (NASH): Secondary | ICD-10-CM

## 2012-11-21 DIAGNOSIS — Z1239 Encounter for other screening for malignant neoplasm of breast: Secondary | ICD-10-CM

## 2012-11-21 DIAGNOSIS — H9190 Unspecified hearing loss, unspecified ear: Secondary | ICD-10-CM | POA: Diagnosis not present

## 2012-11-21 DIAGNOSIS — M2140 Flat foot [pes planus] (acquired), unspecified foot: Secondary | ICD-10-CM

## 2012-11-21 DIAGNOSIS — M2141 Flat foot [pes planus] (acquired), right foot: Secondary | ICD-10-CM | POA: Insufficient documentation

## 2012-11-21 DIAGNOSIS — Z124 Encounter for screening for malignant neoplasm of cervix: Secondary | ICD-10-CM | POA: Diagnosis not present

## 2012-11-21 DIAGNOSIS — E559 Vitamin D deficiency, unspecified: Secondary | ICD-10-CM

## 2012-11-21 DIAGNOSIS — Z0000 Encounter for general adult medical examination without abnormal findings: Secondary | ICD-10-CM | POA: Diagnosis not present

## 2012-11-21 DIAGNOSIS — Z1151 Encounter for screening for human papillomavirus (HPV): Secondary | ICD-10-CM | POA: Diagnosis not present

## 2012-11-21 LAB — COMPREHENSIVE METABOLIC PANEL
ALT: 68 U/L — ABNORMAL HIGH (ref 0–35)
AST: 45 U/L — ABNORMAL HIGH (ref 0–37)
Albumin: 3.8 g/dL (ref 3.5–5.2)
Alkaline Phosphatase: 64 U/L (ref 39–117)
Calcium: 9.6 mg/dL (ref 8.4–10.5)
Chloride: 106 mEq/L (ref 96–112)
Creatinine, Ser: 0.8 mg/dL (ref 0.4–1.2)
Potassium: 4.2 mEq/L (ref 3.5–5.1)

## 2012-11-21 LAB — CBC WITH DIFFERENTIAL/PLATELET
Eosinophils Relative: 4.4 % (ref 0.0–5.0)
HCT: 39.1 % (ref 36.0–46.0)
Hemoglobin: 13.1 g/dL (ref 12.0–15.0)
Lymphs Abs: 2.9 10*3/uL (ref 0.7–4.0)
MCV: 91.2 fl (ref 78.0–100.0)
Monocytes Absolute: 0.4 10*3/uL (ref 0.1–1.0)
Neutro Abs: 4 10*3/uL (ref 1.4–7.7)
Platelets: 250 10*3/uL (ref 150.0–400.0)
WBC: 7.7 10*3/uL (ref 4.5–10.5)

## 2012-11-21 LAB — MICROALBUMIN / CREATININE URINE RATIO
Creatinine,U: 69.1 mg/dL
Microalb Creat Ratio: 0.6 mg/g (ref 0.0–30.0)
Microalb, Ur: 0.4 mg/dL (ref 0.0–1.9)

## 2012-11-21 LAB — LDL CHOLESTEROL, DIRECT: Direct LDL: 160.2 mg/dL

## 2012-11-21 LAB — HEMOGLOBIN A1C: Hgb A1c MFr Bld: 6 % (ref 4.6–6.5)

## 2012-11-21 NOTE — Assessment & Plan Note (Addendum)
Recommending weight loss and low GI diet.  LFTs are mildly elevated but stable.

## 2012-11-21 NOTE — Assessment & Plan Note (Signed)
A1c is 6.0,  Urine microalbumin to cr ratio is normal

## 2012-11-21 NOTE — Progress Notes (Signed)
Patient ID: Traci Hall, female   DOB: 1945-11-29, 67 y.o.   MRN: [id number] The patient is here for annual Medicare wellness examination and management of other chronic and acute problems.   The risk factors are reflected in the social history.  The roster of all physicians providing medical care to patient - is listed in the Snapshot section of the chart.  Activities of daily living:  The patient is 100% independent in all ADLs: dressing, toileting, feeding as well as independent mobility  Home safety : The patient has smoke detectors in the home. They wear seatbelts.  There are no firearms at home. There is no violence in the home.   There is no risks for hepatitis, STDs or HIV. There is no   history of blood transfusion. They have no travel history to infectious disease endemic areas of the world.  The patient has seen their dentist in the last six month. They have seen their eye doctor in the last year. They admit to slight hearing difficulty with regard to whispered voices and some television programs.  They have deferred audiologic testing in the last year.  They do not  have excessive sun exposure. Discussed the need for sun protection: hats, long sleeves and use of sunscreen if there is significant sun exposure.   Diet: the importance of a healthy diet is discussed. They do have a healthy diet.  The benefits of regular aerobic exercise were discussed. She walks 4 times per week ,  20 minutes.   Depression screen: there are no signs or vegative symptoms of depression- irritability, change in appetite, anhedonia, sadness/tearfullness.  Cognitive assessment: the patient manages all their financial and personal affairs and is actively engaged. They could relate day,date,year and events; recalled 2/3 objects at 3 minutes; performed clock-face test normally.  The following portions of the patient's history were reviewed and updated as appropriate: allergies, current medications, past  family history, past medical history,  past surgical history, past social history  and problem list.  Visual acuity was not assessed per patient preference since she has regular follow up with her ophthalmologist. Hearing and body mass index were assessed and reviewed.   During the course of the visit the patient was educated and counseled about appropriate screening and preventive services including : fall prevention , diabetes screening, nutrition counseling, colorectal cancer screening, and recommended immunizations.    Objective:  BP 138/84  Pulse 85  Temp(Src) 98.6 F (37 C) (Oral)  Resp 16  Ht 5\' 2"  (1.575 m)  Wt 173 lb 4 oz (78.586 kg)  BMI 31.68 kg/m2  SpO2 98%  General Appearance:    Alert, cooperative, no distress, appears stated age  Head:    Normocephalic, without obvious abnormality, atraumatic  Eyes:    PERRL, conjunctiva/corneas clear, EOM's intact, fundi    benign, both eyes  Ears:    Normal TM's and external ear canals, both ears  Nose:   Nares normal, septum midline, mucosa normal, no drainage    or sinus tenderness  Throat:   Lips, mucosa, and tongue normal; teeth and gums normal  Neck:   Supple, symmetrical, trachea midline, no adenopathy;    thyroid:  no enlargement/tenderness/nodules; no carotid   bruit or JVD  Back:     Symmetric, no curvature, ROM normal, no CVA tenderness  Lungs:     Clear to auscultation bilaterally, respirations unlabored  Chest Wall:    No tenderness or deformity   Heart:    Regular  rate and rhythm, S1 and S2 normal, no murmur, rub   or gallop  Breast Exam:    No tenderness, masses, or nipple abnormality  Abdomen:     Soft, non-tender, bowel sounds active all four quadrants,    no masses, no organomegaly  Genitalia:    Pelvic: cervix normal in appearance, external genitalia normal, no adnexal masses or tenderness, no cervical motion tenderness, rectovaginal septum normal, uterus normal size, shape, and consistency and vagina normal  without discharge  Extremities:   Extremities normal, atraumatic, no cyanosis or edema  Pulses:   2+ and symmetric all extremities  Skin:   Skin color, texture, turgor normal, no rashes or lesions  Lymph nodes:   Cervical, supraclavicular, and axillary nodes normal  Neurologic:   CNII-XII intact, normal strength, sensation and reflexes    throughout   Assessment and Plan:  Routine general medical examination at a health care facility Annual exam including breast , pelvic and PAP smear were done today. All screenigns were addressed  NASH (nonalcoholic steatohepatitis) Recommending weight loss and low GI diet.  LFTs are mildly elevated but stable.   Acquired pes planus of both feet Referral to [facility] for evaluation and treatment   Hearing loss Referral to audiology for hearing rest.   Hyperglycemia A1c is 6.0,  Urine microalbumin to cr ratio is normal    Updated Medication List Outpatient Encounter Prescriptions as of 11/21/2012  Medication Sig Dispense Refill  . albuterol (PROVENTIL HFA;VENTOLIN HFA) 108 (90 BASE) MCG/ACT inhaler 2 puffs every am, and then as needed      . albuterol (VENTOLIN HFA) 108 (90 BASE) MCG/ACT inhaler Inhale 2 puffs into the lungs every 6 (six) hours as needed.  3 Inhaler  3  . aspirin 81 MG tablet Take 81 mg by mouth daily.      . Bimatoprost (LUMIGAN OP) Apply to eye. 1 drop in each eye p.m.      Marland Kitchen desloratadine (CLARINEX) 5 MG tablet Take 1 tablet (5 mg total) by mouth daily.  90 tablet  3  . Ergocalciferol (VITAMIN D2 PO) Take 1,000 Units by mouth daily.       Marland Kitchen esomeprazole (NEXIUM) 40 MG capsule Take 40 mg by mouth daily before breakfast.      . famotidine (PEPCID) 20 MG tablet Take 20 mg by mouth at bedtime as needed.       . Fluticasone Propionate, Inhal, 100 MCG/BLIST AEPB Inhale 1 puff into the lungs every morning.      . hydrocortisone (ANUSOL-HC) 25 MG suppository Place 25 mg rectally. Prn       . montelukast (SINGULAIR) 10 MG tablet  Take 10 mg by mouth at bedtime.      . Multiple Vitamins-Minerals (CENTRUM SILVER PO) Take by mouth. Take 1 am       . olopatadine (PATANOL) 0.1 % ophthalmic solution Place 1 drop into both eyes every morning.       . tiotropium (SPIRIVA) 18 MCG inhalation capsule Place 1 capsule (18 mcg total) into inhaler and inhale daily.  90 capsule  2  . vitamin C (ASCORBIC ACID) 500 MG tablet Take 500 mg by mouth daily.      . vitamin E (VITAMIN E) 400 UNIT capsule Take 400 Units by mouth daily.      Marland Kitchen zolpidem (AMBIEN) 10 MG tablet Take 1 tablet (10 mg total) by mouth at bedtime as needed.  30 tablet  3  . [DISCONTINUED] Calcium Carbonate-Vitamin D (CALCIUM-VITAMIN D)  500-200 MG-UNIT per tablet Take 1 tablet by mouth daily.      . ondansetron (ZOFRAN) 8 MG tablet Take 1 tablet (8 mg total) by mouth every 8 (eight) hours as needed for nausea.  20 tablet  0  . Polyethyl Glycol-Propyl Glycol (SYSTANE OP) Apply to eye. 1-2 drops in each eye as needed.      . traMADol (ULTRAM) 50 MG tablet Take 1 tablet (50 mg total) by mouth every 8 (eight) hours as needed for pain.  60 tablet  2   No facility-administered encounter medications on file as of 11/21/2012.

## 2012-11-21 NOTE — Assessment & Plan Note (Signed)
Annual exam including breast , pelvic and PAP smear were done today. All screenigns were addressed

## 2012-11-21 NOTE — Assessment & Plan Note (Signed)
Referral to audiology for hearing rest.

## 2012-11-21 NOTE — Assessment & Plan Note (Signed)
Referral to [facility] for evaluation and treatment

## 2012-11-22 ENCOUNTER — Encounter: Payer: Self-pay | Admitting: Internal Medicine

## 2012-11-24 ENCOUNTER — Encounter: Payer: Self-pay | Admitting: Internal Medicine

## 2012-11-28 ENCOUNTER — Telehealth: Payer: Self-pay

## 2012-11-28 NOTE — Telephone Encounter (Signed)
My Chart message: Your PAP smear was normal, HPV screen negative. Repeat in 2 years . I have samples of crestor waiting for you at the front desk for your return, I would advise trying it every other day for a few weeks, If tolerated, i will sedn rx to pharmacy.    Patient was notified about her results. And patient she will come by the office to pick up her crestor

## 2012-12-01 DIAGNOSIS — Z1231 Encounter for screening mammogram for malignant neoplasm of breast: Secondary | ICD-10-CM | POA: Diagnosis not present

## 2012-12-01 DIAGNOSIS — H903 Sensorineural hearing loss, bilateral: Secondary | ICD-10-CM | POA: Diagnosis not present

## 2012-12-01 DIAGNOSIS — H905 Unspecified sensorineural hearing loss: Secondary | ICD-10-CM | POA: Diagnosis not present

## 2012-12-06 DIAGNOSIS — M2140 Flat foot [pes planus] (acquired), unspecified foot: Secondary | ICD-10-CM | POA: Diagnosis not present

## 2012-12-06 DIAGNOSIS — M24876 Other specific joint derangements of unspecified foot, not elsewhere classified: Secondary | ICD-10-CM | POA: Diagnosis not present

## 2012-12-06 DIAGNOSIS — M79609 Pain in unspecified limb: Secondary | ICD-10-CM | POA: Diagnosis not present

## 2012-12-06 DIAGNOSIS — M24873 Other specific joint derangements of unspecified ankle, not elsewhere classified: Secondary | ICD-10-CM | POA: Diagnosis not present

## 2012-12-10 ENCOUNTER — Encounter: Payer: Self-pay | Admitting: Internal Medicine

## 2012-12-19 ENCOUNTER — Telehealth: Payer: Self-pay | Admitting: Internal Medicine

## 2012-12-19 NOTE — Telephone Encounter (Signed)
In Email I found where patient was changed to Crestor 5 mg and  Gave sample, is patient to stay on crestor she needs 90 day supply prescribed please advise.

## 2012-12-19 NOTE — Telephone Encounter (Signed)
Pt says she tried samples of Crestor and they worked great. She sent Dr. Darrick Huntsman a message on MyChart saying she would like a rx sent to [facility] for 90 day supply.

## 2012-12-21 MED ORDER — ROSUVASTATIN CALCIUM 5 MG PO TABS: 5.0000 mg | ORAL_TABLET | Freq: Every day | ORAL | Status: DC

## 2012-12-21 NOTE — Telephone Encounter (Signed)
DONE

## 2012-12-22 NOTE — Telephone Encounter (Signed)
Pt notified via myChart that Rx sent to [facility].

## 2013-01-03 ENCOUNTER — Encounter: Payer: Self-pay | Admitting: Internal Medicine

## 2013-01-06 ENCOUNTER — Telehealth: Payer: Self-pay | Admitting: *Deleted

## 2013-01-06 NOTE — Telephone Encounter (Signed)
Called [phone] for prior authorization on the Crestor, form is being faxed over

## 2013-01-11 ENCOUNTER — Telehealth: Payer: Self-pay | Admitting: Internal Medicine

## 2013-01-11 DIAGNOSIS — E785 Hyperlipidemia, unspecified: Secondary | ICD-10-CM | POA: Insufficient documentation

## 2013-01-16 ENCOUNTER — Telehealth: Payer: Self-pay | Admitting: *Deleted

## 2013-01-16 ENCOUNTER — Other Ambulatory Visit (INDEPENDENT_AMBULATORY_CARE_PROVIDER_SITE_OTHER): Payer: Medicare Other

## 2013-01-16 DIAGNOSIS — E785 Hyperlipidemia, unspecified: Secondary | ICD-10-CM | POA: Diagnosis not present

## 2013-01-16 DIAGNOSIS — Z79899 Other long term (current) drug therapy: Secondary | ICD-10-CM

## 2013-01-16 NOTE — Telephone Encounter (Signed)
Is it just for a lipid? If so what dx code?

## 2013-01-16 NOTE — Telephone Encounter (Signed)
Last lipid dated 11/21/12 so if 8 weeks from that date this would be right minus four days that is the only labs I could findother than her AST and ALT were elelvated in her last CMET.

## 2013-01-16 NOTE — Telephone Encounter (Signed)
i do not know  E mail says 6 to 8 weeks for check on lipids,  July 31st

## 2013-01-16 NOTE — Telephone Encounter (Signed)
What labs and dx?  

## 2013-01-17 LAB — LIPID PANEL
Cholesterol: 217 mg/dL — ABNORMAL HIGH (ref 0–200)
Total CHOL/HDL Ratio: 5
Triglycerides: 186 mg/dL — ABNORMAL HIGH (ref 0.0–149.0)

## 2013-01-17 LAB — COMPREHENSIVE METABOLIC PANEL
Albumin: 4 g/dL (ref 3.5–5.2)
Alkaline Phosphatase: 73 U/L (ref 39–117)
BUN: 12 mg/dL (ref 6–23)
CO2: 26 mEq/L (ref 19–32)
Calcium: 9.6 mg/dL (ref 8.4–10.5)
GFR: 73.87 mL/min (ref >60.00)
Glucose, Bld: 102 mg/dL — ABNORMAL HIGH (ref 70–99)
Potassium: 4.7 mEq/L (ref 3.5–5.1)

## 2013-01-18 ENCOUNTER — Other Ambulatory Visit: Payer: Self-pay

## 2013-01-18 ENCOUNTER — Encounter: Payer: Self-pay | Admitting: Internal Medicine

## 2013-02-11 ENCOUNTER — Encounter: Payer: Self-pay | Admitting: Internal Medicine

## 2013-02-11 ENCOUNTER — Encounter: Payer: Self-pay | Admitting: Pulmonary Disease

## 2013-02-14 ENCOUNTER — Other Ambulatory Visit: Payer: Self-pay | Admitting: *Deleted

## 2013-02-14 MED ORDER — MONTELUKAST SODIUM 10 MG PO TABS: 10.0000 mg | ORAL_TABLET | Freq: Every day | ORAL | Status: DC

## 2013-02-20 ENCOUNTER — Encounter: Payer: Self-pay | Admitting: Pulmonary Disease

## 2013-02-20 MED ORDER — TIOTROPIUM BROMIDE MONOHYDRATE 18 MCG IN CAPS: 18.0000 ug | ORAL_CAPSULE | Freq: Every day | RESPIRATORY_TRACT | Status: DC

## 2013-02-20 NOTE — Telephone Encounter (Signed)
Spoke with patient-- Made patient aware that Rx has been sent for 90 days as requested to verified pharmacy Nothing further needed at this time

## 2013-02-22 ENCOUNTER — Encounter: Payer: Self-pay | Admitting: Internal Medicine

## 2013-02-22 DIAGNOSIS — Z789 Other specified health status: Secondary | ICD-10-CM

## 2013-02-23 DIAGNOSIS — Z789 Other specified health status: Secondary | ICD-10-CM | POA: Insufficient documentation

## 2013-02-23 NOTE — Telephone Encounter (Signed)
Authorization number for patient is [id number] patient notified through [facility] Chart message.

## 2013-03-01 DIAGNOSIS — H4010X0 Unspecified open-angle glaucoma, stage unspecified: Secondary | ICD-10-CM | POA: Diagnosis not present

## 2013-03-09 DIAGNOSIS — Z23 Encounter for immunization: Secondary | ICD-10-CM | POA: Diagnosis not present

## 2013-03-30 ENCOUNTER — Encounter: Payer: Self-pay | Admitting: *Deleted

## 2013-03-31 ENCOUNTER — Other Ambulatory Visit: Payer: Self-pay | Admitting: Internal Medicine

## 2013-03-31 ENCOUNTER — Encounter: Payer: Self-pay | Admitting: Internal Medicine

## 2013-03-31 ENCOUNTER — Ambulatory Visit (INDEPENDENT_AMBULATORY_CARE_PROVIDER_SITE_OTHER): Payer: Medicare Other | Admitting: Internal Medicine

## 2013-03-31 ENCOUNTER — Ambulatory Visit (INDEPENDENT_AMBULATORY_CARE_PROVIDER_SITE_OTHER)
Admission: RE | Admit: 2013-03-31 | Discharge: 2013-03-31 | Disposition: A | Discharge status: Home / Self Care | Payer: Medicare Other | Source: Ambulatory Visit | Attending: Internal Medicine | Admitting: Internal Medicine

## 2013-03-31 VITALS — BP 138/72 | HR 95 | Temp 98.4°F | Resp 12 | Wt 178.0 lb

## 2013-03-31 DIAGNOSIS — M5430 Sciatica, unspecified side: Secondary | ICD-10-CM

## 2013-03-31 DIAGNOSIS — M47817 Spondylosis without myelopathy or radiculopathy, lumbosacral region: Secondary | ICD-10-CM | POA: Diagnosis not present

## 2013-03-31 DIAGNOSIS — M5432 Sciatica, left side: Secondary | ICD-10-CM

## 2013-03-31 DIAGNOSIS — M25559 Pain in unspecified hip: Secondary | ICD-10-CM | POA: Diagnosis not present

## 2013-03-31 DIAGNOSIS — M5137 Other intervertebral disc degeneration, lumbosacral region: Secondary | ICD-10-CM | POA: Diagnosis not present

## 2013-03-31 DIAGNOSIS — K625 Hemorrhage of anus and rectum: Secondary | ICD-10-CM

## 2013-03-31 MED ORDER — MELOXICAM 7.5 MG PO TABS: 7.5000 mg | ORAL_TABLET | Freq: Every day | ORAL | Status: DC

## 2013-03-31 MED ORDER — HYDROCORTISONE ACETATE 25 MG RE SUPP: 25.0000 mg | Freq: Two times a day (BID) | RECTAL | Status: DC

## 2013-03-31 NOTE — Telephone Encounter (Signed)
Okay to refill? See message below

## 2013-03-31 NOTE — Patient Instructions (Addendum)
You can combine meloxicam,  Tramadol,  and tylenol,  The max dose of tylenol  is 2000 mg daily   Tramadol max is 6 daily   Plain films of lumbar spine and left hip   Plain films of spine and hip at [facility]

## 2013-03-31 NOTE — Telephone Encounter (Signed)
Patient went to  Pharmacy to pick up scripts from visit today, needing refill on tramadol. Pharmacy Informed pt that this is a controlled substance and the script has now expired. Patient has contacted pharmacy, will need new script. Patient only has 2 pills left.

## 2013-03-31 NOTE — Progress Notes (Signed)
Patient ID: Traci Hall, female   DOB: June 12, 1946, 67 y.o.   MRN: [id number]   Patient Active Problem List   Diagnosis Date Noted  . Vertebral sciatica of left side 04/01/2013  . Rectal bleeding 04/01/2013  . Statin intolerance 02/23/2013  . Other and unspecified hyperlipidemia 01/11/2013  . Routine general medical examination at a health care facility 11/21/2012  . Acquired pes planus of both feet 11/21/2012  . Hearing loss 11/21/2012  . Hyperglycemia 11/21/2012  . Diverticulitis of colon (without mention of hemorrhage) 11/04/2012  . Pain in joint, shoulder region 09/07/2012  . Barrett's esophagus 09/06/2012  . Breast inflammation 05/25/2012  . Chest pain 05/24/2012  . NASH (nonalcoholic steatohepatitis) 10/02/2011  . COPD (chronic obstructive pulmonary disease) 09/01/2011  . Rhinitis, allergic 09/01/2011  . OSA (obstructive sleep apnea) 08/25/2011    Subjective:  CC:   Chief Complaint  Patient presents with  . Hip Pain    left hip pain  . Rectal Bleeding    2-3 episodes within last week, bright red blood, hemorrhoid?    HPI:   Traci Hall a 67 y.o. female who presents 1) Rectal bleeding.  First episode  occurred after a particularly large bowel movement.  She finally pushed real hard and then had a burning sensation  Followed by [facility]. Had a second episode , but none in the past 3 days. She has a history of internal hemorrhoids .  2) Hip pain .  She has been seeing chiropractor in [place] for hip pain Dr. Clarice Pole and Burney Gauze, that began after a fall in August onto her left hip.  The pain spontaneously resolved after a few days,  But returned once she started taking Crestor for hyperlipidemia.  She is now experiencing numbness down left leg to left toe. Has been taking meloxicam 7.5 mg daily whch she had been prescribed by Orthopedics after her shoulder injury, and it is helping with the pain and not bothering  stomach    Past Medical History  Diagnosis Date  .  Asthma   . Allergic rhinitis   . OSA on CPAP   . COPD (chronic obstructive pulmonary disease)     by prior PFTs  . Hyperlipidemia     Past Surgical History  Procedure Laterality Date  . Nasal sinus surgery  2008  . Appendectomy  1977  . Abdominal hysterectomy  1991    secondary to endometriosis, and polyps       The following portions of the patient's history were reviewed and updated as appropriate: Allergies, current medications, and problem list.    Review of Systems:   12 Pt  review of systems was negative except those addressed in the HPI,     History   Social History  . Marital Status: Married    Spouse Name: N/A    Number of Children: 2  . Years of Education: N/A   Occupational History  . retired     Social History Main Topics  . Smoking status: Former Smoker -- 1.50 packs/day for 45 years    Types: Cigarettes    Quit date: 06/15/2005  . Smokeless tobacco: Never Used  . Alcohol Use: Yes  . Drug Use: No  . Sexual Activity: Not on file   Other Topics Concern  . Not on file   Social History Narrative  . No narrative on file    Objective:  Filed Vitals:   03/31/13 1148  BP: 138/72  Pulse: 95  Temp: 98.4 F (  36.9 C)  Resp: 12     General appearance: alert, cooperative and appears stated age Ears: normal TM's and external ear canals both ears Throat: lips, mucosa, and tongue normal; teeth and gums normal Neck: no adenopathy, no carotid bruit, supple, symmetrical, trachea midline and thyroid not enlarged, symmetric, no tenderness/mass/nodules Back: symmetric, no curvature. ROM normal. No CVA tenderness. Lungs: clear to auscultation bilaterally Heart: regular rate and rhythm, S1, S2 normal, no murmur, click, rub or gallop Abdomen: soft, non-tender; bowel sounds normal; no masses,  no organomegaly Pulses: 2+ and symmetric Skin: Skin color, texture, turgor normal. No rashes or lesions Lymph nodes: Cervical, supraclavicular, and axillary  nodes normal.  Assessment and Plan:  Vertebral sciatica of left side Not responding to therapeutic ultrasound, acupuncture and chiropractic manipulation by Dr. Raford Pitcher .  Will need lumbar films and probably an ultrasound.  Aggravated by Crestor,.  Stop the crestor, continue meloxicam.  Plain films ordered.   Rectal bleeding History of internal hemorrhoids,  Current history suggests hemorrhoidal bleed.,  anusol HC suppositories prn.    Updated Medication List Outpatient Encounter Prescriptions as of 03/31/2013  Medication Sig Dispense Refill  . albuterol (VENTOLIN HFA) 108 (90 BASE) MCG/ACT inhaler Inhale 2 puffs into the lungs every 6 (six) hours as needed.  3 Inhaler  3  . aspirin 81 MG tablet Take 81 mg by mouth daily.      . Bimatoprost (LUMIGAN OP) Apply to eye. 1 drop in each eye p.m.      Marland Kitchen desloratadine (CLARINEX) 5 MG tablet Take 1 tablet (5 mg total) by mouth daily.  90 tablet  3  . Ergocalciferol (VITAMIN D2 PO) Take 1,000 Units by mouth daily.       Marland Kitchen esomeprazole (NEXIUM) 40 MG capsule Take 40 mg by mouth daily before breakfast.      . famotidine (PEPCID) 40 MG tablet Take 40 mg by mouth daily.      . Fluticasone Propionate, Inhal, 100 MCG/BLIST AEPB Inhale 1 puff into the lungs every morning.      . hydrocortisone (ANUSOL-HC) 25 MG suppository Place 25 mg rectally. Prn       . montelukast (SINGULAIR) 10 MG tablet Take 1 tablet (10 mg total) by mouth at bedtime.  90 tablet  3  . Multiple Vitamins-Minerals (CENTRUM SILVER PO) Take by mouth. Take 1 am       . olopatadine (PATANOL) 0.1 % ophthalmic solution Place 1 drop into both eyes as needed.       [name] Glycol-Propyl Glycol (SYSTANE OP) Apply to eye. 1-2 drops in each eye as needed.      . tiotropium (SPIRIVA) 18 MCG inhalation capsule Place 1 capsule (18 mcg total) into inhaler and inhale daily.  90 capsule  2  . vitamin C (ASCORBIC ACID) 500 MG tablet Take 500 mg by mouth daily.      . vitamin E (VITAMIN E) 400 UNIT  capsule Take 400 Units by mouth daily.      Marland Kitchen zolpidem (AMBIEN) 10 MG tablet Take 1 tablet (10 mg total) by mouth at bedtime as needed.  30 tablet  3  . hydrocortisone (ANUSOL-HC) 25 MG suppository Place 1 suppository (25 mg total) rectally 2 (two) times daily.  12 suppository  0  . meloxicam (MOBIC) 7.5 MG tablet Take 1 tablet (7.5 mg total) by mouth daily.  30 tablet  3  . [DISCONTINUED] albuterol (PROVENTIL HFA;VENTOLIN HFA) 108 (90 BASE) MCG/ACT inhaler 2 puffs every am, and then  as needed      . [DISCONTINUED] famotidine (PEPCID) 20 MG tablet Take 20 mg by mouth at bedtime as needed.       . [DISCONTINUED] ondansetron (ZOFRAN) 8 MG tablet Take 1 tablet (8 mg total) by mouth every 8 (eight) hours as needed for nausea.  20 tablet  0  . [DISCONTINUED] rosuvastatin (CRESTOR) 5 MG tablet Take 1 tablet (5 mg total) by mouth daily.  90 tablet  1  . [DISCONTINUED] tiotropium (SPIRIVA) 18 MCG inhalation capsule Place 1 capsule (18 mcg total) into inhaler and inhale daily.  90 capsule  3  . [DISCONTINUED] traMADol (ULTRAM) 50 MG tablet Take 1 tablet (50 mg total) by mouth every 8 (eight) hours as needed for pain.  60 tablet  2   No facility-administered encounter medications on file as of 03/31/2013.     Orders Placed This Encounter  Procedures  . DG Lumbar Spine Complete  . DG Hip Complete Left    No Follow-up on file.

## 2013-04-01 DIAGNOSIS — M48061 Spinal stenosis, lumbar region without neurogenic claudication: Secondary | ICD-10-CM | POA: Insufficient documentation

## 2013-04-01 DIAGNOSIS — K625 Hemorrhage of anus and rectum: Secondary | ICD-10-CM | POA: Insufficient documentation

## 2013-04-01 NOTE — Assessment & Plan Note (Addendum)
Complicated by hip injury. Not responding to therapeutic ultrasound, acupuncture and chiropractic manipulation by Dr. Raford Pitcher .  Will need lumbar films and probably an ultrasound.  Aggravated by Crestor,.  Stop the crestor, continue meloxicam.  Plain films ordered showed normal hip joint,  Sacroiliac degenerative changes and two lievel disk diease and facet arthropathy.  MRI needed. Marland Kitchen

## 2013-04-01 NOTE — Assessment & Plan Note (Signed)
History of internal hemorrhoids,  Current history suggests hemorrhoidal bleed.,  anusol HC suppositories prn.

## 2013-04-03 ENCOUNTER — Encounter: Payer: Self-pay | Admitting: Internal Medicine

## 2013-04-20 DIAGNOSIS — C44101 Unspecified malignant neoplasm of skin of unspecified eyelid, including canthus: Secondary | ICD-10-CM | POA: Diagnosis not present

## 2013-05-19 ENCOUNTER — Encounter: Payer: Self-pay | Admitting: Internal Medicine

## 2013-05-19 ENCOUNTER — Ambulatory Visit (INDEPENDENT_AMBULATORY_CARE_PROVIDER_SITE_OTHER): Payer: Medicare Other | Admitting: Internal Medicine

## 2013-05-19 VITALS — BP 138/60 | HR 79 | Temp 98.6°F | Resp 12 | Ht 62.0 in | Wt 182.0 lb

## 2013-05-19 DIAGNOSIS — K7689 Other specified diseases of liver: Secondary | ICD-10-CM

## 2013-05-19 DIAGNOSIS — E669 Obesity, unspecified: Secondary | ICD-10-CM

## 2013-05-19 DIAGNOSIS — G4733 Obstructive sleep apnea (adult) (pediatric): Secondary | ICD-10-CM

## 2013-05-19 DIAGNOSIS — M5432 Sciatica, left side: Secondary | ICD-10-CM

## 2013-05-19 DIAGNOSIS — R609 Edema, unspecified: Secondary | ICD-10-CM | POA: Diagnosis not present

## 2013-05-19 DIAGNOSIS — E559 Vitamin D deficiency, unspecified: Secondary | ICD-10-CM

## 2013-05-19 DIAGNOSIS — J449 Chronic obstructive pulmonary disease, unspecified: Secondary | ICD-10-CM

## 2013-05-19 DIAGNOSIS — Z789 Other specified health status: Secondary | ICD-10-CM

## 2013-05-19 DIAGNOSIS — K7581 Nonalcoholic steatohepatitis (NASH): Secondary | ICD-10-CM

## 2013-05-19 DIAGNOSIS — Z23 Encounter for immunization: Secondary | ICD-10-CM

## 2013-05-19 DIAGNOSIS — M5430 Sciatica, unspecified side: Secondary | ICD-10-CM

## 2013-05-19 DIAGNOSIS — J4489 Other specified chronic obstructive pulmonary disease: Secondary | ICD-10-CM | POA: Diagnosis not present

## 2013-05-19 DIAGNOSIS — Z888 Allergy status to other drugs, medicaments and biological substances status: Secondary | ICD-10-CM

## 2013-05-19 DIAGNOSIS — R7309 Other abnormal glucose: Secondary | ICD-10-CM

## 2013-05-19 DIAGNOSIS — E785 Hyperlipidemia, unspecified: Secondary | ICD-10-CM

## 2013-05-19 DIAGNOSIS — E039 Hypothyroidism, unspecified: Secondary | ICD-10-CM

## 2013-05-19 DIAGNOSIS — R739 Hyperglycemia, unspecified: Secondary | ICD-10-CM

## 2013-05-19 DIAGNOSIS — K625 Hemorrhage of anus and rectum: Secondary | ICD-10-CM

## 2013-05-19 DIAGNOSIS — K2270 Barrett's esophagus without dysplasia: Secondary | ICD-10-CM

## 2013-05-19 DIAGNOSIS — N61 Mastitis without abscess: Secondary | ICD-10-CM

## 2013-05-19 LAB — LIPID PANEL
Total CHOL/HDL Ratio: 6
VLDL: 47.4 mg/dL — ABNORMAL HIGH (ref 0.0–40.0)

## 2013-05-19 LAB — COMPREHENSIVE METABOLIC PANEL
AST: 59 U/L — ABNORMAL HIGH (ref 0–37)
Albumin: 4.1 g/dL (ref 3.5–5.2)
Alkaline Phosphatase: 73 U/L (ref 39–117)
Chloride: 105 mEq/L (ref 96–112)
Glucose, Bld: 94 mg/dL (ref 70–99)
Potassium: 4.5 mEq/L (ref 3.5–5.1)
Sodium: 140 mEq/L (ref 135–145)
Total Bilirubin: 0.4 mg/dL (ref 0.3–1.2)
Total Protein: 7.2 g/dL (ref 6.0–8.3)

## 2013-05-19 LAB — HEMOGLOBIN A1C: Hgb A1c MFr Bld: 6.1 % (ref 4.6–6.5)

## 2013-05-19 MED ORDER — FUROSEMIDE 20 MG PO TABS: 20.0000 mg | ORAL_TABLET | Freq: Every day | ORAL | Status: DC

## 2013-05-19 NOTE — Assessment & Plan Note (Addendum)
Has appt with Mcqu[facility] in January,  Having more dyspnea with exertion  Using albuterol nightly for orthopnea,  Has not been using advair appropriately due to lack of knowledge.   Advised to increase daily use to twice dailly use.

## 2013-05-19 NOTE — Assessment & Plan Note (Signed)
She is compliant

## 2013-05-19 NOTE — Patient Instructions (Signed)
You need to increase your Advair two one puff twice daily .  This medication does not last 24 hours.  Use furosemide as needed for fluid retention  Get back on a low glycemic index diet to manage the fatty liver and lose the weight  Your goal weight ,  To get your BMI under 30,  Is  163 lbs   Give up the hashbrowns!  You will need to return in one month for your 2nd Hepatitis B vaccine,  And in 6 months for the Hepatitis A/B combined vaccine    All of the foods can be found at grocery stores and in bulk at [employer].  The Atkins protein bars and shakes are available in more varieties at Target, WalMart and [employer]'s Foods.     7 AM Breakfast:  Choose from the following:  Low carbohydrate Protein  Shakes (I recommend the EAS AdvantEdge "Carb Control" shakes  Or the low carb shakes by Atkins.    2.5 carbs   Arnold's "Sandwhich Thin"toasted  w/ peanut butter (no jelly: about 20 net carbs  "Bagel Thin" with cream cheese and salmon: about 20 carbs   a scrambled egg/bacon/cheese burrito made with Mission's "carb balance" whole wheat tortilla  (about 10 net carbs )   Avoid cereal and bananas, oatmeal and cream of wheat and grits. They are loaded with carbohydrates!   10 AM: high protein snack  Protein bar by Atkins (the snack size, under 200 cal, usually < 6 net carbs).    A stick of cheese:  Around 1 carb,  100 cal     Dannon Light n Fit [place] Yogurt  (80 cal, 8 carbs)  Other so called "protein bars" and Greek yogurts tend to be loaded with carbohydrates.  Remember, in food advertising, the word "energy" is synonymous for " carbohydrate."  Lunch:   A Sandwich using the bread choices listed, Can use any  Eggs,  lunchmeat, grilled meat or canned tuna), avocado, regular mayo/mustard  and cheese.  A Salad using blue cheese, ranch,  Goddess or vinagrette,  No croutons or "confetti" and no "candied nuts" but regular nuts OK.   No pretzels or chips.  Pickles and miniature sweet peppers are a good  low carb alternative that provide a "crunch"  The bread is the only source of carbohydrate in a sandwich and  can be decreased by trying some of these alternatives to traditional loaf bread  Joseph's makes a pita bread and a flat bread that are 50 cal and 4 net carbs available at BJs and WalMart.  This can be toasted to use with hummous as well  Toufayan makes a low carb flatbread that's 100 cal and 9 net carbs available at [employer] and [employer] makes 2 sizes of  Low carb whole wheat tortilla  (The large one is 210 cal and 6 net carbs) Avoid "Low fat dressings, as well as Reyne Dumas and [address] dressings They are loaded with sugar!   3 PM/ Mid day  Snack:  Consider  1 ounce of  almonds, walnuts, pistachios, pecans, peanuts,  Macadamia nuts or a nut medley.  Avoid "granola"; the dried cranberries and raisins are loaded with carbohydrates. Mixed nuts as long as there are no raisins,  cranberries or dried fruit.     6 PM  Dinner:     Meat/fowl/fish with a green salad, and either broccoli, cauliflower, green beans, spinach, brussel sprouts or  Lima beans. DO NOT BREAD THE  PROTEIN!!      There is a low carb pasta by Dreamfield's that is acceptable and tastes great: only 5 digestible carbs/serving.( All grocery stores but BJs carry it )  Try Kai Levins Angelo's chicken piccata or chicken or eggplant parm over low carb pasta.(Lowes and BJs)   Clifton Custard Sanchez's "Carnitas" (pulled pork, no sauce,  0 carbs) or his beef pot roast to make a dinner burrito (at BJ's)  Pesto over low carb pasta (bj's sells a good quality pesto in the center refrigerated section of the deli   Whole wheat pasta is still full of digestible carbs and  Not as low in glycemic index as Dreamfield's.   Brown rice is still rice,  So skip the rice and noodles if you eat [place] or [place] (or at least limit to 1/2 cup)  9 PM snack :   Breyer's "low carb" fudgsicle or  ice cream bar (Carb Smart line), or  Weight Watcher's ice  cream bar , or another "no sugar added" ice cream;  a serving of fresh berries/cherries with whipped cream   Cheese or DANNON'S LlGHT N FIT [place] YOGURT  Avoid bananas, pineapple, grapes  and watermelon on a regular basis because they are high in sugar.  THINK OF THEM AS DESSERT  Remember that snack Substitutions should be less than 10 NET carbs per serving and meals < 20 carbs. Remember to subtract fiber grams to get the "net carbs."

## 2013-05-19 NOTE — Progress Notes (Signed)
Patient ID: Traci Hall, female   DOB: 1945-10-25, 67 y.o.   MRN: [id number]   Patient Active Problem List   Diagnosis Date Noted  . Obesity, unspecified 05/21/2013  . Edema 05/19/2013  . Vertebral sciatica of left side 04/01/2013  . Rectal bleeding 04/01/2013  . Statin intolerance 02/23/2013  . Other and unspecified hyperlipidemia 01/11/2013  . Routine general medical examination at a health care facility 11/21/2012  . Acquired pes planus of both feet 11/21/2012  . Hearing loss 11/21/2012  . Hyperglycemia 11/21/2012  . Diverticulitis of colon (without mention of hemorrhage) 11/04/2012  . Pain in joint, shoulder region 09/07/2012  . Barrett's esophagus 09/06/2012  . Breast inflammation 05/25/2012  . Chest pain 05/24/2012  . NASH (nonalcoholic steatohepatitis) 10/02/2011  . COPD (chronic obstructive pulmonary disease) 09/01/2011  . Rhinitis, allergic 09/01/2011  . OSA (obstructive sleep apnea) 08/25/2011    Subjective:  CC:   Chief Complaint  Patient presents with  . Follow-up    6 month    HPI:   Traci Hall a 67 y.o. female who presents for 2 month follow up on chronic issues including sciatica for several months,  New onset edema, fatty liver,.  She is Gaining weight due to inactivity and dietary indiscretions.  Noticing increased edema and orthopnea with current pulmonary regimen,  Not smoking,,  Denies cough. meloxicam offers minimal relief of persistent left sided back pain with radiation.    Past Medical History  Diagnosis Date  . Asthma   . Allergic rhinitis   . OSA on CPAP   . COPD (chronic obstructive pulmonary disease)     by prior PFTs  . Hyperlipidemia     Past Surgical History  Procedure Laterality Date  . Nasal sinus surgery  2008  . Appendectomy  1977  . Abdominal hysterectomy  1991    secondary to endometriosis, and polyps       The following portions of the patient's history were reviewed and updated as appropriate: Allergies,  current medications, and problem list.    Review of Systems:   Patient denies headache, fevers, malaise, unintentional weight loss, skin rash, eye pain, sinus congestion and sinus pain, sore throat, dysphagia,  hemoptysis , cough,, wheezing, chest pain, palpitations,  abdominal pain, nausea, melena, diarrhea, constipation, flank pain, dysuria, hematuria, urinary  Frequency, nocturia, numbness, tingling, seizures,  Focal weakness, Loss of consciousness,  Tremor, insomnia, depression, anxiety, and suicidal ideation.     History   Social History  . Marital Status: Married    Spouse Name: N/A    Number of Children: 2  . Years of Education: N/A   Occupational History  . retired     Social History Main Topics  . Smoking status: Former Smoker -- 1.50 packs/day for 45 years    Types: Cigarettes    Quit date: 06/15/2005  . Smokeless tobacco: Never Used  . Alcohol Use: Yes  . Drug Use: No  . Sexual Activity: Not on file   Other Topics Concern  . Not on file   Social History Narrative  . No narrative on file    Objective:  Filed Vitals:   05/19/13 0944  BP: 138/60  Pulse: 79  Temp: 98.6 F (37 C)  Resp: 12     General appearance: alert, cooperative and appears stated age Ears: normal TM's and external ear canals both ears Throat: lips, mucosa, and tongue normal; teeth and gums normal Neck: no adenopathy, no carotid bruit, supple, symmetrical, trachea midline and  thyroid not enlarged, symmetric, no tenderness/mass/nodules Back: symmetric, no curvature. ROM normal. No CVA tenderness. Lungs: clear to auscultation bilaterally Heart: regular rate and rhythm, S1, S2 normal, no murmur, click, rub or gallop Abdomen: soft, non-tender; bowel sounds normal; no masses,  no organomegaly Pulses: 2+ and symmetric Skin: Skin color, texture, turgor normal. No rashes or lesions Lymph nodes: Cervical, supraclavicular, and axillary nodes normal.  Assessment and Plan:  COPD (chronic  obstructive pulmonary disease) Has appt with [facility] in January,  Having more dyspnea with exertion  Using albuterol nightly for orthopnea,  Has not been using advair appropriately due to lack of knowledge.   Advised to increase daily use to twice dailly use.   OSA (obstructive sleep apnea) She is compliant  Vertebral sciatica of left side She has persistent sciatic pain not improved with nonsteroidals and chiropractic therapy.,  She has  mild to moderate disk space narrowing with osteophyte formations from L4 to S1 suggesting that her pain may be due to spinal stenosis or formainal nerve root impingement ,  MRI of lumbar spine ordered to be done in January.  Statin intolerance Lipids are untreated due to severe arthralgias with trial of low dose crestor.   Rectal bleeding Resolved with use of stool softeners.  Last colonoscopy sept 2013,  Diverticulosis seen,   NASH (nonalcoholic steatohepatitis) Diagnosed by Dr. Bluford Kaufmann,  No follow up.,  Vaccinations for Hep a and B advised and both series were started today.   Barrett's esophagus Found on EGD by [facility] in 2014.  Continue Pepcid bid and Nexium qd.  Repeat EGD in 2017.   Breast inflammation Diagnostic mammogram  and ultrasound of right breast done Dec 2013revealed no mass and inflammation resolved .   Obesity, unspecified She has gained 11 lbs sicne last year and Body mass index is 33.28 kg/(m^2). Not exercising due to sciatica. I have addressed  BMI and recommended wt loss of 10% of body weigh over the next 6 months using a low glycemic index diet and regular exercise a minimum of 5 days per week.     Edema I suspect her edema may be from pulmonary hypertension and obeisty.  Prn furosemide.  Last ECHO was 2009 and EF 60 to 65% (outside hospital)  Type II or unspecified type diabetes mellitus without mention of complication, not stated as uncontrolled Diagnosed with fasting glucose of 140 in May .Well-controlled on diet alone .  hemoglobin  A1c is .6.1. Patient is up-to-date on eye exams and foot exam was done today.  There is  no proteinuria on today's micro urinalysis .  Fasting lipids have been reviewed and statin therapy has been advised and updated according to new ACC guidelines based on patient's 10 year risk of CAD . But she has had intolerance to statin therapy due to arhtralgias.  Lab Results  Component Value Date   HGBA1C 6.1 05/19/2013   Lab Results  Component Value Date   MICROALBUR 0.4 11/21/2012   Lab Results  Component Value Date   CHOL 266* 05/19/2013   HDL 42.40 05/19/2013   LDLDIRECT 200.2 05/19/2013   TRIG 237.0* 05/19/2013   CHOLHDL 6 05/19/2013     Other and unspecified hyperlipidemia untreated secondary to intolerance to every other day crestor.  Given her new onset diabetes will recommend repeat trial of  any statin vs fenofibrate. .  Lab Results  Component Value Date   CHOL 266* 05/19/2013   HDL 42.40 05/19/2013   LDLDIRECT 200.2 05/19/2013   TRIG 237.0*  05/19/2013   CHOLHDL 6 05/19/2013   A total of 40 minutes was spent on patient care, more than half of which was spent in counseling, reviewing records from other providers and coordination of care.  Updated Medication List Outpatient Encounter Prescriptions as of 05/19/2013  Medication Sig  . albuterol (VENTOLIN HFA) 108 (90 BASE) MCG/ACT inhaler Inhale 2 puffs into the lungs every 6 (six) hours as needed.  Marland Kitchen aspirin 81 MG tablet Take 81 mg by mouth daily.  . Bimatoprost (LUMIGAN OP) Apply to eye. 1 drop in each eye p.m.  Marland Kitchen desloratadine (CLARINEX) 5 MG tablet Take 1 tablet (5 mg total) by mouth daily.  . Ergocalciferol (VITAMIN D2 PO) Take 1,000 Units by mouth daily.   Marland Kitchen esomeprazole (NEXIUM) 40 MG capsule Take 40 mg by mouth daily before breakfast.  . famotidine (PEPCID) 40 MG tablet Take 40 mg by mouth daily.  . Fluticasone Propionate, Inhal, 100 MCG/BLIST AEPB Inhale 1 puff into the lungs every morning.  . hydrocortisone (ANUSOL-HC) 25 MG  suppository Place 25 mg rectally. Prn   . hydrocortisone (ANUSOL-HC) 25 MG suppository Place 1 suppository (25 mg total) rectally 2 (two) times daily.  . meloxicam (MOBIC) 7.5 MG tablet Take 1 tablet (7.5 mg total) by mouth daily.  . montelukast (SINGULAIR) 10 MG tablet Take 1 tablet (10 mg total) by mouth at bedtime.  . Multiple Vitamins-Minerals (CENTRUM SILVER PO) Take by mouth. Take 1 am   . olopatadine (PATANOL) 0.1 % ophthalmic solution Place 1 drop into both eyes as needed.   [name] Glycol-Propyl Glycol (SYSTANE OP) Apply to eye. 1-2 drops in each eye as needed.  . tiotropium (SPIRIVA) 18 MCG inhalation capsule Place 1 capsule (18 mcg total) into inhaler and inhale daily.  . traMADol (ULTRAM) 50 MG tablet take 1 tablet by mouth every 8 hours if needed for pain  . vitamin C (ASCORBIC ACID) 500 MG tablet Take 500 mg by mouth daily.  . vitamin E (VITAMIN E) 400 UNIT capsule Take 400 Units by mouth daily.  Marland Kitchen zolpidem (AMBIEN) 10 MG tablet Take 1 tablet (10 mg total) by mouth at bedtime as needed.  . furosemide (LASIX) 20 MG tablet Take 1 tablet (20 mg total) by mouth daily. As needed for fluid retention

## 2013-05-19 NOTE — Progress Notes (Signed)
Pre-visit discussion using our clinic review tool. No additional management support is needed unless otherwise documented below in the visit note.  

## 2013-05-21 ENCOUNTER — Encounter: Payer: Self-pay | Admitting: Internal Medicine

## 2013-05-21 DIAGNOSIS — E669 Obesity, unspecified: Secondary | ICD-10-CM | POA: Insufficient documentation

## 2013-05-21 LAB — HM DIABETES FOOT EXAM: HM Diabetic Foot Exam: NORMAL

## 2013-05-21 NOTE — Assessment & Plan Note (Signed)
untreated secondary to intolerance to every other day crestor.  Given her new onset diabetes will recommend repeat trial of  any statin vs fenofibrate. .  Lab Results  Component Value Date   CHOL 266* 05/19/2013   HDL 42.40 05/19/2013   LDLDIRECT 200.2 05/19/2013   TRIG 237.0* 05/19/2013   CHOLHDL 6 05/19/2013

## 2013-05-21 NOTE — Assessment & Plan Note (Signed)
I suspect her edema may be from pulmonary hypertension and obeisty.  Prn furosemide.  Last ECHO was 2009 and EF 60 to 65% (outside hospital)

## 2013-05-21 NOTE — Assessment & Plan Note (Signed)
Diagnosed by Dr. Bluford Kaufmann,  No follow up.,  Vaccinations for Hep a and B advised and both series were started today.

## 2013-05-21 NOTE — Assessment & Plan Note (Signed)
She has gained 11 lbs sicne last year and Body mass index is 33.28 kg/(m^2). Not exercising due to sciatica. I have addressed  BMI and recommended wt loss of 10% of body weigh over the next 6 months using a low glycemic index diet and regular exercise a minimum of 5 days per week.

## 2013-05-21 NOTE — Assessment & Plan Note (Signed)
Diagnostic mammogram  and ultrasound of right breast done Dec 2013revealed no mass and inflammation resolved .

## 2013-05-21 NOTE — Assessment & Plan Note (Addendum)
Diagnosed with fasting glucose of 140 in May .Well-controlled on diet alone .  hemoglobin A1c is .6.1. Patient is up-to-date on eye exams and foot exam was done today.  There is  no proteinuria on today's micro urinalysis .  Fasting lipids have been reviewed and statin therapy has been advised and updated according to new ACC guidelines based on patient's 10 year risk of CAD . But she has had intolerance to statin therapy due to arhtralgias.  Lab Results  Component Value Date   HGBA1C 6.1 05/19/2013   Lab Results  Component Value Date   MICROALBUR 0.4 11/21/2012   Lab Results  Component Value Date   CHOL 266* 05/19/2013   HDL 42.40 05/19/2013   LDLDIRECT 200.2 05/19/2013   TRIG 237.0* 05/19/2013   CHOLHDL 6 05/19/2013

## 2013-05-21 NOTE — Assessment & Plan Note (Addendum)
Resolved with use of stool softeners.  Last colonoscopy sept 2013,  Diverticulosis seen,

## 2013-05-21 NOTE — Assessment & Plan Note (Addendum)
She has persistent sciatic pain not improved with nonsteroidals and chiropractic therapy.,  She has  mild to moderate disk space narrowing with osteophyte formations from L4 to S1 suggesting that her pain may be due to spinal stenosis or formainal nerve root impingement ,  MRI of lumbar spine ordered to be done in January.

## 2013-05-21 NOTE — Assessment & Plan Note (Signed)
Lipids are untreated due to severe arthralgias with trial of low dose crestor.

## 2013-05-21 NOTE — Assessment & Plan Note (Addendum)
Found on EGD by Oh in 2014.  Continue Pepcid bid and Nexium qd.  Repeat EGD in 2017.

## 2013-05-23 ENCOUNTER — Encounter: Payer: Self-pay | Admitting: Emergency Medicine

## 2013-06-20 ENCOUNTER — Ambulatory Visit: Payer: Medicare Other | Admitting: *Deleted

## 2013-06-20 DIAGNOSIS — Z23 Encounter for immunization: Secondary | ICD-10-CM | POA: Diagnosis not present

## 2013-06-20 DIAGNOSIS — Z789 Other specified health status: Secondary | ICD-10-CM | POA: Diagnosis not present

## 2013-06-22 ENCOUNTER — Ambulatory Visit: Payer: Self-pay | Admitting: Internal Medicine

## 2013-06-22 ENCOUNTER — Telehealth: Payer: Self-pay | Admitting: Internal Medicine

## 2013-06-22 DIAGNOSIS — M48061 Spinal stenosis, lumbar region without neurogenic claudication: Secondary | ICD-10-CM | POA: Diagnosis not present

## 2013-06-22 DIAGNOSIS — L821 Other seborrheic keratosis: Secondary | ICD-10-CM | POA: Diagnosis not present

## 2013-06-22 DIAGNOSIS — M5137 Other intervertebral disc degeneration, lumbosacral region: Secondary | ICD-10-CM | POA: Diagnosis not present

## 2013-06-22 DIAGNOSIS — M5126 Other intervertebral disc displacement, lumbar region: Secondary | ICD-10-CM | POA: Diagnosis not present

## 2013-06-22 DIAGNOSIS — C44101 Unspecified malignant neoplasm of skin of unspecified eyelid, including canthus: Secondary | ICD-10-CM | POA: Diagnosis not present

## 2013-06-22 DIAGNOSIS — M5432 Sciatica, left side: Secondary | ICD-10-CM

## 2013-06-22 DIAGNOSIS — M4120 Other idiopathic scoliosis, site unspecified: Secondary | ICD-10-CM | POA: Diagnosis not present

## 2013-06-22 NOTE — Assessment & Plan Note (Signed)
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  Referral to Neurosurgery is advised.

## 2013-06-22 NOTE — Telephone Encounter (Signed)
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  Referral to Neurosurgery is advised. Does she have a preference to whom I refer?

## 2013-06-23 NOTE — Telephone Encounter (Signed)
Referral is in process as requested  And copy of report in my office

## 2013-06-23 NOTE — Telephone Encounter (Signed)
Patient notified of results and she trust your judgment for a neuro referral she would like the best recommendation you would give. Also patient would like a copy of report if it has not gone to scan please advise.

## 2013-06-23 NOTE — Telephone Encounter (Signed)
Copy mailed to patient as requested.

## 2013-06-27 ENCOUNTER — Ambulatory Visit (INDEPENDENT_AMBULATORY_CARE_PROVIDER_SITE_OTHER): Payer: Medicare Other | Admitting: Pulmonary Disease

## 2013-06-27 ENCOUNTER — Encounter: Payer: Self-pay | Admitting: Pulmonary Disease

## 2013-06-27 ENCOUNTER — Telehealth: Payer: Self-pay | Admitting: Internal Medicine

## 2013-06-27 VITALS — BP 142/86 | HR 78 | Temp 98.7°F | Ht 62.0 in | Wt 180.0 lb

## 2013-06-27 DIAGNOSIS — J449 Chronic obstructive pulmonary disease, unspecified: Secondary | ICD-10-CM

## 2013-06-27 DIAGNOSIS — G4733 Obstructive sleep apnea (adult) (pediatric): Secondary | ICD-10-CM | POA: Diagnosis not present

## 2013-06-27 DIAGNOSIS — R0989 Other specified symptoms and signs involving the circulatory and respiratory systems: Secondary | ICD-10-CM

## 2013-06-27 DIAGNOSIS — R0609 Other forms of dyspnea: Secondary | ICD-10-CM | POA: Diagnosis not present

## 2013-06-27 DIAGNOSIS — R0600 Dyspnea, unspecified: Secondary | ICD-10-CM | POA: Insufficient documentation

## 2013-06-27 NOTE — Assessment & Plan Note (Signed)
As noted above in COPD.  Will order imaging as detailed above.

## 2013-06-27 NOTE — Telephone Encounter (Signed)
Please call pt when you have her neuro appointment   Do not use my chart pt doesn't every day

## 2013-06-27 NOTE — Patient Instructions (Signed)
Try Breo daily for a month and let me know how you feel When taking Breo, stop the Spiriva and Flovent  If you decide to NOT undergo lung cancer screening, let me know so that we can order a Chest X-ray  Use albuterol in the morning just after taking off the CPAP mask  If you decide to go to pulmonary rehab let me know  We will see you back in 6 months or sooner if needed

## 2013-06-27 NOTE — Assessment & Plan Note (Signed)
She describes some dyspnea after taking the mask off first thing in the morning.  Like Dr. Gwenette Greet, I question breath stacking leading to this.   Plan: -I recommended albuterol first thing in the morning after removing the mask

## 2013-06-27 NOTE — Assessment & Plan Note (Addendum)
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  She has noted a slight worsening in dyspnea over the last few months.  This is likely due to decline in lung function, but because the ddx of worsening dyspnea in a prior smoker includes lung cancer she needs imaging.    Plan: -will switch to Breo inhaled for a month to see if this helps with dyspnea better -if Breo not better, will go with Spiriva daily and Flovent BID (not daily as she does now) -Have encouraged that she undergo lung cancer screening and gave her paperwork on this today -if she doesn't have the lung cancer screening CT, then we will order a Chest X-ray -encouraged pulmonary rehab, she defers this decision for now due to a herniated disc and possible surgery

## 2013-06-27 NOTE — Progress Notes (Signed)
Subjective:    Patient ID: Traci Hall, female    DOB: January 05, 1946, 68 y.o.   MRN: [id number]  Synopsis: Traci Hall first saw [facility] in 08/2011 for shortness of breath.  She was diagnosed with Grade A COPD.  She previously smoked 1.5 ppd for 45 years.  She has allergic rhinitis as well.  HPI   11/16/11 ROV -- Traci Hall has been doing very well.  She only had to use her albuterol a few times in the last few months due to chest congestion, but really has not had any trouble breathing.  She feels that she could stop the singulair.  She is taking the flovent once per day.  She continues to use the spiriva once per day.  05/24/2012 -- Traci Hall started using the Flovent at night a few months ago when she was having more chest congestion.  It doesn't sound like the flovent helped consistently, but when she started using the singulair again things really cleared up.  She was having a lot of sinus congestion as well.  This was back in the fall. She continues to use the Spiriva daily and she has not had bronchitis since using it.   She has been having a burning sensation in the R chest for the last two weeks.  It is distinct from her GERD symptoms. It is constant and not exacerbated by deep breathing or exercise.  She does not have new cough or dyspnea.  She has some swelling and redness in her breast since a shopping trip a week ago.  She is to see Dr. Derrel Nip tomorrow for this.   11/15/2012 ROV >> [place] has been doing well since the last visit. She has been walking more lately. She states that she takes albuterol 2 puffs about 10 minutes before going for a walk. She can walk up to 1/2 mile at a time without stopping. She does have some difficulty with dyspnea while walking. In general though she's had very little respiratory complaints. She does not have cough. She recovered from an episode of diverticulitis several weeks ago and is now doing well.  06/27/2013 ROV >> Marquia says that they  went to [place] this winter. When travelling she needed to turn on the [facility] because she felt a lot of shortness of breath and chest pressure.  She felt the elephant on her chest.  This was helpedwith air conditioned.  This has not happened in [place]. No trouble walking or getting around while there.  Since the last visit she has noticed more shortness of breath with just walking around in the house. She had been taking the Flovent once a day up until recently when Dr. Derrel Nip. She has not switched yet.  She has noticed more chest tightness as well.  She has used her albuterol more often as well, typically once a day, rarely an extra dose during the day maybe once every 1-2 weeks. She sometimes fells shortness of breath in the morning    Past Medical History  Diagnosis Date  . Asthma   . Allergic rhinitis   . OSA on CPAP   . COPD (chronic obstructive pulmonary disease)     by prior PFTs  . Hyperlipidemia       Review of Systems  Constitutional: Negative for fever, chills and unexpected weight change.  HENT: Negative for ear pain, nosebleeds, postnasal drip and rhinorrhea.   Respiratory: Positive for chest tightness and shortness of breath. Negative for cough and choking.  Cardiovascular: Negative for chest pain and leg swelling.       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  She has noted a slight worsening in dyspnea over the last few months.  This is likely due to decline in  lung function, but because the ddx of worsening dyspnea in a prior smoker includes lung cancer she needs imaging.    Plan: -will switch to Breo inhaled for a month to see if this helps with dyspnea better -if Breo not better, will go with Spiriva daily and Flovent BID (not daily as she does now) -Have encouraged that she undergo lung cancer screening and gave her paperwork on this today -if she doesn't have the lung cancer screening CT, then we will order a Chest X-ray -encouraged pulmonary rehab, she defers this decision for now due to a herniated disc and possible surgery  OSA (obstructive sleep apnea) She describes some dyspnea after taking the mask off first thing in the morning.  Like Dr. Gwenette Greet, I question breath stacking leading to this.   Plan: -I recommended albuterol first thing in the morning after removing the mask  Dyspnea As noted above in COPD.  Will order imaging as detailed above.    Updated Medication List Outpatient Encounter Prescriptions as of 06/27/2013  Medication Sig  . albuterol (VENTOLIN HFA) 108 (90 BASE) MCG/ACT inhaler Inhale 2 puffs into the lungs every 6 (six) hours as needed.  Marland Kitchen aspirin 81 MG tablet Take 81 mg by mouth daily.  . Bimatoprost (LUMIGAN OP) Apply to eye. 1 drop in each eye p.m.  Marland Kitchen desloratadine (CLARINEX) 5 MG tablet Take 1 tablet (5 mg total) by mouth daily.  . Ergocalciferol (VITAMIN D2 PO) Take 1,000 Units by mouth daily.   Marland Kitchen esomeprazole (NEXIUM) 40 MG capsule Take 40 mg by mouth daily before breakfast.  . famotidine (PEPCID) 40 MG tablet Take 40 mg by mouth daily.  . Fluticasone Propionate, Inhal, 100 MCG/BLIST AEPB Inhale 1 puff into the lungs every morning.  . furosemide (LASIX) 20 MG tablet Take 1 tablet (20 mg total) by mouth daily. As needed for fluid retention  . hydrocortisone (ANUSOL-HC) 25 MG suppository Place 25 mg rectally. Prn   . meloxicam (MOBIC) 7.5 MG tablet Take 1 tablet (7.5 mg total) by mouth daily.  . montelukast  (SINGULAIR) 10 MG tablet Take 1 tablet (10 mg total) by mouth at bedtime.  . Multiple Vitamins-Minerals (CENTRUM SILVER PO) Take by mouth. Take 1 am   . olopatadine (PATANOL) 0.1 % ophthalmic solution Place 1 drop into both eyes as needed.   . Omega-3 Fatty Acids (FISH OIL) 1200 MG CAPS Take 1,200 mg by mouth 2 (two) times daily.  [name] Glycol-Propyl Glycol (SYSTANE OP) Apply to eye. 1-2 drops in each eye as needed.  . tiotropium (SPIRIVA) 18 MCG inhalation capsule Place 1 capsule (18 mcg total) into inhaler and  inhale daily.  . traMADol (ULTRAM) 50 MG tablet take 1 tablet by mouth every 8 hours if needed for pain  . vitamin C (ASCORBIC ACID) 500 MG tablet Take 500 mg by mouth daily.  . vitamin E (VITAMIN E) 400 UNIT capsule Take 400 Units by mouth daily.  . Wheat Dextrin (BENEFIBER DRINK MIX PO) Take by mouth. 2 teaspoons mixed into drink daily  . zolpidem (AMBIEN) 10 MG tablet Take 1 tablet (10 mg total) by mouth at bedtime as needed.  . [DISCONTINUED] hydrocortisone (ANUSOL-HC) 25 MG suppository Place 1 suppository (25 mg total) rectally 2 (two) times daily.

## 2013-06-28 ENCOUNTER — Telehealth: Payer: Self-pay | Admitting: Internal Medicine

## 2013-06-28 NOTE — Telephone Encounter (Signed)
Relevant patient education assigned to patient using Emmi. ° °

## 2013-06-29 DIAGNOSIS — M5126 Other intervertebral disc displacement, lumbar region: Secondary | ICD-10-CM | POA: Diagnosis not present

## 2013-06-29 DIAGNOSIS — IMO0002 Reserved for concepts with insufficient information to code with codable children: Secondary | ICD-10-CM | POA: Diagnosis not present

## 2013-07-01 ENCOUNTER — Encounter: Payer: Self-pay | Admitting: Pulmonary Disease

## 2013-07-01 ENCOUNTER — Encounter: Payer: Self-pay | Admitting: Internal Medicine

## 2013-07-03 ENCOUNTER — Other Ambulatory Visit: Payer: Self-pay | Admitting: Pulmonary Disease

## 2013-07-03 DIAGNOSIS — J449 Chronic obstructive pulmonary disease, unspecified: Secondary | ICD-10-CM

## 2013-07-03 NOTE — Telephone Encounter (Signed)
BQ, please advise regarding pulmonary rehab order from [place] message.

## 2013-07-04 MED ORDER — MELOXICAM 15 MG PO TABS: 15.0000 mg | ORAL_TABLET | Freq: Every day | ORAL | Status: DC

## 2013-07-17 MED ORDER — MELOXICAM 15 MG PO TABS: 15.0000 mg | ORAL_TABLET | Freq: Every day | ORAL | Status: DC

## 2013-07-17 NOTE — Telephone Encounter (Addendum)
See myChart message, ok to send Meloxicam to [facility]?

## 2013-07-17 NOTE — Addendum Note (Signed)
Addended by: Wynonia Lawman E on: 07/17/2013 04:10 PM   Modules accepted: Orders

## 2013-07-17 NOTE — Addendum Note (Signed)
Addended by: [name] on: 07/17/2013 07:13 PM   Modules accepted: Orders

## 2013-07-18 ENCOUNTER — Encounter: Payer: Self-pay | Admitting: Pulmonary Disease

## 2013-07-18 DIAGNOSIS — G4730 Sleep apnea, unspecified: Secondary | ICD-10-CM | POA: Diagnosis not present

## 2013-07-18 DIAGNOSIS — J449 Chronic obstructive pulmonary disease, unspecified: Secondary | ICD-10-CM | POA: Diagnosis not present

## 2013-07-18 DIAGNOSIS — Z5189 Encounter for other specified aftercare: Secondary | ICD-10-CM | POA: Diagnosis not present

## 2013-07-18 LAB — PULMONARY FUNCTION TEST

## 2013-07-20 ENCOUNTER — Encounter: Payer: Self-pay | Admitting: Internal Medicine

## 2013-07-20 MED ORDER — MELOXICAM 15 MG PO TABS: 15.0000 mg | ORAL_TABLET | Freq: Every day | ORAL | Status: DC

## 2013-07-20 NOTE — Addendum Note (Signed)
Addended by: Wynonia Lawman E on: 07/20/2013 02:39 PM   Modules accepted: Orders

## 2013-07-25 DIAGNOSIS — L819 Disorder of pigmentation, unspecified: Secondary | ICD-10-CM | POA: Diagnosis not present

## 2013-07-25 DIAGNOSIS — D485 Neoplasm of uncertain behavior of skin: Secondary | ICD-10-CM | POA: Diagnosis not present

## 2013-07-25 DIAGNOSIS — Z85828 Personal history of other malignant neoplasm of skin: Secondary | ICD-10-CM | POA: Diagnosis not present

## 2013-07-25 DIAGNOSIS — L909 Atrophic disorder of skin, unspecified: Secondary | ICD-10-CM | POA: Diagnosis not present

## 2013-07-25 DIAGNOSIS — D1800 Hemangioma unspecified site: Secondary | ICD-10-CM | POA: Diagnosis not present

## 2013-07-25 DIAGNOSIS — C44319 Basal cell carcinoma of skin of other parts of face: Secondary | ICD-10-CM | POA: Diagnosis not present

## 2013-07-25 DIAGNOSIS — L919 Hypertrophic disorder of the skin, unspecified: Secondary | ICD-10-CM | POA: Diagnosis not present

## 2013-08-03 ENCOUNTER — Encounter: Payer: Self-pay | Admitting: Pulmonary Disease

## 2013-08-11 ENCOUNTER — Encounter: Payer: Self-pay | Admitting: Pulmonary Disease

## 2013-08-11 MED ORDER — FLUTICASONE FUROATE-VILANTEROL 100-25 MCG/INH IN AEPB: 1.0000 | INHALATION_SPRAY | Freq: Every day | RESPIRATORY_TRACT | Status: DC

## 2013-08-13 ENCOUNTER — Encounter: Payer: Self-pay | Admitting: Pulmonary Disease

## 2013-08-13 DIAGNOSIS — Z5189 Encounter for other specified aftercare: Secondary | ICD-10-CM | POA: Diagnosis not present

## 2013-08-13 DIAGNOSIS — J449 Chronic obstructive pulmonary disease, unspecified: Secondary | ICD-10-CM | POA: Diagnosis not present

## 2013-08-14 ENCOUNTER — Telehealth: Payer: Self-pay | Admitting: Internal Medicine

## 2013-08-14 ENCOUNTER — Ambulatory Visit (INDEPENDENT_AMBULATORY_CARE_PROVIDER_SITE_OTHER): Payer: Medicare Other | Admitting: *Deleted

## 2013-08-14 DIAGNOSIS — Z23 Encounter for immunization: Secondary | ICD-10-CM | POA: Diagnosis not present

## 2013-08-14 NOTE — Telephone Encounter (Signed)
Pt came in with her spouse this afternoon.  And wanted to know if dr [facility] office left any breo samples for her.  Please advise pt

## 2013-08-15 ENCOUNTER — Encounter: Payer: Self-pay | Admitting: Pulmonary Disease

## 2013-08-15 DIAGNOSIS — K7689 Other specified diseases of liver: Secondary | ICD-10-CM | POA: Diagnosis not present

## 2013-08-15 DIAGNOSIS — K219 Gastro-esophageal reflux disease without esophagitis: Secondary | ICD-10-CM | POA: Diagnosis not present

## 2013-08-15 NOTE — Telephone Encounter (Signed)
Spoke with pt, samples have been set aside.  She will pick up tomorrow.  Nothing further needed.

## 2013-08-18 ENCOUNTER — Telehealth: Payer: Self-pay | Admitting: Pulmonary Disease

## 2013-08-18 NOTE — Telephone Encounter (Signed)
Spoke with patient-she is aware that I am sending message to [place] states she gave Caryl Pina the PA information in [place] office and wants to know the outcome.    [facility] PA department: [phone].

## 2013-08-21 DIAGNOSIS — C44319 Basal cell carcinoma of skin of other parts of face: Secondary | ICD-10-CM | POA: Diagnosis not present

## 2013-08-21 DIAGNOSIS — L723 Sebaceous cyst: Secondary | ICD-10-CM | POA: Diagnosis not present

## 2013-08-21 NOTE — Telephone Encounter (Signed)
Spoke to pt.  Will [facility] today to check on status of PA today and will relay info to pt.

## 2013-08-22 ENCOUNTER — Other Ambulatory Visit: Payer: Self-pay

## 2013-08-22 LAB — HM DIABETES EYE EXAM

## 2013-08-22 MED ORDER — FLUTICASONE FUROATE-VILANTEROL 100-25 MCG/INH IN AEPB: 1.0000 | INHALATION_SPRAY | Freq: Every day | RESPIRATORY_TRACT | Status: DC

## 2013-08-22 NOTE — Telephone Encounter (Signed)
[facility] is approved indefinitely.  Reference # [id number].  Pt aware.  Resent rx to [facility].  Nothing further needed.

## 2013-08-24 ENCOUNTER — Ambulatory Visit: Payer: Medicare Other | Admitting: Pulmonary Disease

## 2013-08-29 ENCOUNTER — Ambulatory Visit (INDEPENDENT_AMBULATORY_CARE_PROVIDER_SITE_OTHER): Payer: Medicare Other | Admitting: Pulmonary Disease

## 2013-08-29 ENCOUNTER — Encounter: Payer: Self-pay | Admitting: Pulmonary Disease

## 2013-08-29 VITALS — BP 144/82 | HR 74 | Temp 98.3°F | Ht 62.0 in | Wt 183.0 lb

## 2013-08-29 DIAGNOSIS — G4733 Obstructive sleep apnea (adult) (pediatric): Secondary | ICD-10-CM

## 2013-08-29 NOTE — Progress Notes (Signed)
   Subjective:    Patient ID: Traci Hall, female    DOB: [birth year], 68 y.o.   MRN: [id number]  HPI Patient comes in today for followup of her known sleep apnea. She is wearing CPAP compliantly, and likes the automatic setting. She feels that she sleeps well with the device, and is not having any significant daytime alertness issues. She denies any significant mask issues. She was having some problems with air trapping related to her obstructive lung disease, but this seems to be better.   Review of Systems  Constitutional: Negative for fever and unexpected weight change.  HENT: Positive for congestion and sneezing. Negative for dental problem, ear pain, nosebleeds, postnasal drip, rhinorrhea, sinus pressure, sore throat and trouble swallowing.   Eyes: Negative for redness and itching.  Respiratory: Negative for cough, chest tightness, shortness of breath and wheezing.   Cardiovascular: Negative for palpitations and leg swelling.  Gastrointestinal: Negative for nausea and vomiting.  Genitourinary: Negative for dysuria.  Musculoskeletal: Negative for joint swelling.  Skin: Negative for rash.  Neurological: Negative for headaches.  Hematological: Does not bruise/bleed easily.  Psychiatric/Behavioral: Negative for dysphoric mood. The patient is not nervous/anxious.        Objective:   Physical Exam Overweight female in no acute distress Nose without purulence or discharge noted No skin breakdown or pressure necrosis from the CPAP mask Neck without lymphadenopathy or thyromegaly Lower extremities with mild edema, no cyanosis Alert and oriented, moves all 4 extremities.       Assessment & Plan:

## 2013-08-29 NOTE — Assessment & Plan Note (Signed)
The patient is doing well with CPAP, and is satisfied with her sleep and daytime alertness. I've asked her to keep up with her mask cushion changes and supplies, and to work aggressively on weight loss. I will see her again in one year.

## 2013-08-29 NOTE — Patient Instructions (Signed)
Continue with cpap, and keep up with mask changes and supplies. Keep working on weight loss. followup with me again in one year  

## 2013-08-31 DIAGNOSIS — K7689 Other specified diseases of liver: Secondary | ICD-10-CM | POA: Diagnosis not present

## 2013-09-05 DIAGNOSIS — H4010X0 Unspecified open-angle glaucoma, stage unspecified: Secondary | ICD-10-CM | POA: Diagnosis not present

## 2013-09-13 ENCOUNTER — Encounter: Payer: Self-pay | Admitting: Pulmonary Disease

## 2013-09-13 DIAGNOSIS — Z5189 Encounter for other specified aftercare: Secondary | ICD-10-CM | POA: Diagnosis not present

## 2013-09-13 DIAGNOSIS — G4730 Sleep apnea, unspecified: Secondary | ICD-10-CM | POA: Diagnosis not present

## 2013-09-13 DIAGNOSIS — J449 Chronic obstructive pulmonary disease, unspecified: Secondary | ICD-10-CM | POA: Diagnosis not present

## 2013-09-21 DIAGNOSIS — H60509 Unspecified acute noninfective otitis externa, unspecified ear: Secondary | ICD-10-CM | POA: Diagnosis not present

## 2013-09-23 ENCOUNTER — Encounter: Payer: Self-pay | Admitting: Internal Medicine

## 2013-10-13 ENCOUNTER — Encounter: Payer: Self-pay | Admitting: Pulmonary Disease

## 2013-10-13 DIAGNOSIS — Z5189 Encounter for other specified aftercare: Secondary | ICD-10-CM | POA: Diagnosis not present

## 2013-10-13 DIAGNOSIS — G4730 Sleep apnea, unspecified: Secondary | ICD-10-CM | POA: Diagnosis not present

## 2013-10-13 DIAGNOSIS — J449 Chronic obstructive pulmonary disease, unspecified: Secondary | ICD-10-CM | POA: Diagnosis not present

## 2013-11-11 ENCOUNTER — Encounter: Payer: Self-pay | Admitting: Pulmonary Disease

## 2013-11-13 ENCOUNTER — Encounter: Payer: Self-pay | Admitting: Pulmonary Disease

## 2013-11-13 DIAGNOSIS — Z5189 Encounter for other specified aftercare: Secondary | ICD-10-CM | POA: Diagnosis not present

## 2013-11-13 DIAGNOSIS — J449 Chronic obstructive pulmonary disease, unspecified: Secondary | ICD-10-CM | POA: Diagnosis not present

## 2013-11-13 MED ORDER — DESLORATADINE 5 MG PO TABS: 5.0000 mg | ORAL_TABLET | Freq: Every day | ORAL | Status: DC

## 2013-11-13 NOTE — Telephone Encounter (Signed)
Refill sent to [facility] for Desloratidine 5mg  #90 x 3RF Pt aware via MyChart E mail  Nothing further needed.

## 2013-11-17 ENCOUNTER — Ambulatory Visit (INDEPENDENT_AMBULATORY_CARE_PROVIDER_SITE_OTHER): Payer: Medicare Other | Admitting: *Deleted

## 2013-11-17 DIAGNOSIS — Z23 Encounter for immunization: Secondary | ICD-10-CM | POA: Diagnosis not present

## 2013-11-20 DIAGNOSIS — Z5189 Encounter for other specified aftercare: Secondary | ICD-10-CM | POA: Diagnosis not present

## 2013-11-20 DIAGNOSIS — J449 Chronic obstructive pulmonary disease, unspecified: Secondary | ICD-10-CM | POA: Diagnosis not present

## 2013-11-22 ENCOUNTER — Encounter: Payer: Self-pay | Admitting: Internal Medicine

## 2013-11-22 ENCOUNTER — Encounter: Payer: Medicare Other | Admitting: Internal Medicine

## 2013-11-22 ENCOUNTER — Ambulatory Visit (INDEPENDENT_AMBULATORY_CARE_PROVIDER_SITE_OTHER): Payer: Medicare Other | Admitting: Internal Medicine

## 2013-11-22 VITALS — BP 146/88 | HR 80 | Temp 98.2°F | Resp 18 | Ht 62.25 in | Wt 181.5 lb

## 2013-11-22 DIAGNOSIS — E119 Type 2 diabetes mellitus without complications: Secondary | ICD-10-CM

## 2013-11-22 DIAGNOSIS — Z0000 Encounter for general adult medical examination without abnormal findings: Secondary | ICD-10-CM | POA: Diagnosis not present

## 2013-11-22 DIAGNOSIS — J449 Chronic obstructive pulmonary disease, unspecified: Secondary | ICD-10-CM | POA: Diagnosis not present

## 2013-11-22 DIAGNOSIS — R5383 Other fatigue: Secondary | ICD-10-CM

## 2013-11-22 DIAGNOSIS — R5381 Other malaise: Secondary | ICD-10-CM

## 2013-11-22 DIAGNOSIS — K7689 Other specified diseases of liver: Secondary | ICD-10-CM

## 2013-11-22 DIAGNOSIS — Z1239 Encounter for other screening for malignant neoplasm of breast: Secondary | ICD-10-CM

## 2013-11-22 DIAGNOSIS — Z5189 Encounter for other specified aftercare: Secondary | ICD-10-CM | POA: Diagnosis not present

## 2013-11-22 DIAGNOSIS — K7581 Nonalcoholic steatohepatitis (NASH): Secondary | ICD-10-CM

## 2013-11-22 DIAGNOSIS — Z789 Other specified health status: Secondary | ICD-10-CM

## 2013-11-22 LAB — HM DIABETES FOOT EXAM: HM Diabetic Foot Exam: NORMAL

## 2013-11-22 MED ORDER — TRAMADOL HCL 50 MG PO TABS: 50.0000 mg | ORAL_TABLET | Freq: Four times a day (QID) | ORAL | Status: DC | PRN

## 2013-11-22 NOTE — Progress Notes (Signed)
Patient ID: Traci Hall, female   DOB: [birth year], 68 y.o.   MRN: [id number]  The patient is here for annual Medicare wellness examination and management of other chronic and acute problems.  Painful lump right breastgpoing to [facility] for PT.  Back pian with sciatica to left leg,  Post neurosurgerical eval,  No surgery planned  Better with chiropractor and acupuncture by Dr Oswaldo Milian     Wearing hearing aids now in social situations.  No recent falls.    The risk factors are reflected in the social history.  The roster of all physicians providing medical care to patient - is listed in the Snapshot section of the chart.  Activities of daily living:  The patient is 100% independent in all ADLs: dressing, toileting, feeding as well as independent mobility  Home safety : The patient has smoke detectors in the home. They wear seatbelts.  There are no firearms at home. There is no violence in the home.   There is no risks for hepatitis, STDs or HIV. There is no   history of blood transfusion. They have no travel history to infectious disease endemic areas of the world.  The patient has seen their dentist in the last six month. They have seen their eye doctor in the last year. They admit to slight hearing difficulty with regard to whispered voices and some television programs.  They have deferred audiologic testing in the last year.  They do not  have excessive sun exposure. Discussed the need for sun protection: hats, long sleeves and use of sunscreen if there is significant sun exposure.   Diet: the importance of a healthy diet is discussed. They do have a healthy diet.  The benefits of regular aerobic exercise were discussed. She walks 4 times per week ,  20 minutes.   Depression screen: there are no signs or vegative symptoms of depression- irritability, change in appetite, anhedonia, sadness/tearfullness.  Cognitive assessment: the patient manages all their financial and personal  affairs and is actively engaged. They could relate day,date,year and events; recalled 2/3 objects at 3 minutes; performed clock-face test normally.  The following portions of the patient's history were reviewed and updated as appropriate: allergies, current medications, past family history, past medical history,  past surgical history, past social history  and problem list.  Visual acuity was not assessed per patient preference since she has regular follow up with her ophthalmologist. Hearing and body mass index were assessed and reviewed.   During the course of the visit the patient was educated and counseled about appropriate screening and preventive services including : fall prevention , diabetes screening, nutrition counseling, colorectal cancer screening, and recommended immunizations.    Objective:  BP 146/88  Pulse 80  Temp(Src) 98.2 F (36.8 C) (Oral)  Resp 18  Ht 5' 2.25" (1.581 m)  Wt 181 lb 8 oz (82.328 kg)  BMI 32.94 kg/m2  SpO2 97%  General appearance: alert, cooperative and appears stated age Head: Normocephalic, without obvious abnormality, atraumatic Eyes: conjunctivae/corneas clear. PERRL, EOM's intact. Fundi benign. Ears: normal TM's and external ear canals both ears Nose: Nares normal. Septum midline. Mucosa normal. No drainage or sinus tenderness. Throat: lips, mucosa, and tongue normal; teeth and gums normal Neck: no adenopathy, no carotid bruit, no JVD, supple, symmetrical, trachea midline and thyroid not enlarged, symmetric, no tenderness/mass/nodules Lungs: clear to auscultation bilaterally Breasts: normal appearance, no masses or tenderness Heart: regular rate and rhythm, S1, S2 normal, no murmur, click, rub or gallop Abdomen:  soft, non-tender; bowel sounds normal; no masses,  no organomegaly Extremities: extremities normal, atraumatic, no cyanosis or edema Pulses: 2+ and symmetric Skin: Skin color, texture, turgor normal. No rashes or lesions Neurologic:  Alert and oriented X 3, normal strength and tone. Normal symmetric reflexes. Normal coordination and gait.   Assessment and Plan:   Type II or unspecified type diabetes mellitus without mention of complication, not stated as uncontrolled Diagnosed with fasting glucose of 140 in May 2014. Well-controlled on diet alone . Patient is up-to-date on eye exams and foot exam was done today.  There is  no proteinuria on today's micro urinalysis .  Fasting lipids are elevated; statin therapy has been prescribed in the past but not tolerated arthralgias.   Lab Results  Component Value Date   HGBA1C 6.0 11/23/2013   Lab Results  Component Value Date   MICROALBUR 0.4 11/21/2012   Lab Results  Component Value Date   CHOL 232* 11/23/2013   HDL 42.30 11/23/2013   LDLCALC 156* 11/23/2013   LDLDIRECT 200.2 05/19/2013   TRIG 167.0* 11/23/2013   CHOLHDL 5 11/23/2013       Routine general medical examination at a health care facility Annual comprehensive exam was done including breast, excluding pelvic and PAP smear. All screenings have been addressed .   NASH (nonalcoholic steatohepatitis) Diagnosed by Dr. Candace Cruise.   Vaccinations for Hep A and B completed. I have recommended a low glycemic index diet utilizing smaller more frequent meals to increase metabolism.  I have also recommended that patient start exercising with a goal of 30 minutes of aerobic exercise a minimum of 5 days per week.     Lab Results  Component Value Date   ALT 59* 11/23/2013   AST 45* 11/23/2013   ALKPHOS 73 11/23/2013   BILITOT 0.6 11/23/2013       Updated Medication List Outpatient Encounter Prescriptions as of 11/22/2013  Medication Sig  . albuterol (VENTOLIN HFA) 108 (90 BASE) MCG/ACT inhaler Inhale 2 puffs into the lungs every 6 (six) hours as needed.  Marland Kitchen aspirin 81 MG tablet Take 81 mg by mouth daily.  . Bimatoprost (LUMIGAN OP) Apply to eye. 1 drop in each eye p.m.  Marland Kitchen Cholecalciferol (VITAMIN D-3) 1000 UNITS CAPS Take 1  capsule by mouth daily.  Marland Kitchen desloratadine (CLARINEX) 5 MG tablet Take 1 tablet (5 mg total) by mouth daily.  Marland Kitchen esomeprazole (NEXIUM) 40 MG capsule Take 40 mg by mouth daily before breakfast.  . famotidine (PEPCID) 40 MG tablet Take 40 mg by mouth daily.  . Fluticasone Furoate-Vilanterol (BREO ELLIPTA) 100-25 MCG/INH AEPB Inhale 1 puff into the lungs daily.  . furosemide (LASIX) 20 MG tablet Take 1 tablet (20 mg total) by mouth daily. As needed for fluid retention  . hydrocortisone (ANUSOL-HC) 25 MG suppository Place 25 mg rectally. Prn   . meloxicam (MOBIC) 15 MG tablet Take 1 tablet (15 mg total) by mouth daily.  . montelukast (SINGULAIR) 10 MG tablet Take 1 tablet (10 mg total) by mouth at bedtime.  . Multiple Vitamins-Minerals (CENTRUM SILVER PO) Take by mouth. Take 1 am   . olopatadine (PATANOL) 0.1 % ophthalmic solution Place 1 drop into both eyes as needed.   [name] Glycol-Propyl Glycol (SYSTANE OP) Apply to eye. 1-2 drops in each eye as needed.  . traMADol (ULTRAM) 50 MG tablet Take 1 tablet (50 mg total) by mouth every 6 (six) hours as needed.  . vitamin C (ASCORBIC ACID) 500 MG tablet Take 500  mg by mouth daily.  . vitamin E (VITAMIN E) 400 UNIT capsule Take 400 Units by mouth daily.  . Wheat Dextrin (BENEFIBER DRINK MIX PO) Take by mouth. 2 teaspoons mixed into drink daily  . zolpidem (AMBIEN) 10 MG tablet Take 1 tablet (10 mg total) by mouth at bedtime as needed.  . [DISCONTINUED] Ergocalciferol (VITAMIN D2 PO) Take 1,000 Units by mouth daily.   . [DISCONTINUED] traMADol (ULTRAM) 50 MG tablet take 1 tablet by mouth every 8 hours if needed for pain  . [DISCONTINUED] desloratadine (CLARINEX) 5 MG tablet Take 1 tablet (5 mg total) by mouth daily.  . [DISCONTINUED] Omega-3 Fatty Acids (FISH OIL TRIPLE STRENGTH) 1400 MG CAPS Take 1,400 mg by mouth daily.

## 2013-11-22 NOTE — Patient Instructions (Signed)
You had your annual Medicare wellness exam today  We will schedule your mammogram soon.   We will contact you with the bloodwork results

## 2013-11-22 NOTE — Progress Notes (Signed)
Pre-visit discussion using our clinic review tool. No additional management support is needed unless otherwise documented below in the visit note.  

## 2013-11-23 ENCOUNTER — Other Ambulatory Visit (INDEPENDENT_AMBULATORY_CARE_PROVIDER_SITE_OTHER): Payer: Medicare Other

## 2013-11-23 DIAGNOSIS — R5383 Other fatigue: Secondary | ICD-10-CM | POA: Diagnosis not present

## 2013-11-23 DIAGNOSIS — E119 Type 2 diabetes mellitus without complications: Secondary | ICD-10-CM | POA: Diagnosis not present

## 2013-11-23 DIAGNOSIS — R5381 Other malaise: Secondary | ICD-10-CM | POA: Diagnosis not present

## 2013-11-23 LAB — CBC WITH DIFFERENTIAL/PLATELET
BASOS ABS: 0 10*3/uL (ref 0.0–0.1)
Basophils Relative: 0.5 % (ref 0.0–3.0)
Eosinophils Absolute: 0.6 10*3/uL (ref 0.0–0.7)
Eosinophils Relative: 6.9 % — ABNORMAL HIGH (ref 0.0–5.0)
HCT: 36.8 % (ref 36.0–46.0)
Hemoglobin: 12.4 g/dL (ref 12.0–15.0)
Lymphocytes Relative: 45.8 % (ref 12.0–46.0)
Lymphs Abs: 3.8 10*3/uL (ref 0.7–4.0)
MCHC: 33.8 g/dL (ref 30.0–36.0)
MCV: 88.9 fl (ref 78.0–100.0)
MONOS PCT: 8.7 % (ref 3.0–12.0)
Monocytes Absolute: 0.7 10*3/uL (ref 0.1–1.0)
Neutro Abs: 3.1 10*3/uL (ref 1.4–7.7)
Neutrophils Relative %: 38.1 % — ABNORMAL LOW (ref 43.0–77.0)
PLATELETS: 252 10*3/uL (ref 150.0–400.0)
RBC: 4.13 Mil/uL (ref 3.87–5.11)
RDW: 13.8 % (ref 11.5–15.5)
WBC: 8.2 10*3/uL (ref 4.0–10.5)

## 2013-11-23 LAB — COMPREHENSIVE METABOLIC PANEL
ALBUMIN: 3.8 g/dL (ref 3.5–5.2)
ALK PHOS: 73 U/L (ref 39–117)
ALT: 59 U/L — ABNORMAL HIGH (ref 0–35)
AST: 45 U/L — AB (ref 0–37)
BUN: 19 mg/dL (ref 6–23)
CO2: 27 mEq/L (ref 19–32)
Calcium: 9.4 mg/dL (ref 8.4–10.5)
Chloride: 104 mEq/L (ref 96–112)
Creatinine, Ser: 0.9 mg/dL (ref 0.4–1.2)
GFR: 66.18 mL/min (ref >60.00)
Glucose, Bld: 102 mg/dL — ABNORMAL HIGH (ref 70–99)
POTASSIUM: 4.2 meq/L (ref 3.5–5.1)
Sodium: 139 mEq/L (ref 135–145)
TOTAL PROTEIN: 6.9 g/dL (ref 6.0–8.3)
Total Bilirubin: 0.6 mg/dL (ref 0.2–1.2)

## 2013-11-23 LAB — LIPID PANEL
CHOL/HDL RATIO: 5
Cholesterol: 232 mg/dL — ABNORMAL HIGH (ref 0–200)
HDL: 42.3 mg/dL (ref >39.00)
LDL CALC: 156 mg/dL — AB (ref 0–99)
NonHDL: 189.7
Triglycerides: 167 mg/dL — ABNORMAL HIGH (ref 0.0–149.0)
VLDL: 33.4 mg/dL (ref 0.0–40.0)

## 2013-11-23 LAB — TSH: TSH: 3.17 u[IU]/mL (ref 0.35–4.50)

## 2013-11-23 LAB — HEMOGLOBIN A1C: Hgb A1c MFr Bld: 6 % (ref 4.6–6.5)

## 2013-11-24 DIAGNOSIS — Z5189 Encounter for other specified aftercare: Secondary | ICD-10-CM | POA: Diagnosis not present

## 2013-11-24 DIAGNOSIS — J449 Chronic obstructive pulmonary disease, unspecified: Secondary | ICD-10-CM | POA: Diagnosis not present

## 2013-11-25 ENCOUNTER — Encounter: Payer: Self-pay | Admitting: Internal Medicine

## 2013-11-25 NOTE — Assessment & Plan Note (Signed)
Diagnosed by Dr. Candace Cruise.   Vaccinations for Hep A and B completed. I have recommended a low glycemic index diet utilizing smaller more frequent meals to increase metabolism.  I have also recommended that patient start exercising with a goal of 30 minutes of aerobic exercise a minimum of 5 days per week.     Lab Results  Component Value Date   ALT 59* 11/23/2013   AST 45* 11/23/2013   ALKPHOS 73 11/23/2013   BILITOT 0.6 11/23/2013

## 2013-11-25 NOTE — Assessment & Plan Note (Signed)
Diagnosed with fasting glucose of 140 in May 2014. Well-controlled on diet alone . Patient is up-to-date on eye exams and foot exam was done today.  There is  no proteinuria on today's micro urinalysis .  Fasting lipids are elevated; statin therapy has been prescribed in the past but not tolerated arthralgias.   Lab Results  Component Value Date   HGBA1C 6.0 11/23/2013   Lab Results  Component Value Date   MICROALBUR 0.4 11/21/2012   Lab Results  Component Value Date   CHOL 232* 11/23/2013   HDL 42.30 11/23/2013   LDLCALC 156* 11/23/2013   LDLDIRECT 200.2 05/19/2013   TRIG 167.0* 11/23/2013   CHOLHDL 5 11/23/2013

## 2013-11-25 NOTE — Assessment & Plan Note (Signed)
Annual comprehensive exam was done including breast, excluding pelvic and PAP smear. All screenings have been addressed .  

## 2013-11-27 DIAGNOSIS — Z5189 Encounter for other specified aftercare: Secondary | ICD-10-CM | POA: Diagnosis not present

## 2013-11-27 DIAGNOSIS — J449 Chronic obstructive pulmonary disease, unspecified: Secondary | ICD-10-CM | POA: Diagnosis not present

## 2013-11-29 DIAGNOSIS — Z5189 Encounter for other specified aftercare: Secondary | ICD-10-CM | POA: Diagnosis not present

## 2013-11-29 DIAGNOSIS — J449 Chronic obstructive pulmonary disease, unspecified: Secondary | ICD-10-CM | POA: Diagnosis not present

## 2013-12-01 DIAGNOSIS — J449 Chronic obstructive pulmonary disease, unspecified: Secondary | ICD-10-CM | POA: Diagnosis not present

## 2013-12-01 DIAGNOSIS — Z5189 Encounter for other specified aftercare: Secondary | ICD-10-CM | POA: Diagnosis not present

## 2013-12-01 LAB — HM MAMMOGRAPHY: HM Mammogram: NORMAL

## 2013-12-04 DIAGNOSIS — J449 Chronic obstructive pulmonary disease, unspecified: Secondary | ICD-10-CM | POA: Diagnosis not present

## 2013-12-04 DIAGNOSIS — Z5189 Encounter for other specified aftercare: Secondary | ICD-10-CM | POA: Diagnosis not present

## 2013-12-06 DIAGNOSIS — J449 Chronic obstructive pulmonary disease, unspecified: Secondary | ICD-10-CM | POA: Diagnosis not present

## 2013-12-06 DIAGNOSIS — Z5189 Encounter for other specified aftercare: Secondary | ICD-10-CM | POA: Diagnosis not present

## 2013-12-08 DIAGNOSIS — D239 Other benign neoplasm of skin, unspecified: Secondary | ICD-10-CM | POA: Diagnosis not present

## 2013-12-08 DIAGNOSIS — N63 Unspecified lump in unspecified breast: Secondary | ICD-10-CM | POA: Diagnosis not present

## 2013-12-12 ENCOUNTER — Encounter: Payer: Self-pay | Admitting: Pulmonary Disease

## 2013-12-14 ENCOUNTER — Ambulatory Visit (INDEPENDENT_AMBULATORY_CARE_PROVIDER_SITE_OTHER): Payer: Medicare Other | Admitting: Pulmonary Disease

## 2013-12-14 ENCOUNTER — Encounter: Payer: Self-pay | Admitting: Pulmonary Disease

## 2013-12-14 VITALS — BP 126/74 | HR 89 | Ht 62.0 in | Wt 181.0 lb

## 2013-12-14 DIAGNOSIS — R0989 Other specified symptoms and signs involving the circulatory and respiratory systems: Secondary | ICD-10-CM | POA: Diagnosis not present

## 2013-12-14 DIAGNOSIS — J449 Chronic obstructive pulmonary disease, unspecified: Secondary | ICD-10-CM

## 2013-12-14 DIAGNOSIS — Z0000 Encounter for general adult medical examination without abnormal findings: Secondary | ICD-10-CM

## 2013-12-14 DIAGNOSIS — R0609 Other forms of dyspnea: Secondary | ICD-10-CM | POA: Diagnosis not present

## 2013-12-14 DIAGNOSIS — J438 Other emphysema: Secondary | ICD-10-CM

## 2013-12-14 DIAGNOSIS — J301 Allergic rhinitis due to pollen: Secondary | ICD-10-CM | POA: Diagnosis not present

## 2013-12-14 DIAGNOSIS — R0600 Dyspnea, unspecified: Secondary | ICD-10-CM

## 2013-12-14 DIAGNOSIS — J4489 Other specified chronic obstructive pulmonary disease: Secondary | ICD-10-CM

## 2013-12-14 NOTE — Assessment & Plan Note (Signed)
Given her prior heavy tobacco use we will order a lung cancer screening CT.

## 2013-12-14 NOTE — Patient Instructions (Addendum)
We will set up a screening CT scan of your lungs at [facility] We will arrange a pulmonary function test at [facility] Use Dymista 2 puffs each nostril twice daily  We will see you back in 4 weeks or sooner if needed

## 2013-12-14 NOTE — Progress Notes (Signed)
Subjective:    Patient ID: Traci Hall, female    DOB: [birth year], 68 y.o.   MRN: [id number]  Synopsis: Traci Hall first saw [facility] in 08/2011 for shortness of breath.  She was diagnosed with Grade A COPD.  She previously smoked 1.5 ppd for 45 years.  She has allergic rhinitis as well. She completed pulmonary rehabilitation at [facility] in 2015.  HPI  12/14/2013> Traci Hall has been having more trouble with shortness of breath since the last visit. She tells me that ever since the pollen season she started having more allergy symptoms specifically sneezing. She said that this was associated with increasing shortness of breath. At this point she does not have much in the way of sinus congestion or scratchy throat but she continues to struggle with shortness of breath. However, she recently completed pulmonary rehabilitation and during that time she actually increased her total distance walked in 6 minutes and the speed at which she walked on the treadmill. She was also found to have normal oxygenation during that time. She tells me that she does not have chest tightness or chest pain or shortness of breath. Her cough has not changed much lately. She has not been wheezing. She has been taking the Breo regularly and initially she said that it helped but now she is wondering if it's ineffective.   Past Medical History  Diagnosis Date  . Asthma   . Allergic rhinitis   . OSA on CPAP   . COPD (chronic obstructive pulmonary disease)     by prior PFTs  . Hyperlipidemia       Review of Systems  Constitutional: Negative for fever, chills and unexpected weight change.  HENT: Positive for sneezing. Negative for ear pain, nosebleeds, postnasal drip and rhinorrhea.   Respiratory: Positive for shortness of breath. Negative for cough, choking and chest tightness.   Cardiovascular: Negative for chest pain and leg swelling.       Objective:   Physical Exam   Filed Vitals:   12/14/13 1454  BP:  126/74  Pulse: 89  Height: 5\' 2"  (1.575 m)  Weight: 181 lb (82.101 kg)  SpO2: 100%  RA  Gen: well appearing, no acute distress HEENT: NCAT, EOMi, OP clear PULM: CTA B, few crackles in bases bilaterally CV: RRR, no mgr, no JVD AB: BS+, soft, nontender, no hsm Ext: warm, no edema, no clubbing, no cyanosis  08/2011 Simple spirometry: F/F ratio 67% FEV1 1.74L (79% pred)     Assessment & Plan:   COPD with asthma She has been having more shortness of breath lately. She attributed this to her allergies though she has not had more allergic rhinitis-type symptoms lately.  I explained to her that COPD certainly worsens and that this could be possible for her. However, it would be worthwhile to repeat lung function testing and chest imaging to see if there is another abnormality.  Plan: -Full pulmonary function test -If she's not approved for lung cancer screening then we will get a chest x-ray (she is to call and let us know if this does not go through) -Consider addition of Spiriva or changing back to take twice a day combination long acting beta agonist and steroid inhaler next visit (she's been on that in the past) -Followup 4 weeks  Rhinitis, allergic Perhaps this is contributing to her symptoms of dyspnea somewhat. I have given her samples of Dymista which she will try for a couple weeks and let us know if it helps.  Dyspnea See above. If no clear worsening on pulmonary function testing and abnormality seen on chest imaging then she should see cardiology or at least undergo a stress test and echocardiogram.  Routine health maintenance Given her prior heavy tobacco use we will order a lung cancer screening CT.    Updated Medication List Outpatient Encounter Prescriptions as of 12/14/2013  Medication Sig  . albuterol (VENTOLIN HFA) 108 (90 BASE) MCG/ACT inhaler Inhale 2 puffs into the lungs every 6 (six) hours as needed.  Marland Kitchen aspirin 81 MG tablet Take 81 mg by mouth daily.  .  Bimatoprost (LUMIGAN OP) Apply to eye. 1 drop in each eye p.m.  Marland Kitchen Cholecalciferol (VITAMIN D-3) 1000 UNITS CAPS Take 1 capsule by mouth daily.  Marland Kitchen desloratadine (CLARINEX) 5 MG tablet Take 1 tablet (5 mg total) by mouth daily.  Marland Kitchen esomeprazole (NEXIUM) 40 MG capsule Take 40 mg by mouth daily before breakfast.  . famotidine (PEPCID) 40 MG tablet Take 40 mg by mouth daily.  . Fluticasone Furoate-Vilanterol (BREO ELLIPTA) 100-25 MCG/INH AEPB Inhale 1 puff into the lungs daily.  . furosemide (LASIX) 20 MG tablet Take 1 tablet (20 mg total) by mouth daily. As needed for fluid retention  . hydrocortisone (ANUSOL-HC) 25 MG suppository Place 25 mg rectally. Prn   . montelukast (SINGULAIR) 10 MG tablet Take 1 tablet (10 mg total) by mouth at bedtime.  . Multiple Vitamins-Minerals (CENTRUM SILVER PO) Take by mouth. Take 1 am   . olopatadine (PATANOL) 0.1 % ophthalmic solution Place 1 drop into both eyes as needed.   [name] Glycol-Propyl Glycol (SYSTANE OP) Apply to eye. 1-2 drops in each eye as needed.  . vitamin C (ASCORBIC ACID) 500 MG tablet Take 500 mg by mouth daily.  . vitamin E (VITAMIN E) 400 UNIT capsule Take 400 Units by mouth daily.  . Wheat Dextrin (BENEFIBER DRINK MIX PO) Take by mouth. 2 teaspoons mixed into drink daily  . zolpidem (AMBIEN) 10 MG tablet Take 1 tablet (10 mg total) by mouth at bedtime as needed.  . meloxicam (MOBIC) 15 MG tablet Take 1 tablet (15 mg total) by mouth daily.  . traMADol (ULTRAM) 50 MG tablet Take 1 tablet (50 mg total) by mouth every 6 (six) hours as needed.

## 2013-12-14 NOTE — Assessment & Plan Note (Signed)
Perhaps this is contributing to her symptoms of dyspnea somewhat. I have given her samples of Dymista which she will try for a couple weeks and let us know if it helps.

## 2013-12-14 NOTE — Assessment & Plan Note (Addendum)
She has been having more shortness of breath lately. She attributed this to her allergies though she has not had more allergic rhinitis-type symptoms lately.  I explained to her that COPD certainly worsens and that this could be possible for her. However, it would be worthwhile to repeat lung function testing and chest imaging to see if there is another abnormality.  Plan: -Full pulmonary function test -If she's not approved for lung cancer screening then we will get a chest x-ray (she is to call and let us know if this does not go through) -Consider addition of Spiriva or changing back to take twice a day combination long acting beta agonist and steroid inhaler next visit (she's been on that in the past) -Followup 4 weeks

## 2013-12-14 NOTE — Assessment & Plan Note (Addendum)
See above. If no clear worsening on pulmonary function testing and abnormality seen on chest imaging then she should see cardiology or at least undergo a stress test and echocardiogram.

## 2013-12-21 ENCOUNTER — Telehealth: Payer: Self-pay | Admitting: Pulmonary Disease

## 2013-12-21 ENCOUNTER — Ambulatory Visit (INDEPENDENT_AMBULATORY_CARE_PROVIDER_SITE_OTHER): Payer: Medicare Other | Admitting: Adult Health

## 2013-12-21 ENCOUNTER — Encounter: Payer: Self-pay | Admitting: Adult Health

## 2013-12-21 ENCOUNTER — Ambulatory Visit: Payer: Self-pay | Admitting: Pulmonary Disease

## 2013-12-21 VITALS — BP 151/81 | HR 85 | Temp 98.2°F | Resp 14 | Wt 180.5 lb

## 2013-12-21 DIAGNOSIS — H9209 Otalgia, unspecified ear: Secondary | ICD-10-CM

## 2013-12-21 DIAGNOSIS — J449 Chronic obstructive pulmonary disease, unspecified: Secondary | ICD-10-CM | POA: Diagnosis not present

## 2013-12-21 DIAGNOSIS — R0602 Shortness of breath: Secondary | ICD-10-CM | POA: Diagnosis not present

## 2013-12-21 DIAGNOSIS — H9201 Otalgia, right ear: Secondary | ICD-10-CM

## 2013-12-21 LAB — PULMONARY FUNCTION TEST

## 2013-12-21 NOTE — Patient Instructions (Signed)
  You do not have an ear infection. Avoid using cotton swabs in your ears. Keep them dry especially after a shower.  Dymista may cause headache. Try using 1 spray in each nostril daily and gradually work up to twice a day.  Change your antihistamine. It is possible to develop a tolerance to medication. Switch to any of the over the counter, once a day antihistamines such as allegra, zyrtec.  Take a mild decongestant such as Dimetapp Children.   You might also try irrigating your sinuses with saline nasal spray. You can squirt a couple of sprays into each nostril several times throughout the day.  Take tylenol for your headache. Do not exceed 3000 mg in a 24 hour period.  If no improvement in your symptoms within 4-5 days please call the office.

## 2013-12-21 NOTE — Telephone Encounter (Signed)
ATC pt line rang several times [facility]

## 2013-12-21 NOTE — Progress Notes (Signed)
Pre visit review using our clinic review tool, if applicable. No additional management support is needed unless otherwise documented below in the visit note. 

## 2013-12-21 NOTE — Progress Notes (Signed)
Patient ID: Traci Hall, female   DOB: [birth year], 68 y.o.   MRN: [id number]    Subjective:    Patient ID: Traci Hall, female    DOB: 1946-04-10, 68 y.o.   MRN: [id number]  HPI  Pt is a pleasant 68 y/o female who presents with right ear pain. Some pressure of the right ethmoid sinus. No drainage. Reports slight post nasal drip. No fever but has had chills. Does not feel bad. Has had HA since starting dymista.   Past Medical History  Diagnosis Date  . Asthma   . Allergic rhinitis   . OSA on CPAP   . COPD (chronic obstructive pulmonary disease)     by prior PFTs  . Hyperlipidemia     Current Outpatient Prescriptions on File Prior to Visit  Medication Sig Dispense Refill  . albuterol (VENTOLIN HFA) 108 (90 BASE) MCG/ACT inhaler Inhale 2 puffs into the lungs every 6 (six) hours as needed.  3 Inhaler  3  . aspirin 81 MG tablet Take 81 mg by mouth daily.      . Bimatoprost (LUMIGAN OP) Apply to eye. 1 drop in each eye p.m.      Marland Kitchen Cholecalciferol (VITAMIN D-3) 1000 UNITS CAPS Take 1 capsule by mouth daily.      Marland Kitchen desloratadine (CLARINEX) 5 MG tablet Take 1 tablet (5 mg total) by mouth daily.  90 tablet  3  . esomeprazole (NEXIUM) 40 MG capsule Take 40 mg by mouth daily before breakfast.      . famotidine (PEPCID) 40 MG tablet Take 40 mg by mouth daily.      . Fluticasone Furoate-Vilanterol (BREO ELLIPTA) 100-25 MCG/INH AEPB Inhale 1 puff into the lungs daily.  90 each  3  . furosemide (LASIX) 20 MG tablet Take 1 tablet (20 mg total) by mouth daily. As needed for fluid retention  30 tablet  3  . hydrocortisone (ANUSOL-HC) 25 MG suppository Place 25 mg rectally. Prn       . meloxicam (MOBIC) 15 MG tablet Take 1 tablet (15 mg total) by mouth daily.  90 tablet  1  . montelukast (SINGULAIR) 10 MG tablet Take 1 tablet (10 mg total) by mouth at bedtime.  90 tablet  3  . Multiple Vitamins-Minerals (CENTRUM SILVER PO) Take by mouth. Take 1 am       . olopatadine (PATANOL) 0.1 %  ophthalmic solution Place 1 drop into both eyes as needed.       [name] Glycol-Propyl Glycol (SYSTANE OP) Apply to eye. 1-2 drops in each eye as needed.      . traMADol (ULTRAM) 50 MG tablet Take 1 tablet (50 mg total) by mouth every 6 (six) hours as needed.  270 tablet  2  . vitamin C (ASCORBIC ACID) 500 MG tablet Take 500 mg by mouth daily.      . vitamin E (VITAMIN E) 400 UNIT capsule Take 400 Units by mouth daily.      . Wheat Dextrin (BENEFIBER DRINK MIX PO) Take by mouth. 2 teaspoons mixed into drink daily      . zolpidem (AMBIEN) 10 MG tablet Take 1 tablet (10 mg total) by mouth at bedtime as needed.  30 tablet  3   No current facility-administered medications on file prior to visit.     Review of Systems  Constitutional: Positive for chills. Negative for fever.  HENT: Positive for ear pain and sinus pressure (ethmoid sinus right). Negative for congestion.   Neurological:  Positive for headaches (dull HA since starting dymista).  All other systems reviewed and are negative.      Objective:  BP 151/81  Pulse 85  Temp(Src) 98.2 F (36.8 C) (Oral)  Resp 14  Wt 180 lb 8 oz (81.874 kg)  SpO2 96%   Physical Exam  Constitutional: She is oriented to person, place, and time. She appears well-developed and well-nourished. No distress.  HENT:  Head: Normocephalic and atraumatic.  Right Ear: External ear normal.  Mouth/Throat: Oropharynx is clear and moist. No oropharyngeal exudate.  Left ear with cerumen build up  Cardiovascular: Normal rate, regular rhythm and normal heart sounds.  Exam reveals no gallop.   No murmur heard. Pulmonary/Chest: Effort normal and breath sounds normal. No respiratory distress. She has no wheezes. She has no rales.  Lymphadenopathy:    She has no cervical adenopathy.  Neurological: She is alert and oriented to person, place, and time.  Psychiatric: She has a normal mood and affect. Her behavior is normal. Judgment and thought content normal.       Assessment & Plan:   1. Right ear pain No otitis media or externa. Suspect may be pressure from sinuses as she has some tenderness of the right ethmoid and maxillary sinus on the right. Recommend changing her antihistamine 2/2 tolerance. She has been taking the same antihistamine for many years. Also recommend short course of dimetapp children's elixir as a decongestant. Irrigate sinuses with saline. Dymista has SE of headache and she has a dull HA since starting. Recommend that she work up to twice a day dose. For now she can start 1 spray into each nostril daily.

## 2013-12-22 ENCOUNTER — Ambulatory Visit: Payer: Self-pay | Admitting: Family Medicine

## 2013-12-22 DIAGNOSIS — I2510 Atherosclerotic heart disease of native coronary artery without angina pectoris: Secondary | ICD-10-CM | POA: Diagnosis not present

## 2013-12-22 DIAGNOSIS — Z122 Encounter for screening for malignant neoplasm of respiratory organs: Secondary | ICD-10-CM | POA: Diagnosis not present

## 2013-12-22 DIAGNOSIS — Z87891 Personal history of nicotine dependence: Secondary | ICD-10-CM | POA: Diagnosis not present

## 2013-12-22 DIAGNOSIS — J438 Other emphysema: Secondary | ICD-10-CM | POA: Diagnosis not present

## 2013-12-22 DIAGNOSIS — J449 Chronic obstructive pulmonary disease, unspecified: Secondary | ICD-10-CM | POA: Diagnosis not present

## 2013-12-22 NOTE — Telephone Encounter (Signed)
Spoke with patient-she is aware to call the [facility] [address] office on Monday and Tuesday. Nothing more needed at this time.

## 2013-12-22 NOTE — Telephone Encounter (Signed)
lmomtcb x 1  For the pt.

## 2013-12-22 NOTE — Telephone Encounter (Signed)
Pt returned call & will be home until 3:30 PM.  Traci Hall

## 2013-12-22 NOTE — Telephone Encounter (Signed)
Called spoke with pt. She saw Dr. Lake Bells 12/14/13 and told to f/u in 4 weeks. Dr. Lake Bells is not in [place] around that time. She scheduled appt 02/08/14. She did not want to come to [place] since she does not know this area. Will forward to Dr. Lake Bells as an Juluis Rainier

## 2013-12-22 NOTE — Telephone Encounter (Signed)
Ask her to call [place] office on Monday and Tuesday If someone cancels we can try to work her in

## 2013-12-22 NOTE — Telephone Encounter (Signed)
lmtcb x1 

## 2013-12-25 ENCOUNTER — Encounter: Payer: Self-pay | Admitting: Pulmonary Disease

## 2013-12-25 ENCOUNTER — Encounter: Payer: Self-pay | Admitting: Internal Medicine

## 2013-12-26 ENCOUNTER — Telehealth: Payer: Self-pay

## 2013-12-26 NOTE — Telephone Encounter (Signed)
Traci Hall to let pt know of PFT results.

## 2013-12-26 NOTE — Telephone Encounter (Signed)
Message copied by Len Blalock on Tue Dec 26, 2013 12:43 PM ------      Message from: Simonne Maffucci B      Created: Mon Dec 25, 2013  3:01 PM       A,            Please let her know that her PFT is actually better than it was a few years ago.            Thanks      B ------

## 2013-12-28 NOTE — Telephone Encounter (Signed)
I spoke with patient about results and she verbalized understanding and had no questions 

## 2014-01-03 ENCOUNTER — Other Ambulatory Visit: Payer: Self-pay

## 2014-01-03 MED ORDER — MONTELUKAST SODIUM 10 MG PO TABS: 10.0000 mg | ORAL_TABLET | Freq: Every day | ORAL | Status: DC

## 2014-01-05 ENCOUNTER — Encounter: Payer: Self-pay | Admitting: *Deleted

## 2014-01-05 NOTE — Progress Notes (Signed)
Char reviewed for DM bundle.  Last Ov and labs 11/22/13. Appt sch 03/07/14

## 2014-01-16 ENCOUNTER — Encounter: Payer: Self-pay | Admitting: Pulmonary Disease

## 2014-02-02 ENCOUNTER — Encounter: Payer: Self-pay | Admitting: Internal Medicine

## 2014-02-02 MED ORDER — HYDROCORTISONE ACETATE 25 MG RE SUPP: 25.0000 mg | Freq: Two times a day (BID) | RECTAL | Status: DC

## 2014-02-02 NOTE — Telephone Encounter (Signed)
See mychart, ok refill? 

## 2014-02-08 ENCOUNTER — Encounter: Payer: Self-pay | Admitting: Pulmonary Disease

## 2014-02-08 ENCOUNTER — Ambulatory Visit (INDEPENDENT_AMBULATORY_CARE_PROVIDER_SITE_OTHER): Payer: Medicare Other | Admitting: Pulmonary Disease

## 2014-02-08 VITALS — BP 134/76 | HR 74 | Ht 62.0 in | Wt 182.0 lb

## 2014-02-08 DIAGNOSIS — Z8719 Personal history of other diseases of the digestive system: Secondary | ICD-10-CM | POA: Diagnosis not present

## 2014-02-08 DIAGNOSIS — J449 Chronic obstructive pulmonary disease, unspecified: Secondary | ICD-10-CM | POA: Diagnosis not present

## 2014-02-08 DIAGNOSIS — K648 Other hemorrhoids: Secondary | ICD-10-CM | POA: Diagnosis not present

## 2014-02-08 DIAGNOSIS — J4489 Other specified chronic obstructive pulmonary disease: Secondary | ICD-10-CM

## 2014-02-08 DIAGNOSIS — K7689 Other specified diseases of liver: Secondary | ICD-10-CM | POA: Diagnosis not present

## 2014-02-08 MED ORDER — ALBUTEROL SULFATE HFA 108 (90 BASE) MCG/ACT IN AERS: 2.0000 | INHALATION_SPRAY | Freq: Every day | RESPIRATORY_TRACT | Status: DC

## 2014-02-08 NOTE — Assessment & Plan Note (Signed)
This has been a stable interval for Traci Hall. She seems to be doing well on the [place]. Her shortness of breath has improved. Her repeat pulmonary function test was actually better than the one from several years ago.  She has mild COPD with asthmatic features.  Plan: -Continue Breo -Flu shot in the fall -Followup 6 months

## 2014-02-08 NOTE — Progress Notes (Signed)
Subjective:    Patient ID: Traci Hall, female    DOB: 1945/08/08, 68 y.o.   MRN: [id number]  Synopsis: Traci Hall first saw [facility] in 08/2011 for shortness of breath.  She was diagnosed with Grade A COPD.  She previously smoked 1.5 ppd for 45 years.  She has allergic rhinitis as well. She completed pulmonary rehabilitation at [name] in 2015.  With asthmatic component, FEV1 8% response to bronchodilator 2010 08/2011 spirometry F/F ratio 67% FEV1 1.74L (79% pred) 08/2011 mMRC 1 05/2012 CAT 7 07/2013 6MW> 1460 feet; O2 sat 97% RA 07/2013 spirometry  Ratio 84%, FEV1 1.92L (91% pred) 11/2013 6MW > 1625 feet  O2 94% RA 12/2013 PFT> Ratio 69%, FEV1 2.07L (110% pred, 0% change), TLC 3.70L (84% pred), DLCO (65% pred)  HPI  02/08/2014 ROV > Traci Hall feels liek she is doing better with the Breo and the nose spray we gave her.  The dymista gave her headaches on the full dose, so she reduced it to half dose and she is doing better.  She is fully functional without dyspnea.  She has a herniated disk which has been bothersome to her.  She has not been able to exercise due to this. She has seen a neurosurgeon, but is treating it conservatively right now.  She is only using albuterol once daily which seems to help.  She is only using it as a matter of course and no as needed.  Past Medical History  Diagnosis Date  . Asthma   . Allergic rhinitis   . OSA on CPAP   . COPD (chronic obstructive pulmonary disease)     by prior PFTs  . Hyperlipidemia       Review of Systems  Constitutional: Negative for fever, chills and unexpected weight change.  HENT: Negative for ear pain, nosebleeds, postnasal drip, rhinorrhea and sneezing.   Respiratory: Negative for cough, choking, chest tightness and shortness of breath.   Cardiovascular: Negative for chest pain and leg swelling.       Objective:   Physical Exam   Filed Vitals:   02/08/14 1446  BP: 134/76  Pulse: 74  Height: 5\' 2"  (1.575 m)  Weight:  182 lb (82.555 kg)  SpO2: 98%  RA  Gen: well appearing, no acute distress HEENT: NCAT, EOMi, OP clear PULM: CTA B, few crackles in bases bilaterally CV: RRR, no mgr, no JVD AB: BS+, soft, nontender, no hsm Ext: warm, no edema, no clubbing, no cyanosis  08/2011 spirometry F/F ratio 67% FEV1 1.74L (79% pred) 08/2011 mMRC 1 05/2012 CAT 7 07/2013 6MW> 1460 feet; O2 sat 97% RA 07/2013 spirometry  Ratio 84%, FEV1 1.92L (91% pred) 11/2013 6MW > 1625 feet  O2 94% RA 12/2013 PFT> Ratio 69%, FEV1 2.07L (110% pred, 0% change), TLC 3.70L (84% pred), DLCO (65% pred)    Assessment & Plan:   COPD with asthma This has been a stable interval for Traci Hall. She seems to be doing well on the [place]. Her shortness of breath has improved. Her repeat pulmonary function test was actually better than the one from several years ago.  She has mild COPD with asthmatic features.  Plan: -Continue Breo -Flu shot in the fall -Followup 6 months    Updated Medication List Outpatient Encounter Prescriptions as of 02/08/2014  Medication Sig  . albuterol (PROVENTIL HFA;VENTOLIN HFA) 108 (90 BASE) MCG/ACT inhaler Inhale 2 puffs into the lungs every 6 (six) hours as needed. Pt uses 2 puff qam and prn throughout day.  Marland Kitchen  aspirin 81 MG tablet Take 81 mg by mouth daily.  . Bimatoprost (LUMIGAN OP) Apply to eye. 1 drop in each eye p.m.  Marland Kitchen Cholecalciferol (VITAMIN D-3) 1000 UNITS CAPS Take 1 capsule by mouth daily.  Marland Kitchen desloratadine (CLARINEX) 5 MG tablet Take 1 tablet (5 mg total) by mouth daily.  Marland Kitchen esomeprazole (NEXIUM) 40 MG capsule Take 40 mg by mouth daily before breakfast.  . famotidine (PEPCID) 40 MG tablet Take 40 mg by mouth daily.  . Fluticasone Furoate-Vilanterol (BREO ELLIPTA) 100-25 MCG/INH AEPB Inhale 1 puff into the lungs daily.  . furosemide (LASIX) 20 MG tablet Take 1 tablet (20 mg total) by mouth daily. As needed for fluid retention  . hydrocortisone (ANUSOL-HC) 25 MG suppository Place 1 suppository (25 mg  total) rectally 2 (two) times daily.  . meloxicam (MOBIC) 15 MG tablet Take 1 tablet (15 mg total) by mouth daily.  . montelukast (SINGULAIR) 10 MG tablet Take 1 tablet (10 mg total) by mouth at bedtime.  . Multiple Vitamins-Minerals (CENTRUM SILVER PO) Take by mouth. Take 1 am   . olopatadine (PATANOL) 0.1 % ophthalmic solution Place 1 drop into both eyes as needed.   [name] Glycol-Propyl Glycol (SYSTANE OP) Apply to eye. 1-2 drops in each eye as needed.  . traMADol (ULTRAM) 50 MG tablet Take 1 tablet (50 mg total) by mouth every 6 (six) hours as needed.  . vitamin C (ASCORBIC ACID) 500 MG tablet Take 500 mg by mouth daily.  . vitamin E (VITAMIN E) 400 UNIT capsule Take 400 Units by mouth daily.  . Wheat Dextrin (BENEFIBER DRINK MIX PO) Take by mouth. 2 teaspoons mixed into drink daily  . zolpidem (AMBIEN) 10 MG tablet Take 1 tablet (10 mg total) by mouth at bedtime as needed.  . [DISCONTINUED] albuterol (VENTOLIN HFA) 108 (90 BASE) MCG/ACT inhaler Inhale 2 puffs into the lungs every 6 (six) hours as needed.

## 2014-02-08 NOTE — Patient Instructions (Signed)
Keep taking the [facility] daily as you are doing Get a flu shot in the fall We will see you back in 6 months or sooner if needed

## 2014-02-12 DIAGNOSIS — K648 Other hemorrhoids: Secondary | ICD-10-CM | POA: Diagnosis not present

## 2014-02-26 DIAGNOSIS — D239 Other benign neoplasm of skin, unspecified: Secondary | ICD-10-CM | POA: Diagnosis not present

## 2014-02-26 DIAGNOSIS — L819 Disorder of pigmentation, unspecified: Secondary | ICD-10-CM | POA: Diagnosis not present

## 2014-02-26 DIAGNOSIS — L719 Rosacea, unspecified: Secondary | ICD-10-CM | POA: Diagnosis not present

## 2014-02-26 DIAGNOSIS — L723 Sebaceous cyst: Secondary | ICD-10-CM | POA: Diagnosis not present

## 2014-02-26 DIAGNOSIS — L919 Hypertrophic disorder of the skin, unspecified: Secondary | ICD-10-CM | POA: Diagnosis not present

## 2014-02-26 DIAGNOSIS — D1800 Hemangioma unspecified site: Secondary | ICD-10-CM | POA: Diagnosis not present

## 2014-02-26 DIAGNOSIS — L909 Atrophic disorder of skin, unspecified: Secondary | ICD-10-CM | POA: Diagnosis not present

## 2014-02-26 DIAGNOSIS — L821 Other seborrheic keratosis: Secondary | ICD-10-CM | POA: Diagnosis not present

## 2014-02-26 DIAGNOSIS — Z85828 Personal history of other malignant neoplasm of skin: Secondary | ICD-10-CM | POA: Diagnosis not present

## 2014-02-28 ENCOUNTER — Other Ambulatory Visit: Payer: Self-pay | Admitting: Internal Medicine

## 2014-02-28 NOTE — Telephone Encounter (Signed)
Last refill 7.6.15; last OV 6.10.15.  Please advise refill.

## 2014-03-02 NOTE — Telephone Encounter (Signed)
Ok to refill,  Refill sent  

## 2014-03-06 DIAGNOSIS — H4010X0 Unspecified open-angle glaucoma, stage unspecified: Secondary | ICD-10-CM | POA: Diagnosis not present

## 2014-03-07 ENCOUNTER — Ambulatory Visit (INDEPENDENT_AMBULATORY_CARE_PROVIDER_SITE_OTHER): Payer: Medicare Other | Admitting: Internal Medicine

## 2014-03-07 ENCOUNTER — Encounter: Payer: Self-pay | Admitting: Internal Medicine

## 2014-03-07 VITALS — BP 146/80 | HR 75 | Temp 98.0°F | Resp 16 | Ht 62.0 in | Wt 180.0 lb

## 2014-03-07 DIAGNOSIS — R7301 Impaired fasting glucose: Secondary | ICD-10-CM

## 2014-03-07 DIAGNOSIS — K648 Other hemorrhoids: Secondary | ICD-10-CM | POA: Diagnosis not present

## 2014-03-07 DIAGNOSIS — E669 Obesity, unspecified: Secondary | ICD-10-CM | POA: Diagnosis not present

## 2014-03-07 DIAGNOSIS — M48061 Spinal stenosis, lumbar region without neurogenic claudication: Secondary | ICD-10-CM | POA: Diagnosis not present

## 2014-03-07 DIAGNOSIS — K760 Fatty (change of) liver, not elsewhere classified: Secondary | ICD-10-CM

## 2014-03-07 DIAGNOSIS — K7689 Other specified diseases of liver: Secondary | ICD-10-CM

## 2014-03-07 DIAGNOSIS — Z79899 Other long term (current) drug therapy: Secondary | ICD-10-CM | POA: Diagnosis not present

## 2014-03-07 DIAGNOSIS — K7581 Nonalcoholic steatohepatitis (NASH): Secondary | ICD-10-CM

## 2014-03-07 LAB — LIPID PANEL
CHOLESTEROL: 245 mg/dL — AB (ref 0–200)
HDL: 44.6 mg/dL (ref >39.00)
LDL Cholesterol: 169 mg/dL — ABNORMAL HIGH (ref 0–99)
NonHDL: 200.4
Total CHOL/HDL Ratio: 5
Triglycerides: 158 mg/dL — ABNORMAL HIGH (ref 0.0–149.0)
VLDL: 31.6 mg/dL (ref 0.0–40.0)

## 2014-03-07 LAB — COMPREHENSIVE METABOLIC PANEL
ALBUMIN: 4 g/dL (ref 3.5–5.2)
ALK PHOS: 75 U/L (ref 39–117)
ALT: 72 U/L — ABNORMAL HIGH (ref 0–35)
AST: 51 U/L — ABNORMAL HIGH (ref 0–37)
BUN: 16 mg/dL (ref 6–23)
CALCIUM: 9.4 mg/dL (ref 8.4–10.5)
CO2: 27 mEq/L (ref 19–32)
Chloride: 105 mEq/L (ref 96–112)
Creatinine, Ser: 0.9 mg/dL (ref 0.4–1.2)
GFR: 63.67 mL/min (ref >60.00)
GLUCOSE: 95 mg/dL (ref 70–99)
POTASSIUM: 4.4 meq/L (ref 3.5–5.1)
Sodium: 140 mEq/L (ref 135–145)
Total Bilirubin: 0.3 mg/dL (ref 0.2–1.2)
Total Protein: 7.3 g/dL (ref 6.0–8.3)

## 2014-03-07 LAB — MICROALBUMIN / CREATININE URINE RATIO
Creatinine,U: 31.9 mg/dL
MICROALB UR: 0.2 mg/dL (ref 0.0–1.9)
Microalb Creat Ratio: 0.6 mg/g (ref 0.0–30.0)

## 2014-03-07 LAB — HEMOGLOBIN A1C: Hgb A1c MFr Bld: 6.2 % (ref 4.6–6.5)

## 2014-03-07 NOTE — Assessment & Plan Note (Addendum)
Diagnosed by Dr. Candace Cruise.   Vaccinations for Hep A and B completed. I have recommended a low glycemic index diet utilizing smaller more frequent meals to increase metabolism.  I have also recommended that patient start exercising with a goal of 30 minutes of aerobic exercise a minimum of 5 days per week.     Lab Results  Component Value Date   ALT 72* 03/07/2014   AST 51* 03/07/2014   ALKPHOS 75 03/07/2014   BILITOT 0.3 03/07/2014

## 2014-03-07 NOTE — Progress Notes (Signed)
Patient ID: Traci Hall, female   DOB: 02-Aug-1945, 68 y.o.   MRN: [id number]   Patient Active Problem List   Diagnosis Date Noted  . Routine health maintenance 12/14/2013  . Dyspnea 06/27/2013  . Obesity, unspecified 05/21/2013  . Edema 05/19/2013  . Spinal stenosis at L4-L5 level 04/01/2013  . Rectal bleeding 04/01/2013  . Statin intolerance 02/23/2013  . Other and unspecified hyperlipidemia 01/11/2013  . Routine general medical examination at a health care facility 11/21/2012  . Acquired pes planus of both feet 11/21/2012  . Hearing loss 11/21/2012  . Impaired fasting glucose 11/21/2012  . Diverticulosis of colon with hemorrhage 11/04/2012  . Pain in joint, shoulder region 09/07/2012  . Barrett's esophagus 09/06/2012  . Breast inflammation 05/25/2012  . Chest pain 05/24/2012  . NASH (nonalcoholic steatohepatitis) 10/02/2011  . COPD 09/01/2011  . Rhinitis, allergic 09/01/2011  . OSA (obstructive sleep apnea) 08/25/2011    Subjective:  CC:   Chief Complaint  Patient presents with  . Follow-up  . Diabetes    HPI:   Traci Hall is a 68 y.o. female who presents for Follow up on chronic conditions including obesity with  OSA , NASH and impaired fasting glucose, . New cc of Insomnia.  Using CPAP ,  But still feeling tired all the time   Has auto titrating CPAP.  Sleep is disrupted by recurrent menopausal hot flashes.    Using patanol eye drops daily ,  also on Lumigan for glaucoma  She has not been exercising due to recent flare of back pain due to DDD with central canal stenosis at the L4-L5 level y 2/15 MRI,  Her back pain has finally improved after several sessions with a  chiropractor,  Wants to start walking again    Hemorrhoids bothering her . Has been referred to [place] having to have banding procedure this afternoon      Past Medical History  Diagnosis Date  . Asthma   . Allergic rhinitis   . OSA on CPAP   . COPD (chronic obstructive  pulmonary disease)     by prior PFTs  . Hyperlipidemia     Past Surgical History  Procedure Laterality Date  . Nasal sinus surgery  2008  . Appendectomy  1977  . Abdominal hysterectomy  1991    secondary to endometriosis, and polyps       The following portions of the patient's history were reviewed and updated as appropriate: Allergies, current medications, and problem list.    Review of Systems:   Patient denies headache, fevers, malaise, unintentional weight loss, skin rash, eye pain, sinus congestion and sinus pain, sore throat, dysphagia,  hemoptysis , cough, dyspnea, wheezing, chest pain, palpitations, orthopnea, edema, abdominal pain, nausea, melena, diarrhea, constipation, flank pain, dysuria, hematuria, urinary  Frequency, nocturia, numbness, tingling, seizures,  Focal weakness, Loss of consciousness,  Tremor, insomnia, depression, anxiety, and suicidal ideation.     History   Social History  . Marital Status: Married    Spouse Name: N/A    Number of Children: 2  . Years of Education: N/A   Occupational History  . retired     Social History Main Topics  . Smoking status: Former Smoker -- 1.50 packs/day for 45 years    Types: Cigarettes    Quit date: 06/15/2005  . Smokeless tobacco: Never Used  . Alcohol Use: Yes  . Drug Use: No  . Sexual Activity: Not on file   Other Topics Concern  .  Not on file   Social History Narrative  . No narrative on file    Objective:  Filed Vitals:   03/07/14 0939  BP: 146/80  Pulse: 75  Temp: 98 F (36.7 C)  Resp: 16     General appearance: alert, cooperative and appears stated age Ears: normal TM's and external ear canals both ears Throat: lips, mucosa, and tongue normal; teeth and gums normal Neck: no adenopathy, no carotid bruit, supple, symmetrical, trachea midline and thyroid not enlarged, symmetric, no tenderness/mass/nodules Back: symmetric, no curvature. ROM normal. No CVA tenderness. Lungs: clear to  auscultation bilaterally Heart: regular rate and rhythm, S1, S2 normal, no murmur, click, rub or gallop Abdomen: soft, non-tender; bowel sounds normal; no masses,  no organomegaly Pulses: 2+ and symmetric Skin: Skin color, texture, turgor normal. No rashes or lesions Lymph nodes: Cervical, supraclavicular, and axillary nodes normal.  Assessment and Plan:  NASH (nonalcoholic steatohepatitis) Diagnosed by Dr. Candace Cruise.   Vaccinations for Hep A and B completed. I have recommended a low glycemic index diet utilizing smaller more frequent meals to increase metabolism.  I have also recommended that patient start exercising with a goal of 30 minutes of aerobic exercise a minimum of 5 days per week.     Lab Results  Component Value Date   ALT 72* 03/07/2014   AST 51* 03/07/2014   ALKPHOS 75 03/07/2014   BILITOT 0.3 03/07/2014     Spinal stenosis at L4-L5 level Her MRI showed a large left paracentral and lateral recess disk herniaton with severe central canal stenosis and encroachment on the left L5 nerve root.  Referral to Neurosurgery was advised but deferred, and she is currently pain free .     Obesity, unspecified I have addressed  BMI and recommended a low glycemic index diet utilizing smaller more frequent meals to increase metabolism.  I have also recommended that patient start exercising with a goal of 30 minutes of aerobic exercise a minimum of 5 days per week. Screening for lipid disorders, thyroid and diabetes to be done today. Lab Results  Component Value Date   HGBA1C 6.2 03/07/2014   Lab Results  Component Value Date   CHOL 245* 03/07/2014   HDL 44.60 03/07/2014   LDLCALC 169* 03/07/2014   LDLDIRECT 200.2 05/19/2013   TRIG 158.0* 03/07/2014   CHOLHDL 5 03/07/2014   Lab Results  Component Value Date   TSH 3.17 11/23/2013       Updated Medication List Outpatient Encounter Prescriptions as of 03/07/2014  Medication Sig  . albuterol (PROVENTIL HFA;VENTOLIN HFA) 108 (90 BASE)  MCG/ACT inhaler Inhale 2 puffs into the lungs daily. Pt uses 2 puff qam and prn throughout day.  Marland Kitchen aspirin 81 MG tablet Take 81 mg by mouth daily.  . Bimatoprost (LUMIGAN OP) Apply to eye. 1 drop in each eye p.m.  Marland Kitchen calcium carbonate (TITRALAC) 420 MG CHEW chewable tablet Chew 420 mg by mouth.  . Cholecalciferol (VITAMIN D-3) 1000 UNITS CAPS Take 1 capsule by mouth daily.  Marland Kitchen desloratadine (CLARINEX) 5 MG tablet Take 1 tablet (5 mg total) by mouth daily.  Marland Kitchen esomeprazole (NEXIUM) 40 MG capsule Take 40 mg by mouth daily before breakfast.  . famotidine (PEPCID) 40 MG tablet Take 40 mg by mouth daily.  . Fluticasone Furoate-Vilanterol (BREO ELLIPTA) 100-25 MCG/INH AEPB Inhale 1 puff into the lungs daily.  . furosemide (LASIX) 20 MG tablet Take 1 tablet (20 mg total) by mouth daily. As needed for fluid retention  . hydrocortisone (  ANUSOL-HC) 25 MG suppository Place 1 suppository (25 mg total) rectally 2 (two) times daily.  . meloxicam (MOBIC) 15 MG tablet TAKE 1 TABLET DAILY  . montelukast (SINGULAIR) 10 MG tablet Take 1 tablet (10 mg total) by mouth at bedtime.  . Multiple Vitamins-Minerals (CENTRUM SILVER PO) Take by mouth. Take 1 am   . olopatadine (PATANOL) 0.1 % ophthalmic solution Place 1 drop into both eyes as needed.   [name] Glycol-Propyl Glycol (SYSTANE OP) Apply to eye. 1-2 drops in each eye as needed.  . traMADol (ULTRAM) 50 MG tablet Take 1 tablet (50 mg total) by mouth every 6 (six) hours as needed.  . vitamin C (ASCORBIC ACID) 500 MG tablet Take 500 mg by mouth daily.  . vitamin E (VITAMIN E) 400 UNIT capsule Take 400 Units by mouth daily.  . Wheat Dextrin (BENEFIBER DRINK MIX PO) Take by mouth. 2 teaspoons mixed into drink daily  . zolpidem (AMBIEN) 10 MG tablet Take 1 tablet (10 mg total) by mouth at bedtime as needed.     Orders Placed This Encounter  Procedures  . Hemoglobin A1c  . Microalbumin / creatinine urine ratio  . Comprehensive metabolic panel  . Lipid panel     Return in about 6 months (around 09/05/2014) for follow up diabetes.

## 2014-03-07 NOTE — Patient Instructions (Signed)
I recommend getting the majority of your calcium and Vitamin D  through diet rather than supplements given the recent association of calcium supplements with increased coronary artery calcium scores (You need 1200 mg daily )   Try the unsweetened almond/coconut milk by Rico Ala  At [place] is a great low calorie,  low carb, cholesterol free  way to increase your dietary calcium and vitamin D.  Try the blue Jackquline Bosch  I want you to lose 18 lbs this year (10%) This is  my version of a  "Low GI"  Diet:  It will still lower your blood sugars and allow you to lose 4 to 8  lbs  per month if you follow it carefully.  Your goal with exercise is a minimum of 30 minutes of aerobic exercise 5 days per week (Walking does not count once it becomes easy!)      All of the foods can be found at grocery stores and in bulk at [employer].  The Atkins protein bars and shakes are available in more varieties at [employer], [employer] and [place].     7 AM Breakfast:  Choose from the following:  Low carbohydrate Protein  Shakes (I recommend the EAS AdvantEdge "Carb Control" shakes  Or the low carb shakes by Atkins.    2.5 carbs   Arnold's "Sandwhich Thin"toasted  w/ peanut butter (no jelly: about 20 net carbs  "Bagel Thin" with cream cheese and salmon: about 20 carbs   a scrambled egg/bacon/cheese burrito made with Mission's "carb balance" whole wheat tortilla  (about 10 net carbs )  A slice of home made fritatta (egg based dish without a crust:  google it)    Avoid cereal and bananas, oatmeal and cream of wheat and grits. They are loaded with carbohydrates!   10 AM: high protein snack  Protein bar by Atkins (the snack size, under 200 cal, usually < 6 net carbs).    A stick of cheese:  Around 1 carb,  100 cal     Dannon Light n Fit [place] Yogurt  (80 cal, 8 carbs)  Other so called "protein bars" and Greek yogurts tend to be loaded with carbohydrates.  Remember, in food advertising, the word  "energy" is synonymous for " carbohydrate."  Lunch:   A Sandwich using the bread choices listed, Can use any  Eggs,  lunchmeat, grilled meat or canned tuna), avocado, regular mayo/mustard  and cheese.  A Salad using blue cheese, ranch,  Goddess or vinagrette,  No croutons or "confetti" and no "candied nuts" but regular nuts OK.   No pretzels or chips.  Pickles and miniature sweet peppers are a good low carb alternative that provide a "crunch"  The bread is the only source of carbohydrate in a sandwich and  can be decreased by trying some of these alternatives to traditional loaf bread  Joseph's makes a pita bread and a flat bread that are 50 cal and 4 net carbs available at [place] and [place].  This can be toasted to use with hummous as well  Toufayan makes a low carb flatbread that's 100 cal and 9 net carbs available at [employer] and [employer]'s makes 2 sizes of  Low carb whole wheat tortilla  (The large one is 210 cal and 6 net carbs) Avoid "Low fat dressings, as well as Kief dressings They are loaded with sugar!   3 PM/ Mid day  Snack:  Consider  1 ounce of  almonds, walnuts, pistachios, pecans, peanuts,  Macadamia nuts or a nut medley.  Avoid "granola"; the dried cranberries and raisins are loaded with carbohydrates. Mixed nuts as long as there are no raisins,  cranberries or dried fruit.    Try the prosciutto/mozzarella cheese sticks by Fiorruci  In deli /backery section   High protein   To avoid overindulging in snacks: Try drinking a glass of unsweeted almond/coconut milk  Or a cup of coffee with your Atkins chocolate bar t o keep you from having 3!!!        6 PM  Dinner:     Meat/fowl/fish with a green salad, and either broccoli, cauliflower, green beans, spinach, brussel sprouts or  Lima beans. DO NOT BREAD THE PROTEIN!!      There is a low carb pasta by Dreamfield's that is acceptable and tastes great: only 5 digestible carbs/serving.( All grocery stores but  BJs carry it )  Try Hurley Cisco Angelo's chicken piccata or chicken or eggplant parm over low carb pasta.(Lowes and BJs)   Marjory Lies Sanchez's "Carnitas" (pulled pork, no sauce,  0 carbs) or his beef pot roast to make a dinner burrito (at BJ's)  Pesto over low carb pasta (bj's sells a good quality pesto in the center refrigerated section of the deli   Try satueeing  Cheral Marker with mushroooms  Whole wheat pasta is still full of digestible carbs and  Not as low in glycemic index as Dreamfield's.   Brown rice is still rice,  So skip the rice and noodles if you eat [place] or [place] (or at least limit to 1/2 cup)  9 PM snack :   Breyer's "low carb" fudgsicle or  ice cream bar (Carb Smart line), or  Weight Watcher's ice cream bar , or another "no sugar added" ice cream;  a serving of fresh berries/cherries with whipped cream   Cheese or DANNON'S LlGHT N FIT GREEK YOGURT or the Oikos greek yogurt   8 ounces of [place] unsweetened almond/cococunut milk    Avoid bananas, pineapple, grapes  and watermelon on a regular basis because they are high in sugar.  THINK OF THEM AS DESSERT  Remember that snack Substitutions should be less than 10 NET carbs per serving and meals < 20 carbs. Remember to subtract fiber grams to get the "net carbs."

## 2014-03-07 NOTE — Progress Notes (Signed)
Pre-visit discussion using our clinic review tool. No additional management support is needed unless otherwise documented below in the visit note.  

## 2014-03-10 ENCOUNTER — Encounter: Payer: Self-pay | Admitting: Internal Medicine

## 2014-03-10 NOTE — Assessment & Plan Note (Addendum)
I have addressed  BMI and recommended a low glycemic index diet utilizing smaller more frequent meals to increase metabolism.  I have also recommended that patient start exercising with a goal of 30 minutes of aerobic exercise a minimum of 5 days per week. Screening for lipid disorders, thyroid and diabetes to be done today. Lab Results  Component Value Date   HGBA1C 6.2 03/07/2014   Lab Results  Component Value Date   CHOL 245* 03/07/2014   HDL 44.60 03/07/2014   LDLCALC 169* 03/07/2014   LDLDIRECT 200.2 05/19/2013   TRIG 158.0* 03/07/2014   CHOLHDL 5 03/07/2014   Lab Results  Component Value Date   TSH 3.17 11/23/2013

## 2014-03-10 NOTE — Assessment & Plan Note (Signed)
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  Referral to Neurosurgery was advised but deferred, and she is currently pain free .

## 2014-03-21 DIAGNOSIS — Z23 Encounter for immunization: Secondary | ICD-10-CM | POA: Diagnosis not present

## 2014-03-23 ENCOUNTER — Encounter: Payer: Self-pay | Admitting: Pulmonary Disease

## 2014-03-23 NOTE — Telephone Encounter (Signed)
Dr. McQuaid please advise.  Thank you.  

## 2014-03-26 ENCOUNTER — Encounter: Payer: Self-pay | Admitting: Internal Medicine

## 2014-03-30 ENCOUNTER — Encounter: Payer: Self-pay | Admitting: Internal Medicine

## 2014-04-09 DIAGNOSIS — K648 Other hemorrhoids: Secondary | ICD-10-CM | POA: Diagnosis not present

## 2014-05-02 DIAGNOSIS — K648 Other hemorrhoids: Secondary | ICD-10-CM | POA: Diagnosis not present

## 2014-05-13 ENCOUNTER — Other Ambulatory Visit: Payer: Self-pay | Admitting: Internal Medicine

## 2014-05-14 NOTE — Telephone Encounter (Signed)
Last visit 03/07/14, ok refill? 

## 2014-05-14 NOTE — Telephone Encounter (Signed)
Ok to refill,  Refill sent  

## 2014-05-30 ENCOUNTER — Ambulatory Visit (INDEPENDENT_AMBULATORY_CARE_PROVIDER_SITE_OTHER): Payer: Medicare Other | Admitting: Internal Medicine

## 2014-05-30 ENCOUNTER — Encounter: Payer: Self-pay | Admitting: Internal Medicine

## 2014-05-30 VITALS — BP 132/80 | HR 87 | Temp 99.0°F | Resp 16 | Ht 62.0 in | Wt 177.4 lb

## 2014-05-30 DIAGNOSIS — R7301 Impaired fasting glucose: Secondary | ICD-10-CM

## 2014-05-30 DIAGNOSIS — E669 Obesity, unspecified: Secondary | ICD-10-CM | POA: Diagnosis not present

## 2014-05-30 DIAGNOSIS — K648 Other hemorrhoids: Secondary | ICD-10-CM | POA: Diagnosis not present

## 2014-05-30 DIAGNOSIS — R350 Frequency of micturition: Secondary | ICD-10-CM | POA: Diagnosis not present

## 2014-05-30 DIAGNOSIS — K625 Hemorrhage of anus and rectum: Secondary | ICD-10-CM | POA: Diagnosis not present

## 2014-05-30 DIAGNOSIS — K921 Melena: Secondary | ICD-10-CM

## 2014-05-30 LAB — POCT URINALYSIS DIPSTICK
Bilirubin, UA: NEGATIVE
Glucose, UA: NEGATIVE
Ketones, UA: NEGATIVE
Nitrite, UA: NEGATIVE
PH UA: 6.5
PROTEIN UA: NEGATIVE
RBC UA: NEGATIVE
Spec Grav, UA: 1.015
UROBILINOGEN UA: 0.2

## 2014-05-30 LAB — URINALYSIS, ROUTINE W REFLEX MICROSCOPIC
Bilirubin Urine: NEGATIVE
Hgb urine dipstick: NEGATIVE
KETONES UR: NEGATIVE
Nitrite: NEGATIVE
PH: 6 (ref 5.0–8.0)
Specific Gravity, Urine: 1.015 (ref 1.000–1.030)
Total Protein, Urine: NEGATIVE
URINE GLUCOSE: NEGATIVE
UROBILINOGEN UA: 0.2 (ref 0.0–1.0)

## 2014-05-30 NOTE — Progress Notes (Addendum)
Patient ID: Traci Hall, female   DOB: 11-21-1945, 68 y.o.   MRN: [id number]   Patient Active Problem List   Diagnosis Date Noted  . Hematochezia 06/02/2014  . Increased urinary frequency 06/02/2014  . Routine health maintenance 12/14/2013  . Dyspnea 06/27/2013  . Obesity 05/21/2013  . Edema 05/19/2013  . Spinal stenosis at L4-L5 level 04/01/2013  . Rectal bleeding 04/01/2013  . Statin intolerance 02/23/2013  . Other and unspecified hyperlipidemia 01/11/2013  . Routine general medical examination at a health care facility 11/21/2012  . Acquired pes planus of both feet 11/21/2012  . Hearing loss 11/21/2012  . Impaired fasting glucose 11/21/2012  . Diverticulosis of colon with hemorrhage 11/04/2012  . Pain in joint, shoulder region 09/07/2012  . Barrett's esophagus 09/06/2012  . Breast inflammation 05/25/2012  . Chest pain 05/24/2012  . NASH (nonalcoholic steatohepatitis) 10/02/2011  . COPD 09/01/2011  . Rhinitis, allergic 09/01/2011  . OSA (obstructive sleep apnea) 08/25/2011    Subjective:  CC:   Chief Complaint  Patient presents with  . 6 mos follow up    HPI:   Traci Hall is a 68 y.o. female who presents for   [place] on chronic condition including obesity, NASH, and GERD.  She has been struggling with weight loss due to the stress of husband's medical conditions and recent hospitalizations.   STRESS EATING Had  Recurrent hematochezia, underwent HEMORRHOID BANDING X 2  BY wilton Smith.  Still seeing [facility] with stooling and has follow up with him this week . Her last  COLONOSCOPY BY [facility] WAS 2013  Has been having urinary frequency without dysuria, incontinence,  or suprapubic pain     Past Medical History  Diagnosis Date  . Asthma   . Allergic rhinitis   . OSA on CPAP   . COPD (chronic obstructive pulmonary disease)     by prior PFTs  . Hyperlipidemia     Past Surgical History  Procedure Laterality Date  . Nasal sinus surgery  2008   . Appendectomy  1977  . Abdominal hysterectomy  1991    secondary to endometriosis, and polyps       The following portions of the patient's history were reviewed and updated as appropriate: Allergies, current medications, and problem list.    Review of Systems:   Patient denies headache, fevers, malaise, unintentional weight loss, skin rash, eye pain, sinus congestion and sinus pain, sore throat, dysphagia,  hemoptysis , cough, dyspnea, wheezing, chest pain, palpitations, orthopnea, edema, abdominal pain, nausea, melena, diarrhea, constipation, flank pain, dysuria, hematuria, urinary  Frequency, nocturia, numbness, tingling, seizures,  Focal weakness, Loss of consciousness,  Tremor, insomnia, depression, anxiety, and suicidal ideation.     History   Social History  . Marital Status: Married    Spouse Name: N/A    Number of Children: 2  . Years of Education: N/A   Occupational History  . retired     Social History Main Topics  . Smoking status: Former Smoker -- 1.50 packs/day for 45 years    Types: Cigarettes    Quit date: 06/15/2005  . Smokeless tobacco: Never Used  . Alcohol Use: Yes  . Drug Use: No  . Sexual Activity: Not on file   Other Topics Concern  . Not on file   Social History Narrative    Objective:  Filed Vitals:   05/30/14 0913  BP: 132/80  Pulse: 87  Temp: 99 F (37.2 C)  Resp: 16  General appearance: alert, cooperative and appears stated age Ears: normal TM's and external ear canals both ears Throat: lips, mucosa, and tongue normal; teeth and gums normal Neck: no adenopathy, no carotid bruit, supple, symmetrical, trachea midline and thyroid not enlarged, symmetric, no tenderness/mass/nodules Back: symmetric, no curvature. ROM normal. No CVA tenderness. Lungs: clear to auscultation bilaterally Heart: regular rate and rhythm, S1, S2 normal, no murmur, click, rub or gallop Abdomen: soft, non-tender; bowel sounds normal; no masses,  no  organomegaly Pulses: 2+ and symmetric Skin: Skin color, texture, turgor normal. No rashes or lesions Lymph nodes: Cervical, supraclavicular, and axillary nodes normal.  Assessment and Plan: Problem List Items Addressed This Visit      Digestive   Rectal bleeding    Etiology may be persistent hemorrhoids but also has a history of diverticular bleeding,  Advised her to request anuscopy by Dr Chauncey Cruel th with her upcoming  visit and if the hemorrhoids are not the cause will need repeat colonoscopy      Endocrine   Impaired fasting glucose    Reviewed prior labs with attention to fasting  glucose of 140 in May 2014. A1cs have been < 6.5  on diet alone and are being checked regularly. Patient is up-to-date on annual eye exams and has no neuropathy or  proteinuria  .  Fasting lipids are elevated; statin therapy has been prescribed in the past but not tolerated arthralgias. Encouraged to resume healthy lifestyle  Including low GI diet regular exercise with goal wt loss of 20 lbs over next 6 months    Lab Results  Component Value Date   HGBA1C 6.2 03/07/2014   Lab Results  Component Value Date   MICROALBUR 0.2 03/07/2014   Lab Results  Component Value Date   CHOL 245* 03/07/2014   HDL 44.60 03/07/2014   LDLCALC 169* 03/07/2014   LDLDIRECT 200.2 05/19/2013   TRIG 158.0* 03/07/2014   CHOLHDL 5 03/07/2014             Other   Obesity    I have addressed  BMI and recommended wt loss of 10% of body weigh over the next 6 months using a low glycemic index diet and regular exercise a minimum of 5 days per week.      Hematochezia   Increased urinary frequency     Abnormal dipstick,  But urine culture was negative.       Other Visit Diagnoses    Urinary frequency    -  Primary    Relevant Orders       Urinalysis, Routine w reflex microscopic (Completed)       CULTURE, URINE COMPREHENSIVE (Completed)       POCT urinalysis dipstick (Completed)

## 2014-05-30 NOTE — Progress Notes (Signed)
Pre visit review using our clinic review tool, if applicable. No additional management support is needed unless otherwise documented below in the visit note. 

## 2014-05-30 NOTE — Patient Instructions (Addendum)
You are still technically NOT a diabetic until your fasting glucose is 125 or higher.  Return for fasting labs after Dec 23 (nothing since midnight  Except water, black coffee or tea no sugar)   I'll see you agajn in [place]

## 2014-06-01 LAB — CULTURE, URINE COMPREHENSIVE
COLONY COUNT: NO GROWTH
Organism ID, Bacteria: NO GROWTH

## 2014-06-02 ENCOUNTER — Encounter: Payer: Self-pay | Admitting: Internal Medicine

## 2014-06-02 DIAGNOSIS — K921 Melena: Secondary | ICD-10-CM | POA: Insufficient documentation

## 2014-06-02 DIAGNOSIS — R350 Frequency of micturition: Secondary | ICD-10-CM | POA: Insufficient documentation

## 2014-06-02 NOTE — Assessment & Plan Note (Signed)
Etiology may be persistent hemorrhoids but also has a history of diverticular bleeding,  Advised her to request anuscopy by Dr Chauncey Cruel th with her upcoming  visit and if the hemorrhoids are not the cause will need repeat colonoscopy

## 2014-06-02 NOTE — Addendum Note (Signed)
Addended by: [name] on: 06/02/2014 10:12 AM   Modules accepted: Level of Service

## 2014-06-02 NOTE — Assessment & Plan Note (Signed)
I have addressed  BMI and recommended wt loss of 10% of body weigh over the next 6 months using a low glycemic index diet and regular exercise a minimum of 5 days per week.   

## 2014-06-02 NOTE — Assessment & Plan Note (Signed)
untrreated with mediations secondary to recurrent trials leading to myalgias.   Lab Results  Component Value Date   CHOL 245* 03/07/2014   HDL 44.60 03/07/2014   LDLCALC 169* 03/07/2014   LDLDIRECT 200.2 05/19/2013   TRIG 158.0* 03/07/2014   CHOLHDL 5 03/07/2014

## 2014-06-02 NOTE — Assessment & Plan Note (Addendum)
Abnormal dipstick,  But urine culture was negative.

## 2014-06-02 NOTE — Assessment & Plan Note (Signed)
Reviewed prior labs with attention to fasting  glucose of 140 in May 2014. A1cs have been < 6.5  on diet alone and are being checked regularly. Patient is up-to-date on annual eye exams and has no neuropathy or  proteinuria  .  Fasting lipids are elevated; statin therapy has been prescribed in the past but not tolerated arthralgias. Encouraged to resume healthy lifestyle  Including low GI diet regular exercise with goal wt loss of 20 lbs over next 6 months    Lab Results  Component Value Date   HGBA1C 6.2 03/07/2014   Lab Results  Component Value Date   MICROALBUR 0.2 03/07/2014   Lab Results  Component Value Date   CHOL 245* 03/07/2014   HDL 44.60 03/07/2014   LDLCALC 169* 03/07/2014   LDLDIRECT 200.2 05/19/2013   TRIG 158.0* 03/07/2014   CHOLHDL 5 03/07/2014

## 2014-06-03 ENCOUNTER — Encounter: Payer: Self-pay | Admitting: Internal Medicine

## 2014-06-11 ENCOUNTER — Telehealth: Payer: Self-pay | Admitting: *Deleted

## 2014-06-11 DIAGNOSIS — K7581 Nonalcoholic steatohepatitis (NASH): Secondary | ICD-10-CM

## 2014-06-11 DIAGNOSIS — E785 Hyperlipidemia, unspecified: Secondary | ICD-10-CM

## 2014-06-11 DIAGNOSIS — R7301 Impaired fasting glucose: Secondary | ICD-10-CM

## 2014-06-11 NOTE — Telephone Encounter (Signed)
Pt is coming in tomorrow what labs and dx?  

## 2014-06-12 ENCOUNTER — Other Ambulatory Visit: Payer: Medicare Other

## 2014-07-11 ENCOUNTER — Other Ambulatory Visit: Payer: Self-pay

## 2014-07-11 MED ORDER — FLUTICASONE FUROATE-VILANTEROL 100-25 MCG/INH IN AEPB: 1.0000 | INHALATION_SPRAY | Freq: Every day | RESPIRATORY_TRACT | Status: DC

## 2014-07-19 ENCOUNTER — Encounter: Payer: Self-pay | Admitting: Pulmonary Disease

## 2014-07-19 ENCOUNTER — Ambulatory Visit (INDEPENDENT_AMBULATORY_CARE_PROVIDER_SITE_OTHER): Payer: Medicare Other | Admitting: Pulmonary Disease

## 2014-07-19 VITALS — BP 132/80 | HR 84 | Temp 98.0°F | Ht 62.0 in | Wt 183.0 lb

## 2014-07-19 DIAGNOSIS — J449 Chronic obstructive pulmonary disease, unspecified: Secondary | ICD-10-CM

## 2014-07-19 DIAGNOSIS — J301 Allergic rhinitis due to pollen: Secondary | ICD-10-CM

## 2014-07-19 NOTE — Assessment & Plan Note (Signed)
Today we reviewed the importance of taking a nasal steroid about 3 weeks before the pollen starts. We also reviewed the importance of using oral antihistamines. Saline rinses were discussed as well. She currently does not have symptoms but I provided education to try to help her prevent symptoms by using the medications appropriately.

## 2014-07-19 NOTE — Assessment & Plan Note (Signed)
This has been a stable interval for [place]. She has done well on her dose of Breo.  Plan: -Continue Breo as written

## 2014-07-19 NOTE — Patient Instructions (Signed)
We will see you back in 6 months  In the meantime, continue taking Breo  In the Spring, consider starting nasacort OTC 2 puffs each nostril daily in about April

## 2014-07-19 NOTE — Progress Notes (Signed)
Subjective:    Patient ID: Traci Hall, female    DOB: [birth year], 69 y.o.   MRN: [id number]  Synopsis: Traci Hall first saw [facility] in 08/2011 for shortness of breath.  She was diagnosed with Grade A COPD.  She previously smoked 1.5 ppd for 45 years.  She has allergic rhinitis as well. She completed pulmonary rehabilitation at [facility] in 2015.  With asthmatic component, FEV1 8% response to bronchodilator 2010 08/2011 spirometry F/F ratio 67% FEV1 1.74L (79% pred) 08/2011 mMRC 1 05/2012 CAT 7 07/2013 6MW> 1460 feet; O2 sat 97% RA 07/2013 spirometry  Ratio 84%, FEV1 1.92L (91% pred) 11/2013 6MW > 1625 feet  O2 94% RA 12/2013 PFT> Ratio 69%, FEV1 2.07L (110% pred, 0% change), TLC 3.70L (84% pred), DLCO (65% pred)  HPI Chief Complaint  Patient presents with  . Follow-up    pt c/o sob with exhertion,and chest tightness. She has no cough, or wheezing.   Traci Hall has been doing OK and she struggled with her husband being hospitalized back in December.  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  She has worked hard to keep the air clean during the process.  She says that so far her breathing has been OK.  She has not been exercising lately because of the renovation as it requires her to be around th house every day.    Past Medical History  Diagnosis Date  . Asthma   . Allergic rhinitis   . OSA on CPAP   . COPD (chronic obstructive pulmonary disease)     by prior PFTs  . Hyperlipidemia       Review of Systems  Constitutional: Negative for fever, chills and unexpected weight change.  HENT: Negative for ear pain, nosebleeds, postnasal drip, rhinorrhea and sneezing.   Respiratory: Negative for cough, choking, chest tightness and shortness of breath.   Cardiovascular: Negative for chest pain and leg swelling.       Objective:   Physical Exam  Filed Vitals:   07/19/14 0915  BP: 132/80   Pulse: 84  Temp: 98 F (36.7 C)  TempSrc: Oral  Height: 5\' 2"  (1.575 m)  Weight: 183 lb (83.008 kg)  SpO2: 97%  RA  Gen: well appearing, no acute distress HEENT: NCAT, EOMi, OP clear PULM: CTA B, few crackles in bases bilaterally CV: RRR, no mgr, no JVD AB: BS+, soft, nontender, no hsm Ext: warm, no edema, no clubbing, no cyanosis  08/2011 spirometry F/F ratio 67% FEV1 1.74L (79% pred) 08/2011 mMRC 1 05/2012 CAT 7 07/2013 6MW> 1460 feet; O2 sat 97% RA 07/2013 spirometry  Ratio 84%, FEV1 1.92L (91% pred) 11/2013 6MW > 1625 feet  O2 94% RA 12/2013 PFT> Ratio 69%, FEV1 2.07L (110% pred, 0% change), TLC 3.70L (84% pred), DLCO (65% pred)    Assessment & Plan:   COPD This has been a stable interval for Traci Hall. She has done well on her dose of Breo.  Plan: -Continue Breo as written   Rhinitis, allergic Today we reviewed the importance of taking a nasal steroid about 3 weeks before the pollen starts. We also reviewed the importance of using oral antihistamines. Saline rinses were discussed as well. She currently does not have symptoms but I provided education to try to help her prevent symptoms by using the medications appropriately.     Updated Medication List Outpatient Encounter Prescriptions as of 07/19/2014  Medication Sig  . albuterol (PROVENTIL HFA;VENTOLIN HFA) 108 (90 BASE) MCG/ACT inhaler Inhale 2 puffs into the lungs daily. Pt uses 2 puff qam and prn throughout day.  Marland Kitchen aspirin 81 MG tablet Take 81 mg by mouth daily.  . Bimatoprost (LUMIGAN OP) Apply to eye. 1 drop in each eye p.m.  Marland Kitchen calcium carbonate (TITRALAC) 420 MG CHEW chewable tablet Chew 420 mg by mouth.  . Cholecalciferol (VITAMIN D-3) 1000 UNITS CAPS Take 1 capsule by mouth daily.  Marland Kitchen desloratadine (CLARINEX) 5 MG tablet Take 1 tablet (5 mg total) by mouth daily.  Marland Kitchen esomeprazole (NEXIUM) 40 MG capsule Take 40 mg by mouth daily before breakfast.  . famotidine (PEPCID) 40 MG tablet Take 40 mg by mouth daily.  .  Fluticasone Furoate-Vilanterol (BREO ELLIPTA) 100-25 MCG/INH AEPB Inhale 1 puff into the lungs daily.  . furosemide (LASIX) 20 MG tablet Take 1 tablet (20 mg total) by mouth daily. As needed for fluid retention  . hydrocortisone (ANUSOL-HC) 25 MG suppository Place 1 suppository (25 mg total) rectally 2 (two) times daily.  . Influenza Vac Typ A&B Surf Ant 0.5 ML SUSY Inject 0.5 mcg into the muscle as needed.  . montelukast (SINGULAIR) 10 MG tablet Take 1 tablet (10 mg total) by mouth at bedtime.  . Multiple Vitamins-Minerals (CENTRUM SILVER PO) Take by mouth. Take 1 am   . olopatadine (PATANOL) 0.1 % ophthalmic solution Place 1 drop into both eyes as needed.   [name] Glycol-Propyl Glycol (SYSTANE OP) Apply to eye. 1-2 drops in each eye as needed.  . vitamin C (ASCORBIC ACID) 500 MG tablet Take 500 mg by mouth daily.  . vitamin E (VITAMIN E) 400 UNIT capsule Take 400 Units by mouth daily.  . Wheat Dextrin (BENEFIBER DRINK MIX PO) Take by mouth. 2 teaspoons mixed into drink daily  . zolpidem (AMBIEN) 10 MG tablet Take 1 tablet (10 mg total) by mouth at bedtime as needed.

## 2014-08-26 LAB — HM DIABETES EYE EXAM

## 2014-08-27 DIAGNOSIS — L859 Epidermal thickening, unspecified: Secondary | ICD-10-CM | POA: Diagnosis not present

## 2014-08-27 DIAGNOSIS — L720 Epidermal cyst: Secondary | ICD-10-CM | POA: Diagnosis not present

## 2014-08-27 DIAGNOSIS — L814 Other melanin hyperpigmentation: Secondary | ICD-10-CM | POA: Diagnosis not present

## 2014-08-27 DIAGNOSIS — Z1283 Encounter for screening for malignant neoplasm of skin: Secondary | ICD-10-CM | POA: Diagnosis not present

## 2014-08-27 DIAGNOSIS — Z85828 Personal history of other malignant neoplasm of skin: Secondary | ICD-10-CM | POA: Diagnosis not present

## 2014-08-27 DIAGNOSIS — L578 Other skin changes due to chronic exposure to nonionizing radiation: Secondary | ICD-10-CM | POA: Diagnosis not present

## 2014-08-27 DIAGNOSIS — D239 Other benign neoplasm of skin, unspecified: Secondary | ICD-10-CM | POA: Diagnosis not present

## 2014-08-27 DIAGNOSIS — D179 Benign lipomatous neoplasm, unspecified: Secondary | ICD-10-CM | POA: Diagnosis not present

## 2014-08-27 DIAGNOSIS — L304 Erythema intertrigo: Secondary | ICD-10-CM | POA: Diagnosis not present

## 2014-08-27 DIAGNOSIS — D1800 Hemangioma unspecified site: Secondary | ICD-10-CM | POA: Diagnosis not present

## 2014-08-30 ENCOUNTER — Ambulatory Visit: Payer: Medicare Other | Admitting: Internal Medicine

## 2014-08-30 ENCOUNTER — Ambulatory Visit (INDEPENDENT_AMBULATORY_CARE_PROVIDER_SITE_OTHER): Payer: Medicare Other | Admitting: Pulmonary Disease

## 2014-08-30 ENCOUNTER — Encounter: Payer: Self-pay | Admitting: Pulmonary Disease

## 2014-08-30 VITALS — BP 140/78 | HR 67 | Temp 87.1°F | Ht 62.0 in | Wt 181.4 lb

## 2014-08-30 DIAGNOSIS — G4733 Obstructive sleep apnea (adult) (pediatric): Secondary | ICD-10-CM | POA: Diagnosis not present

## 2014-08-30 NOTE — Assessment & Plan Note (Signed)
The patient continues to do well from a sleep apnea standpoint on her C Pap device. He is having no mask or pressure issues, and is satisfied with her sleep and daytime alertness. I have asked her to continue with her device, and to keep up with her mask cushion changes and supplies. I've also encouraged her to work aggressively on weight loss.

## 2014-08-30 NOTE — Patient Instructions (Signed)
Continue on cpap, and keep up with mask changes and supplies. Keep working on weight loss followup with me again in one year if doing well.

## 2014-08-30 NOTE — Progress Notes (Signed)
   Subjective:    Patient ID: Traci Hall, female    DOB: [birth year], 69 y.o.   MRN: [id number]  HPI The patient comes in today for follow-up of her obstructive sleep apnea. She is wearing C Pap compliantly, and is having no issues with her mask fit or pressure. Sleeping well, and has excellent daytime alertness.   Review of Systems  Constitutional: Negative for fever, chills and unexpected weight change.  HENT: Negative for congestion, dental problem, ear pain, nosebleeds, postnasal drip, rhinorrhea, sinus pressure, sneezing, sore throat, trouble swallowing and voice change.   Eyes: Negative for visual disturbance.  Respiratory: Negative for cough, choking and shortness of breath.   Cardiovascular: Negative for chest pain and leg swelling.  Gastrointestinal: Negative for vomiting, abdominal pain and diarrhea.  Genitourinary: Negative for difficulty urinating.  Musculoskeletal: Negative for arthralgias.  Skin: Negative for rash.  Neurological: Negative for tremors, syncope and headaches.  Hematological: Does not bruise/bleed easily.       Objective:   Physical Exam Overweight female in no acute distress Nose without purulence or discharge noted Neck without lymphadenopathy or thyromegaly No skin breakdown or pressure necrosis from the C Pap mask Lower extremities with mild edema, no cyanosis Alert and oriented, does not appear to be sleepy, moves all 4 extremities.       Assessment & Plan:

## 2014-09-05 ENCOUNTER — Other Ambulatory Visit: Payer: Medicare Other

## 2014-09-05 DIAGNOSIS — H4010X0 Unspecified open-angle glaucoma, stage unspecified: Secondary | ICD-10-CM | POA: Diagnosis not present

## 2014-09-06 ENCOUNTER — Ambulatory Visit: Payer: Medicare Other | Admitting: Internal Medicine

## 2014-09-12 DIAGNOSIS — H4010X0 Unspecified open-angle glaucoma, stage unspecified: Secondary | ICD-10-CM | POA: Diagnosis not present

## 2014-09-21 ENCOUNTER — Other Ambulatory Visit (INDEPENDENT_AMBULATORY_CARE_PROVIDER_SITE_OTHER): Payer: Medicare Other

## 2014-09-21 DIAGNOSIS — E785 Hyperlipidemia, unspecified: Secondary | ICD-10-CM

## 2014-09-21 DIAGNOSIS — R7301 Impaired fasting glucose: Secondary | ICD-10-CM | POA: Diagnosis not present

## 2014-09-21 DIAGNOSIS — K7581 Nonalcoholic steatohepatitis (NASH): Secondary | ICD-10-CM

## 2014-09-21 LAB — COMPREHENSIVE METABOLIC PANEL
ALBUMIN: 3.8 g/dL (ref 3.5–5.2)
ALK PHOS: 74 U/L (ref 39–117)
ALT: 49 U/L — AB (ref 0–35)
AST: 35 U/L (ref 0–37)
BILIRUBIN TOTAL: 0.4 mg/dL (ref 0.2–1.2)
BUN: 13 mg/dL (ref 6–23)
CALCIUM: 9.4 mg/dL (ref 8.4–10.5)
CO2: 29 mEq/L (ref 19–32)
CREATININE: 0.87 mg/dL (ref 0.40–1.20)
Chloride: 104 mEq/L (ref 96–112)
GFR: 68.65 mL/min (ref >60.00)
Glucose, Bld: 103 mg/dL — ABNORMAL HIGH (ref 70–99)
Potassium: 4.4 mEq/L (ref 3.5–5.1)
Sodium: 139 mEq/L (ref 135–145)
Total Protein: 6.5 g/dL (ref 6.0–8.3)

## 2014-09-21 LAB — LIPID PANEL
CHOL/HDL RATIO: 5
CHOLESTEROL: 229 mg/dL — AB (ref 0–200)
HDL: 49.2 mg/dL (ref >39.00)
LDL Cholesterol: 153 mg/dL — ABNORMAL HIGH (ref 0–99)
NonHDL: 179.8
TRIGLYCERIDES: 135 mg/dL (ref 0.0–149.0)
VLDL: 27 mg/dL (ref 0.0–40.0)

## 2014-09-21 LAB — HEMOGLOBIN A1C: Hgb A1c MFr Bld: 6.2 % (ref 4.6–6.5)

## 2014-09-24 ENCOUNTER — Encounter: Payer: Self-pay | Admitting: Internal Medicine

## 2014-09-26 ENCOUNTER — Encounter: Payer: Self-pay | Admitting: Internal Medicine

## 2014-10-03 ENCOUNTER — Other Ambulatory Visit: Payer: Self-pay | Admitting: Internal Medicine

## 2014-10-03 NOTE — Telephone Encounter (Signed)
90 day supply authorized and sent to mail order  

## 2014-10-03 NOTE — Telephone Encounter (Signed)
Last refill 05/30/14, ok refill?

## 2014-10-15 ENCOUNTER — Encounter: Payer: Self-pay | Admitting: Internal Medicine

## 2014-10-15 MED ORDER — ESOMEPRAZOLE MAGNESIUM 40 MG PO CPDR: 40.0000 mg | DELAYED_RELEASE_CAPSULE | Freq: Every day | ORAL | Status: DC

## 2014-10-15 NOTE — Telephone Encounter (Signed)
See other mychart message.

## 2014-10-15 NOTE — Telephone Encounter (Signed)
90 day supply authorized and sent   

## 2014-10-15 NOTE — Telephone Encounter (Signed)
Ok to fill Nexium for patient.  Filled historical provider patient request 90 day supply medication pended.

## 2014-11-10 DIAGNOSIS — L03113 Cellulitis of right upper limb: Secondary | ICD-10-CM | POA: Diagnosis not present

## 2014-11-12 DIAGNOSIS — L03113 Cellulitis of right upper limb: Secondary | ICD-10-CM | POA: Diagnosis not present

## 2014-11-14 DIAGNOSIS — L03113 Cellulitis of right upper limb: Secondary | ICD-10-CM | POA: Diagnosis not present

## 2014-11-22 ENCOUNTER — Other Ambulatory Visit: Payer: Self-pay

## 2014-11-22 MED ORDER — MONTELUKAST SODIUM 10 MG PO TABS: 10.0000 mg | ORAL_TABLET | Freq: Every day | ORAL | Status: DC

## 2014-11-26 ENCOUNTER — Encounter: Payer: Self-pay | Admitting: Internal Medicine

## 2014-11-26 ENCOUNTER — Ambulatory Visit (INDEPENDENT_AMBULATORY_CARE_PROVIDER_SITE_OTHER): Payer: Medicare Other | Admitting: Internal Medicine

## 2014-11-26 VITALS — BP 132/88 | HR 79 | Temp 98.7°F | Resp 14 | Ht 62.0 in | Wt 181.2 lb

## 2014-11-26 DIAGNOSIS — E669 Obesity, unspecified: Secondary | ICD-10-CM

## 2014-11-26 DIAGNOSIS — K760 Fatty (change of) liver, not elsewhere classified: Secondary | ICD-10-CM

## 2014-11-26 DIAGNOSIS — K7581 Nonalcoholic steatohepatitis (NASH): Secondary | ICD-10-CM

## 2014-11-26 DIAGNOSIS — G4733 Obstructive sleep apnea (adult) (pediatric): Secondary | ICD-10-CM

## 2014-11-26 DIAGNOSIS — Z0000 Encounter for general adult medical examination without abnormal findings: Secondary | ICD-10-CM

## 2014-11-26 DIAGNOSIS — E119 Type 2 diabetes mellitus without complications: Secondary | ICD-10-CM

## 2014-11-26 DIAGNOSIS — Z1239 Encounter for other screening for malignant neoplasm of breast: Secondary | ICD-10-CM

## 2014-11-26 DIAGNOSIS — E785 Hyperlipidemia, unspecified: Secondary | ICD-10-CM | POA: Diagnosis not present

## 2014-11-26 DIAGNOSIS — R030 Elevated blood-pressure reading, without diagnosis of hypertension: Secondary | ICD-10-CM

## 2014-11-26 DIAGNOSIS — Z13820 Encounter for screening for osteoporosis: Secondary | ICD-10-CM

## 2014-11-26 NOTE — Patient Instructions (Signed)

## 2014-11-26 NOTE — Progress Notes (Signed)
Patient ID: Traci Hall, female    DOB: [birth year]  Age: 69 y.o. MRN: [id number]  The patient is here for annual Medicare wellness examination and management of other chronic and acute problems.   The risk factors are reflected in the social history.  The roster of all physicians providing medical care to patient - is listed in the Snapshot section of the chart.  Activities of daily living:  The patient is 100% independent in all ADLs: dressing, toileting, feeding as well as independent mobility  Home safety : The patient has smoke detectors in the home. They wear seatbelts.  There are no firearms at home. There is no violence in the home.   There is no risks for hepatitis, STDs or HIV. There is no   history of blood transfusion. They have no travel history to infectious disease endemic areas of the world.  The patient has seen their dentist in the last six month. They have seen their eye doctor in the last year. They admit to slight hearing difficulty with regard to whispered voices and some television programs.  They have deferred audiologic testing in the last year.  They do not  have excessive sun exposure. Discussed the need for sun protection: hats, long sleeves and use of sunscreen if there is significant sun exposure.   Diet: the importance of a healthy diet is discussed. They do have a healthy diet.  The benefits of regular aerobic exercise were discussed. She walks 4 times per week ,  20 minutes.   Depression screen: there are no signs or vegative symptoms of depression- irritability, change in appetite, anhedonia, sadness/tearfullness.  Cognitive assessment: the patient manages all their financial and personal affairs and is actively engaged. They could relate day,date,year and events; recalled 2/3 objects at 3 minutes; performed clock-face test normally.  The following portions of the patient's history were reviewed and updated as appropriate: allergies, current medications,  past family history, past medical history,  past surgical history, past social history  and problem list.  Visual acuity was not assessed per patient preference since she has regular follow up with her ophthalmologist. Hearing and body mass index were assessed and reviewed.   During the course of the visit the patient was educated and counseled about appropriate screening and preventive services including : fall prevention , diabetes screening, nutrition counseling, colorectal cancer screening, and recommended immunizations.    CC: The primary encounter diagnosis was Screening for osteoporosis. Diagnoses of Breast cancer screening, Hepatic steatosis, OSA (obstructive sleep apnea), NASH (nonalcoholic steatohepatitis), Hyperlipidemia, Medicare annual wellness visit, subsequent, DM II (diabetes mellitus, type II), controlled, Obesity, and Elevated blood pressure reading without diagnosis of hypertension were also pertinent to this visit.   Was treated for abscess/skin infection around the left elbow which  Started with possbily an insect bite over Memorial day,  Developed fever , after receiving ceftriaxone injection by Urgent Care.  Fever to 102 for less than 24 hours,  Was treated with minocycline and Septra DS for  5 days  Still has a solid feeling fluid accumulation  at the elbow.  That is gradually improving.      History Traci Hall has a past medical history of Asthma; Allergic rhinitis; OSA on CPAP; COPD (chronic obstructive pulmonary disease); and Hyperlipidemia.   She has past surgical history that includes Nasal sinus surgery (2008); Appendectomy (1977); and Abdominal hysterectomy (1991).   Her family history includes COPD in her mother; Heart disease in her father; Hypertension in her mother.  There is no history of Cancer.She reports that she quit smoking about 9 years ago. Her smoking use included Cigarettes. She has a 67.5 pack-year smoking history. She has never used smokeless tobacco. She  reports that she drinks alcohol. She reports that she does not use illicit drugs.  Outpatient Prescriptions Prior to Visit  Medication Sig Dispense Refill  . albuterol (PROVENTIL HFA;VENTOLIN HFA) 108 (90 BASE) MCG/ACT inhaler Inhale 2 puffs into the lungs daily. Pt uses 2 puff qam and prn throughout day. 2 Inhaler 3  . aspirin 81 MG tablet Take 81 mg by mouth daily.    . Bimatoprost (LUMIGAN OP) Apply to eye. 1 drop in each eye p.m.    Marland Kitchen Calcium-Vitamin D-Vitamin K (CALCIUM SOFT CHEWS PO) Take by mouth daily.    . Cholecalciferol (VITAMIN D-3) 1000 UNITS CAPS Take 1 capsule by mouth daily.    Marland Kitchen desloratadine (CLARINEX) 5 MG tablet Take 1 tablet (5 mg total) by mouth daily. 90 tablet 3  . esomeprazole (NEXIUM) 40 MG capsule Take 1 capsule (40 mg total) by mouth daily before breakfast. 90 capsule 1  . famotidine (PEPCID) 40 MG tablet Take 40 mg by mouth daily.    . Fluticasone Furoate-Vilanterol (BREO ELLIPTA) 100-25 MCG/INH AEPB Inhale 1 puff into the lungs daily. 90 each 2  . furosemide (LASIX) 20 MG tablet Take 1 tablet (20 mg total) by mouth daily. As needed for fluid retention 30 tablet 3  . hydrocortisone (ANUSOL-HC) 25 MG suppository Place 1 suppository (25 mg total) rectally 2 (two) times daily. 12 suppository 0  . Influenza Vac Typ A&B Surf Ant 0.5 ML SUSY Inject 0.5 mcg into the muscle as needed.    . meloxicam (MOBIC) 15 MG tablet TAKE 1 TABLET DAILY 90 tablet 1  . montelukast (SINGULAIR) 10 MG tablet Take 1 tablet (10 mg total) by mouth at bedtime. 90 tablet 2  . Multiple Vitamins-Minerals (CENTRUM SILVER PO) Take by mouth. Take 1 am     . olopatadine (PATANOL) 0.1 % ophthalmic solution Place 1 drop into both eyes as needed.     [name] Glycol-Propyl Glycol (SYSTANE OP) Apply to eye. 1-2 drops in each eye as needed.    . vitamin C (ASCORBIC ACID) 500 MG tablet Take 500 mg by mouth daily.    . vitamin E (VITAMIN E) 400 UNIT capsule Take 400 Units by mouth daily.    . Wheat  Dextrin (BENEFIBER DRINK MIX PO) Take by mouth. 2 teaspoons mixed into drink daily    . zolpidem (AMBIEN) 10 MG tablet Take 1 tablet (10 mg total) by mouth at bedtime as needed. 30 tablet 3   No facility-administered medications prior to visit.    Review of Systems   Patient denies headache, fevers, malaise, unintentional weight loss, skin rash, eye pain, sinus congestion and sinus pain, sore throat, dysphagia,  hemoptysis , cough, dyspnea, wheezing, chest pain, palpitations, orthopnea, edema, abdominal pain, nausea, melena, diarrhea, constipation, flank pain, dysuria, hematuria, urinary  Frequency, nocturia, numbness, tingling, seizures,  Focal weakness, Loss of consciousness,  Tremor, insomnia, depression, anxiety, and suicidal ideation.      Objective:  BP 132/88 mmHg  Pulse 79  Temp(Src) 98.7 F (37.1 C) (Oral)  Resp 14  Ht 5\' 2"  (1.575 m)  Wt 181 lb 4 oz (82.214 kg)  BMI 33.14 kg/m2  SpO2 96%  Physical Exam   General appearance: alert, cooperative and appears stated age Head: Normocephalic, without obvious abnormality, atraumatic Eyes: conjunctivae/corneas clear. PERRL,  EOM's intact. Fundi benign. Ears: normal TM's and external ear canals both ears Nose: Nares normal. Septum midline. Mucosa normal. No drainage or sinus tenderness. Throat: lips, mucosa, and tongue normal; teeth and gums normal Neck: no adenopathy, no carotid bruit, no JVD, supple, symmetrical, trachea midline and thyroid not enlarged, symmetric, no tenderness/mass/nodules Lungs: clear to auscultation bilaterally Breasts: normal appearance, no masses or tenderness Heart: regular rate and rhythm, S1, S2 normal, no murmur, click, rub or gallop Abdomen: soft, non-tender; bowel sounds normal; no masses,  no organomegaly Extremities: extremities normal, atraumatic, no cyanosis or edema Pulses: 2+ and symmetric Skin: Skin color, texture, turgor normal. No rashes or lesions Neurologic: Alert and oriented X 3,  normal strength and tone. Normal symmetric reflexes. Normal coordination and gait.     Assessment & Plan:   Problem List Items Addressed This Visit    OSA (obstructive sleep apnea)    Diagnosed by sleep study. She is wearing her CPAP every night a minimum of 6 hours per night and notes improved daytime wakefulness and decreased fatigue        NASH (nonalcoholic steatohepatitis)    Diagnosed by Dr. Candace Cruise.   Vaccinations for Hep A and B completed. Continue weight management with  Adherence to a a low glycemic index diet utilizing smaller more frequent meals to increase metabolism and regular participation in exercise  with a goal of 30 minutes of aerobic exercise a minimum of 5 days per week.  Annul liver ultrasound has been ordered.     Lab Results  Component Value Date   ALT 49* 09/21/2014   AST 35 09/21/2014   ALKPHOS 74 09/21/2014   BILITOT 0.4 09/21/2014            Medicare annual wellness visit, subsequent    Annual Medicare wellness  exam was done as well as a comprehensive physical exam and management of acute and chronic conditions .  During the course of the visit the patient was educated and counseled about appropriate screening and preventive services including : fall prevention , diabetes screening, nutrition counseling, colorectal cancer screening, and recommended immunizations.  Printed recommendations for health maintenance screenings was given.       DM II (diabetes mellitus, type II), controlled    Diagnosed with fasting  glucose of 140 in May 2014. A1cs have been < 6.5  on diet alone and are being checked regularly. Patient is up-to-date on annual eye exams and has no neuropathy or  proteinuria  .  Fasting lipids are elevated; statin therapy has been prescribed in the past but not tolerated due to recurrent arthralgias. Encouraged to continue healthy lifestyle  Including low GI diet regular exercise with goal wt loss of 20 lbs over next 6 months    Lab Results   Component Value Date   HGBA1C 6.2 09/21/2014   Lab Results  Component Value Date   MICROALBUR 0.2 03/07/2014   Lab Results  Component Value Date   CHOL 229* 09/21/2014   HDL 49.20 09/21/2014   LDLCALC 153* 09/21/2014   LDLDIRECT 200.2 05/19/2013   TRIG 135.0 09/21/2014   CHOLHDL 5 09/21/2014               Hyperlipidemia    untreated with medications secondary to recurrent trials  Of statins leading to myalgias.   Lab Results  Component Value Date   CHOL 229* 09/21/2014   HDL 49.20 09/21/2014   LDLCALC 153* 09/21/2014   LDLDIRECT 200.2 05/19/2013   TRIG 135.0  09/21/2014   CHOLHDL 5 09/21/2014           Obesity    I have addressed  BMI and recommended wt loss of 10% of body weigh over the next 6 months using a low glycemic index diet and regular exercise a minimum of 5 days per week.  Body mass index is 33.14 kg/(m^2).   Wt Readings from Last 3 Encounters:  11/26/14 181 lb 4 oz (82.214 kg)  08/30/14 181 lb 6.4 oz (82.283 kg)  07/19/14 183 lb (83.008 kg)        Elevated blood pressure reading without diagnosis of hypertension    She  has no prior history of hypertension. She will check his blood pressure several times over the next 3-4 weeks and to submit readings for evaluation. Urine microalbumin to creatinine ratio done in the past has been normal.  Lab Results  Component Value Date   CREATININE 0.87 09/21/2014   Lab Results  Component Value Date   MICROALBUR 0.2 03/07/2014          Other Visit Diagnoses    Screening for osteoporosis    -  Primary    Relevant Orders    DG Bone Density    Breast cancer screening        Relevant Orders    MM DIGITAL SCREENING BILATERAL    Hepatic steatosis        Relevant Orders    US Abdomen Complete       I am having Ms. [facility] maintain her aspirin, Bimatoprost (LUMIGAN OP), olopatadine, Multiple Vitamins-Minerals (CENTRUM SILVER PO), vitamin C, vitamin E, zolpidem, desloratadine, Polyethyl  Glycol-Propyl Glycol (SYSTANE OP), famotidine, furosemide, Wheat Dextrin (BENEFIBER DRINK MIX PO), Vitamin D-3, hydrocortisone, albuterol, Fluticasone Furoate-Vilanterol, Influenza Vac Typ A&B Surf Ant, Calcium-Vitamin D-Vitamin K (CALCIUM SOFT CHEWS PO), meloxicam, esomeprazole, and montelukast.  No orders of the defined types were placed in this encounter.    There are no discontinued medications.  Follow-up: Return in about 4 months (around 03/28/2015) for follow up diabetes.   [name], MD

## 2014-11-26 NOTE — Progress Notes (Signed)
Pre-visit discussion using our clinic review tool. No additional management support is needed unless otherwise documented below in the visit note.  

## 2014-11-27 ENCOUNTER — Encounter: Payer: Self-pay | Admitting: Internal Medicine

## 2014-11-27 DIAGNOSIS — R030 Elevated blood-pressure reading, without diagnosis of hypertension: Secondary | ICD-10-CM | POA: Insufficient documentation

## 2014-11-27 NOTE — Assessment & Plan Note (Signed)

## 2014-11-27 NOTE — Assessment & Plan Note (Addendum)
Diagnosed with fasting  glucose of 140 in May 2014. A1cs have been < 6.5  on diet alone and are being checked regularly. Patient is up-to-date on annual eye exams and has no neuropathy or  proteinuria  .  Fasting lipids are elevated; statin therapy has been prescribed in the past but not tolerated due to recurrent arthralgias. Encouraged to continue healthy lifestyle  Including low GI diet regular exercise with goal wt loss of 20 lbs over next 6 months    Lab Results  Component Value Date   HGBA1C 6.2 09/21/2014   Lab Results  Component Value Date   MICROALBUR 0.2 03/07/2014   Lab Results  Component Value Date   CHOL 229* 09/21/2014   HDL 49.20 09/21/2014   LDLCALC 153* 09/21/2014   LDLDIRECT 200.2 05/19/2013   TRIG 135.0 09/21/2014   CHOLHDL 5 09/21/2014

## 2014-11-27 NOTE — Assessment & Plan Note (Signed)
Diagnosed by Dr. Candace Cruise.   Vaccinations for Hep A and B completed. Continue weight management with  Adherence to a a low glycemic index diet utilizing smaller more frequent meals to increase metabolism and regular participation in exercise  with a goal of 30 minutes of aerobic exercise a minimum of 5 days per week.  Annul liver ultrasound has been ordered.     Lab Results  Component Value Date   ALT 49* 09/21/2014   AST 35 09/21/2014   ALKPHOS 74 09/21/2014   BILITOT 0.4 09/21/2014

## 2014-11-27 NOTE — Assessment & Plan Note (Signed)
She  has no prior history of hypertension. She will check his blood pressure several times over the next 3-4 weeks and to submit readings for evaluation. Urine microalbumin to creatinine ratio done in the past has been normal.  Lab Results  Component Value Date   CREATININE 0.87 09/21/2014   Lab Results  Component Value Date   MICROALBUR 0.2 03/07/2014

## 2014-11-27 NOTE — Assessment & Plan Note (Signed)
I have addressed  BMI and recommended wt loss of 10% of body weigh over the next 6 months using a low glycemic index diet and regular exercise a minimum of 5 days per week.  Body mass index is 33.14 kg/(m^2).   Wt Readings from Last 3 Encounters:  11/26/14 181 lb 4 oz (82.214 kg)  08/30/14 181 lb 6.4 oz (82.283 kg)  07/19/14 183 lb (83.008 kg)

## 2014-11-27 NOTE — Assessment & Plan Note (Signed)
Diagnosed by sleep study. She is wearing her CPAP every night a minimum of 6 hours per night and notes improved daytime wakefulness and decreased fatigue  

## 2014-11-27 NOTE — Assessment & Plan Note (Signed)
untreated with medications secondary to recurrent trials  Of statins leading to myalgias.   Lab Results  Component Value Date   CHOL 229* 09/21/2014   HDL 49.20 09/21/2014   LDLCALC 153* 09/21/2014   LDLDIRECT 200.2 05/19/2013   TRIG 135.0 09/21/2014   CHOLHDL 5 09/21/2014

## 2014-12-04 ENCOUNTER — Other Ambulatory Visit: Payer: Self-pay | Admitting: Internal Medicine

## 2014-12-25 ENCOUNTER — Encounter: Payer: Self-pay | Admitting: Internal Medicine

## 2014-12-31 ENCOUNTER — Encounter: Payer: Self-pay | Admitting: Internal Medicine

## 2014-12-31 DIAGNOSIS — M85862 Other specified disorders of bone density and structure, left lower leg: Secondary | ICD-10-CM | POA: Diagnosis not present

## 2014-12-31 DIAGNOSIS — K760 Fatty (change of) liver, not elsewhere classified: Secondary | ICD-10-CM | POA: Diagnosis not present

## 2014-12-31 DIAGNOSIS — Z780 Asymptomatic menopausal state: Secondary | ICD-10-CM | POA: Diagnosis not present

## 2014-12-31 DIAGNOSIS — Z1231 Encounter for screening mammogram for malignant neoplasm of breast: Secondary | ICD-10-CM | POA: Diagnosis not present

## 2014-12-31 DIAGNOSIS — M8580 Other specified disorders of bone density and structure, unspecified site: Secondary | ICD-10-CM | POA: Diagnosis not present

## 2014-12-31 DIAGNOSIS — Z13820 Encounter for screening for osteoporosis: Secondary | ICD-10-CM | POA: Diagnosis not present

## 2014-12-31 LAB — HM DEXA SCAN: HM DEXA SCAN: NORMAL

## 2014-12-31 LAB — HM MAMMOGRAPHY: HM MAMMO: NORMAL

## 2015-01-01 ENCOUNTER — Telehealth: Payer: Self-pay | Admitting: Internal Medicine

## 2015-01-01 DIAGNOSIS — K7581 Nonalcoholic steatohepatitis (NASH): Secondary | ICD-10-CM

## 2015-01-01 NOTE — Telephone Encounter (Signed)
Mychart message sent.

## 2015-01-01 NOTE — Assessment & Plan Note (Signed)
Annual ultrasound done December 31 2014 continue to suggest shows fatty liver,  With no sign of liver masses. Continue lifestyle modfications with goal weight loss of 10% over the next 6 months using exercise and a low glycemic index diet.

## 2015-01-02 ENCOUNTER — Encounter: Payer: Self-pay | Admitting: Internal Medicine

## 2015-01-03 ENCOUNTER — Other Ambulatory Visit: Payer: Self-pay

## 2015-01-03 MED ORDER — DESLORATADINE 5 MG PO TABS: 5.0000 mg | ORAL_TABLET | Freq: Every day | ORAL | Status: DC

## 2015-01-07 ENCOUNTER — Telehealth: Payer: Self-pay | Admitting: Internal Medicine

## 2015-01-07 DIAGNOSIS — G4733 Obstructive sleep apnea (adult) (pediatric): Secondary | ICD-10-CM | POA: Diagnosis not present

## 2015-01-07 DIAGNOSIS — J387 Other diseases of larynx: Secondary | ICD-10-CM | POA: Diagnosis not present

## 2015-01-07 DIAGNOSIS — H903 Sensorineural hearing loss, bilateral: Secondary | ICD-10-CM | POA: Diagnosis not present

## 2015-01-07 NOTE — Telephone Encounter (Signed)
DEXA results sent via my chart

## 2015-01-08 ENCOUNTER — Encounter: Payer: Self-pay | Admitting: Family Medicine

## 2015-01-08 ENCOUNTER — Other Ambulatory Visit: Payer: Self-pay | Admitting: Family Medicine

## 2015-01-08 DIAGNOSIS — Z87891 Personal history of nicotine dependence: Secondary | ICD-10-CM

## 2015-01-08 HISTORY — DX: Personal history of nicotine dependence: Z87.891

## 2015-01-09 ENCOUNTER — Telehealth: Payer: Self-pay | Admitting: *Deleted

## 2015-01-09 NOTE — Telephone Encounter (Signed)
  Oncology Nurse Navigator Documentation    Navigator Encounter Type: Screening;Telephone (01/09/15 0900)               Notified patient that annual lung cancer screening low dose CT scan is due. Confirmed that patient is within the age range of 55-77, and asymptomatic, (no signs or symptoms of lung cancer). The patient is a former smoker, quit 9 years ago, with a 50 pack year history.

## 2015-01-11 ENCOUNTER — Ambulatory Visit
Admission: RE | Admit: 2015-01-11 | Discharge: 2015-01-11 | Disposition: A | Discharge status: Home / Self Care | Payer: Medicare Other | Source: Ambulatory Visit | Attending: Family Medicine | Admitting: Family Medicine

## 2015-01-11 DIAGNOSIS — I2510 Atherosclerotic heart disease of native coronary artery without angina pectoris: Secondary | ICD-10-CM | POA: Diagnosis not present

## 2015-01-11 DIAGNOSIS — J439 Emphysema, unspecified: Secondary | ICD-10-CM | POA: Diagnosis not present

## 2015-01-11 DIAGNOSIS — K760 Fatty (change of) liver, not elsewhere classified: Secondary | ICD-10-CM | POA: Diagnosis not present

## 2015-01-11 DIAGNOSIS — Z87891 Personal history of nicotine dependence: Secondary | ICD-10-CM | POA: Diagnosis not present

## 2015-01-14 ENCOUNTER — Ambulatory Visit: Payer: Medicare Other | Admitting: Pulmonary Disease

## 2015-01-16 ENCOUNTER — Telehealth: Payer: Self-pay | Admitting: *Deleted

## 2015-01-16 ENCOUNTER — Telehealth: Payer: Self-pay | Admitting: Internal Medicine

## 2015-01-16 ENCOUNTER — Telehealth: Payer: Self-pay

## 2015-01-16 NOTE — Telephone Encounter (Signed)
  Oncology Nurse Navigator Documentation  Oncology Nurse Navigator Flowsheets 01/09/2015 01/16/2015  Navigator Encounter Type Screening;Telephone (No Data)  Patient Visit Type Follow-up Follow-up  Time Spent with Patient 15 15   Impression from Low dose Ct scan screening read to Traci Hall  IMPRESSION: 1. Lung-RADS Category 2S, benign appearance or behavior. Continue annual screening with low-dose chest CT without contrast in 12 months. 2. The "S" modifier above refers to potentially clinically significant non lung cancer related findings. Specifically, there is atherosclerosis, including left main and 2 vessel coronary artery disease. Please note that although the presence of coronary artery calcium documents the presence of coronary artery disease, the severity of this disease and any potential stenosis cannot be assessed on this non-gated CT examination. Assessment for potential risk factor modification, dietary therapy or pharmacologic therapy may be warranted, if clinically indicated. 3. Mild diffuse bronchial thickening with mild centrilobular and paraseptal emphysema; imaging findings suggestive of underlying COPD. 4. Hepatic steatosis. 5. Additional incidental findings, as above.

## 2015-01-16 NOTE — Telephone Encounter (Signed)
-----   Message from Clent Jacks, RN sent at 01/16/2015  3:43 PM EDT -----  Kermit Balo afternoon, Here are the results from Low dose Ct screen.   IMPRESSION: 1. Lung-RADS Category 2S, benign appearance or behavior. Continue annual screening with low-dose chest CT without contrast in 12 months. 2. The "S" modifier above refers to potentially clinically significant non lung cancer related findings. Specifically, there is atherosclerosis, including left main and 2 vessel coronary artery disease. Please note that although the presence of coronary artery calcium documents the presence of coronary artery disease, the severity of this disease and any potential stenosis cannot be assessed on this non-gated CT examination. Assessment for potential risk factor modification, dietary therapy or pharmacologic therapy may be warranted, if clinically indicated. 3. Mild diffuse bronchial thickening with mild centrilobular and paraseptal emphysema; imaging findings suggestive of underlying COPD. 4. Hepatic steatosis. 5. Additional incidental findings, as above.

## 2015-01-16 NOTE — Telephone Encounter (Signed)
The CT scan of the lungs shows no change to the lung nodule,  So it is likely benign,  Repeat CT in 12 months is advised.  There was a significant amount of atherosclerosis  In her  Coronary arteries  That was seen on the CT so we need to discuss this in more detail.,  Please offer her an appointment,  You can add her to tomorrow afternoon if she is able otherwise it will have to be neg week or the following week

## 2015-01-17 NOTE — Telephone Encounter (Signed)
Patient scheduled for Mo patient notified of CT at [facility]

## 2015-01-17 NOTE — Telephone Encounter (Signed)
error 

## 2015-01-21 ENCOUNTER — Encounter: Payer: Self-pay | Admitting: Internal Medicine

## 2015-01-21 ENCOUNTER — Ambulatory Visit (INDEPENDENT_AMBULATORY_CARE_PROVIDER_SITE_OTHER): Payer: Medicare Other | Admitting: Internal Medicine

## 2015-01-21 VITALS — BP 144/80 | HR 77 | Temp 98.2°F | Resp 14 | Ht 62.0 in | Wt 183.5 lb

## 2015-01-21 DIAGNOSIS — I10 Essential (primary) hypertension: Secondary | ICD-10-CM

## 2015-01-21 DIAGNOSIS — I2510 Atherosclerotic heart disease of native coronary artery without angina pectoris: Secondary | ICD-10-CM | POA: Diagnosis not present

## 2015-01-21 MED ORDER — LISINOPRIL 5 MG PO TABS: 5.0000 mg | ORAL_TABLET | Freq: Every day | ORAL | Status: DC

## 2015-01-21 NOTE — Patient Instructions (Addendum)
Referral to Dr Fletcher Anon for his opinion on your coronary atherosclerosis   Continue your baby aspirin daily   Consider a trial of Zetia (see below)    Try to eat more fish and vegetables and do not skip meals   I am adding 5 mg lisinopril daily for blood pressure and heart. Please return in one week for a potassium level.     Ezetimibe Tablets What is this medicine? EZETIMIBE (ez ET i mibe) blocks the absorption of cholesterol from the stomach. It can help lower blood cholesterol for patients who are at risk of getting heart disease or a stroke. It is only for patients whose cholesterol level is not controlled by diet. This medicine may be used for other purposes; ask your health care provider or pharmacist if you have questions. COMMON BRAND NAME(S): Zetia What should I tell my health care provider before I take this medicine? They need to know if you have any of these conditions: -liver disease -an unusual or allergic reaction to ezetimibe, medicines, foods, dyes, or preservatives -pregnant or trying to get pregnant -breast-feeding How should I use this medicine? Take this medicine by mouth with a glass of water. Follow the directions on the prescription label. This medicine can be taken with or without food. Take your doses at regular intervals. Do not take your medicine more often than directed. Talk to your pediatrician regarding the use of this medicine in children. Special care may be needed. Overdosage: If you think you have taken too much of this medicine contact a poison control center or emergency room at once. NOTE: This medicine is only for you. Do not share this medicine with others. What if I miss a dose? If you miss a dose, take it as soon as you can. If it is almost time for your next dose, take only that dose. Do not take double or extra doses. What may interact with this medicine? Do not take this medicine with any of the following  medications: -fenofibrate -gemfibrozil This medicine may also interact with the following medications: -antacids -cyclosporine -herbal medicines like red yeast rice -other medicines to lower cholesterol or triglycerides This list may not describe all possible interactions. Give your health care provider a list of all the medicines, herbs, non-prescription drugs, or dietary supplements you use. Also tell them if you smoke, drink alcohol, or use illegal drugs. Some items may interact with your medicine. What should I watch for while using this medicine? Visit your doctor or health care professional for regular checks on your progress. You will need to have your cholesterol levels checked. If you are also taking some other cholesterol medicines, you will also need to have tests to make sure your liver is working properly. Tell your doctor or health care professional if you get any unexplained muscle pain, tenderness, or weakness, especially if you also have a fever and tiredness. You need to follow a low-cholesterol, low-fat diet while you are taking this medicine. This will decrease your risk of getting heart and blood vessel disease. Exercising and avoiding alcohol and smoking can also help. Ask your doctor or dietician for advice. What side effects may I notice from receiving this medicine? Side effects that you should report to your doctor or health care professional as soon as possible: -allergic reactions like skin rash, itching or hives, swelling of the face, lips, or tongue -dark yellow or brown urine -unusually weak or tired -yellowing of the skin or eyes Side effects that usually  do not require medical attention (report to your doctor or health care professional if they continue or are bothersome): -diarrhea -dizziness -headache -stomach upset or pain This list may not describe all possible side effects. Call your doctor for medical advice about side effects. You may report side  effects to FDA at 1-800-FDA-1088. Where should I keep my medicine? Keep out of the reach of children. Store at room temperature between 15 and 30 degrees C (59 and 86 degrees F). Protect from moisture. Keep container tightly closed. Throw away any unused medicine after the expiration date. NOTE: This sheet is a summary. It may not cover all possible information. If you have questions about this medicine, talk to your doctor, pharmacist, or health care provider.  2015, [employer] Standard. (2011-12-07 15:39:09)

## 2015-01-21 NOTE — Progress Notes (Signed)
Pre-visit discussion using our clinic review tool. No additional management support is needed unless otherwise documented below in the visit note.  

## 2015-01-21 NOTE — Progress Notes (Signed)
Subjective:  Patient ID: Traci Hall, female    DOB: Jun 02, 1946  Age: 69 y.o. MRN: [id number]  CC: The primary encounter diagnosis was Essential hypertension. A diagnosis of Atherosclerosis of native coronary artery of native heart without angina pectoris was also pertinent to this visit. HPI [facility] presents for follow up on 2 vessel coronary atherosclerosis (left main, LAD) noted on recent chest CT done to follow up on stabel pulmonary nodule.  Patient has no known history of  CAD,  Has had no recent chest pain.  Prior noninvasive testing was done years ago with exercise treadmill test.     She has diet controlled Type 2 DM and hyperlipidemia, and she has a long documented history of statin intolerance.   The CT results were reviewed with patient.  Pulmonary nodule is considered benign by behavior and repeat CT in 12 months.  Additional findings of emphysema and fatty liver were also reviewed .   HAS BEEN UNABLE TO WALK FOR EXERCISE DUE TO BACK MUSCLE SPASM  Diet reviewed       Outpatient Prescriptions Prior to Visit  Medication Sig Dispense Refill  . albuterol (PROVENTIL HFA;VENTOLIN HFA) 108 (90 BASE) MCG/ACT inhaler Inhale 2 puffs into the lungs daily. Pt uses 2 puff qam and prn throughout day. 2 Inhaler 3  . aspirin 81 MG tablet Take 81 mg by mouth daily.    . Bimatoprost (LUMIGAN OP) Apply to eye. 1 drop in each eye p.m.    Marland Kitchen Calcium-Vitamin D-Vitamin K (CALCIUM SOFT CHEWS PO) Take by mouth daily.    . Cholecalciferol (VITAMIN D-3) 1000 UNITS CAPS Take 1 capsule by mouth daily.    Marland Kitchen desloratadine (CLARINEX) 5 MG tablet Take 1 tablet (5 mg total) by mouth daily. 90 tablet 3  . esomeprazole (NEXIUM) 40 MG capsule Take 1 capsule (40 mg total) by mouth daily before breakfast. 90 capsule 1  . famotidine (PEPCID) 40 MG tablet Take 40 mg by mouth daily.    . Fluticasone Furoate-Vilanterol (BREO ELLIPTA) 100-25 MCG/INH AEPB Inhale 1 puff into the lungs daily. 90 each 2    . furosemide (LASIX) 20 MG tablet take 1 tablet by mouth once daily if needed 30 tablet 3  . hydrocortisone (ANUSOL-HC) 25 MG suppository Place 1 suppository (25 mg total) rectally 2 (two) times daily. 12 suppository 0  . Influenza Vac Typ A&B Surf Ant 0.5 ML SUSY Inject 0.5 mcg into the muscle as needed.    . meloxicam (MOBIC) 15 MG tablet TAKE 1 TABLET DAILY 90 tablet 1  . montelukast (SINGULAIR) 10 MG tablet Take 1 tablet (10 mg total) by mouth at bedtime. 90 tablet 2  . Multiple Vitamins-Minerals (CENTRUM SILVER PO) Take by mouth. Take 1 am     . olopatadine (PATANOL) 0.1 % ophthalmic solution Place 1 drop into both eyes as needed.     [name] Glycol-Propyl Glycol (SYSTANE OP) Apply to eye. 1-2 drops in each eye as needed.    . vitamin C (ASCORBIC ACID) 500 MG tablet Take 500 mg by mouth daily.    . vitamin E (VITAMIN E) 400 UNIT capsule Take 400 Units by mouth daily.    . Wheat Dextrin (BENEFIBER DRINK MIX PO) Take by mouth. 2 teaspoons mixed into drink daily    . zolpidem (AMBIEN) 10 MG tablet Take 1 tablet (10 mg total) by mouth at bedtime as needed. 30 tablet 3   No facility-administered medications prior to visit.    Review  of Systems;  Patient denies headache, fevers, malaise, unintentional weight loss, skin rash, eye pain, sinus congestion and sinus pain, sore throat, dysphagia,  hemoptysis , cough, dyspnea, wheezing, chest pain, palpitations, orthopnea, edema, abdominal pain, nausea, melena, diarrhea, constipation, flank pain, dysuria, hematuria, urinary  Frequency, nocturia, numbness, tingling, seizures,  Focal weakness, Loss of consciousness,  Tremor, insomnia, depression, anxiety, and suicidal ideation.      Objective:  BP 144/80 mmHg  Pulse 77  Temp(Src) 98.2 F (36.8 C) (Oral)  Resp 14  Ht 5\' 2"  (1.575 m)  Wt 183 lb 8 oz (83.235 kg)  BMI 33.55 kg/m2  SpO2 97%  BP Readings from Last 3 Encounters:  01/21/15 144/80  11/26/14 132/88  08/30/14 140/78    Wt  Readings from Last 3 Encounters:  01/21/15 183 lb 8 oz (83.235 kg)  01/11/15 183 lb 6.4 oz (83.19 kg)  11/26/14 181 lb 4 oz (82.214 kg)    General appearance: alert, cooperative and appears stated age Ears: normal TM's and external ear canals both ears Throat: lips, mucosa, and tongue normal; teeth and gums normal Neck: no adenopathy, no carotid bruit, supple, symmetrical, trachea midline and thyroid not enlarged, symmetric, no tenderness/mass/nodules Back: symmetric, no curvature. ROM normal. No CVA tenderness. Lungs: clear to auscultation bilaterally Heart: regular rate and rhythm, S1, S2 normal, no murmur, click, rub or gallop Abdomen: soft, non-tender; bowel sounds normal; no masses,  no organomegaly Pulses: 2+ and symmetric Skin: Skin color, texture, turgor normal. No rashes or lesions Lymph nodes: Cervical, supraclavicular, and axillary nodes normal.  Lab Results  Component Value Date   HGBA1C 6.2 09/21/2014   HGBA1C 6.2 03/07/2014   HGBA1C 6.0 11/23/2013    Lab Results  Component Value Date   CREATININE 0.87 09/21/2014   CREATININE 0.9 03/07/2014   CREATININE 0.9 11/23/2013    Lab Results  Component Value Date   WBC 8.2 11/23/2013   HGB 12.4 11/23/2013   HCT 36.8 11/23/2013   PLT 252.0 11/23/2013   GLUCOSE 103* 09/21/2014   CHOL 229* 09/21/2014   TRIG 135.0 09/21/2014   HDL 49.20 09/21/2014   LDLDIRECT 200.2 05/19/2013   LDLCALC 153* 09/21/2014   ALT 49* 09/21/2014   AST 35 09/21/2014   NA 139 09/21/2014   K 4.4 09/21/2014   CL 104 09/21/2014   CREATININE 0.87 09/21/2014   BUN 13 09/21/2014   CO2 29 09/21/2014   TSH 3.17 11/23/2013   HGBA1C 6.2 09/21/2014   MICROALBUR 0.2 03/07/2014    Ct Chest Lung Ca Screen Low Dose W/o Cm  01/11/2015   CLINICAL DATA:  69 year old female former smoker (quit 9 years ago) with 50 pack-year history of smoking. Lung cancer screening examination.  EXAM: CT CHEST WITHOUT CONTRAST  TECHNIQUE: Multidetector CT imaging of the  chest was performed following the standard protocol without IV contrast.  COMPARISON:  Low-dose lung cancer screening chest CT 12/22/2013.  FINDINGS: Mediastinum/Lymph Nodes: Heart size is normal. There is no significant pericardial fluid, thickening or pericardial calcification. There is atherosclerosis of the thoracic aorta, the great vessels of the mediastinum and the coronary arteries, including calcified atherosclerotic plaque in the left main, left anterior descending and right coronary arteries. No pathologically enlarged mediastinal or hilar lymph nodes. Please note that accurate exclusion of hilar adenopathy is limited on noncontrast CT scans. Esophagus is unremarkable in appearance. No axillary lymphadenopathy.  Lungs/Pleura: A few tiny pulmonary nodules are noted in the lungs bilaterally, which appear either stable or slightly decreased in size  compared to the prior examination from 12/22/2013, with the largest nodule having a volume derived mean diameter of only 2.5 mm in the medial aspect of the left upper lobe (image 63 of series 3), slightly smaller than the prior study. No new suspicious appearing pulmonary nodules or masses are otherwise noted. No acute consolidative airspace disease. No pleural effusions. Mild diffuse bronchial wall thickening with mild centrilobular and paraseptal emphysema. Small amount of subpleural reticulation in the periphery of the right lower lobe, unchanged compared to the prior examination.  Upper Abdomen: Diffuse low attenuation throughout the hepatic parenchyma, compatible with hepatic steatosis.  Musculoskeletal/Soft Tissues: There are no aggressive appearing lytic or blastic lesions noted in the visualized portions of the skeleton.  IMPRESSION: 1. Lung-RADS Category 2S, benign appearance or behavior. Continue annual screening with low-dose chest CT without contrast in 12 months. 2. The "S" modifier above refers to potentially clinically significant non lung cancer  related findings. Specifically, there is atherosclerosis, including left main and 2 vessel coronary artery disease. Please note that although the presence of coronary artery calcium documents the presence of coronary artery disease, the severity of this disease and any potential stenosis cannot be assessed on this non-gated CT examination. Assessment for potential risk factor modification, dietary therapy or pharmacologic therapy may be warranted, if clinically indicated. 3. Mild diffuse bronchial thickening with mild centrilobular and paraseptal emphysema; imaging findings suggestive of underlying COPD. 4. Hepatic steatosis. 5. Additional incidental findings, as above.   Electronically Signed   By: Vinnie Langton M.D.   On: 01/11/2015 15:19    Assessment & Plan:   Problem List Items Addressed This Visit      Unprioritized   Coronary atherosclerosis    Statin intolerant,  DM well controlled,  Starting A CE inhibitor ,  Avoiding beta blocker currently given history of COPD and pulse < 80.  Continue ASA. Consider trial of Zetia,  Printed handout given. eferral to Dr Fletcher Anon for confirmation via cath vs Coronary calcium scoring.       Relevant Medications   lisinopril (PRINIVIL,ZESTRIL) 5 MG tablet   Other Relevant Orders   Ambulatory referral to Cardiology    Other Visit Diagnoses    Essential hypertension    -  Primary    Relevant Medications    lisinopril (PRINIVIL,ZESTRIL) 5 MG tablet    Other Relevant Orders    Basic metabolic panel    Microalbumin / creatinine urine ratio       I am having [facility] start on lisinopril. I am also having her maintain her aspirin, Bimatoprost (LUMIGAN OP), olopatadine, Multiple Vitamins-Minerals (CENTRUM SILVER PO), vitamin C, vitamin E, zolpidem, Polyethyl Glycol-Propyl Glycol (SYSTANE OP), famotidine, Wheat Dextrin (BENEFIBER DRINK MIX PO), Vitamin D-3, hydrocortisone, albuterol, Fluticasone Furoate-Vilanterol, Influenza Vac Typ A&B Surf Ant,  Calcium-Vitamin D-Vitamin K (CALCIUM SOFT CHEWS PO), meloxicam, esomeprazole, montelukast, furosemide, and desloratadine.  Meds ordered this encounter  Medications  . lisinopril (PRINIVIL,ZESTRIL) 5 MG tablet    Sig: Take 1 tablet (5 mg total) by mouth daily.    Dispense:  90 tablet    Refill:  3   A total of 40 minutes was spent with patient more than half of which was spent in counseling patient on the above mentioned issues , reviewing and explaining recent labs and imaging studies done, and coordination of care. There are no discontinued medications.  Follow-up: No Follow-up on file.   [name], MD

## 2015-01-22 DIAGNOSIS — I2510 Atherosclerotic heart disease of native coronary artery without angina pectoris: Secondary | ICD-10-CM | POA: Insufficient documentation

## 2015-01-22 LAB — MICROALBUMIN / CREATININE URINE RATIO
Creatinine,U: 124.6 mg/dL
Microalb Creat Ratio: 0.6 mg/g (ref 0.0–30.0)
Microalb, Ur: 0.8 mg/dL (ref 0.0–1.9)

## 2015-01-22 NOTE — Assessment & Plan Note (Addendum)
Statin intolerant,  DM well controlled,  Starting A CE inhibitor ,  Avoiding beta blocker currently given history of COPD and pulse < 80.  Continue ASA. Consider trial of Zetia,  Printed handout given. eferral to Dr Fletcher Anon for confirmation via cath vs Coronary calcium scoring.

## 2015-01-25 ENCOUNTER — Encounter: Payer: Self-pay | Admitting: Internal Medicine

## 2015-01-28 ENCOUNTER — Encounter: Payer: Self-pay | Admitting: Pulmonary Disease

## 2015-01-28 ENCOUNTER — Ambulatory Visit: Payer: Medicare Other | Admitting: Cardiovascular Disease

## 2015-01-28 ENCOUNTER — Other Ambulatory Visit (INDEPENDENT_AMBULATORY_CARE_PROVIDER_SITE_OTHER): Payer: Medicare Other

## 2015-01-28 ENCOUNTER — Ambulatory Visit (INDEPENDENT_AMBULATORY_CARE_PROVIDER_SITE_OTHER): Payer: Medicare Other | Admitting: Pulmonary Disease

## 2015-01-28 VITALS — BP 150/90 | HR 74 | Temp 98.2°F | Ht 62.0 in | Wt 184.0 lb

## 2015-01-28 DIAGNOSIS — I2510 Atherosclerotic heart disease of native coronary artery without angina pectoris: Secondary | ICD-10-CM | POA: Diagnosis not present

## 2015-01-28 DIAGNOSIS — I10 Essential (primary) hypertension: Secondary | ICD-10-CM | POA: Diagnosis not present

## 2015-01-28 DIAGNOSIS — J449 Chronic obstructive pulmonary disease, unspecified: Secondary | ICD-10-CM | POA: Diagnosis not present

## 2015-01-28 LAB — BASIC METABOLIC PANEL
BUN: 11 mg/dL (ref 6–23)
CALCIUM: 9.5 mg/dL (ref 8.4–10.5)
CO2: 30 mEq/L (ref 19–32)
Chloride: 104 mEq/L (ref 96–112)
Creatinine, Ser: 0.89 mg/dL (ref 0.40–1.20)
GFR: 66.8 mL/min (ref >60.00)
GLUCOSE: 143 mg/dL — AB (ref 70–99)
Potassium: 4.1 mEq/L (ref 3.5–5.1)
SODIUM: 142 meq/L (ref 135–145)

## 2015-01-28 NOTE — Patient Instructions (Signed)
Cont current medications as you are currently taking. Follow up as needed. Keep up the good work  .Wilhelmina Mcardle, MD

## 2015-01-28 NOTE — Progress Notes (Signed)
She is here for routine follow up for mild COPD with an asthmatic component. She no longer smokes. She is well maintained on Breo, montelukast and PRN albuterol. She rarely needs albuterol - has only used it as a rescue medication a couple of times in the last six months. She does use it on a scheduled basis each morning before taking Breo.   EXAM: Pleasant, no distress HEENT: mild conjunctival injection, otherwise WNL No JVD Chest: full BS. No wheezes, no adventitious sounds RRR, no murmur Mildly obese, NABS, soft Ext without C/C/E  No new data to review  IMPRESSION: Mild COPD with an asthmatic component -well controlled on current regimen She also has OSA which was previously followed by [facility] in [place] and is now followed by Dr Pryor Ochoa, ENT.  PLAN: Cont current medical regimen as documented Follow up as needed for deterioration in control of her respiratory symptoms  Merton Border, MD

## 2015-01-29 ENCOUNTER — Encounter: Payer: Self-pay | Admitting: Internal Medicine

## 2015-02-27 ENCOUNTER — Ambulatory Visit: Payer: Medicare Other | Admitting: Cardiology

## 2015-03-01 ENCOUNTER — Encounter: Payer: Self-pay | Admitting: Cardiovascular Disease

## 2015-03-01 ENCOUNTER — Ambulatory Visit (INDEPENDENT_AMBULATORY_CARE_PROVIDER_SITE_OTHER): Payer: Medicare Other | Admitting: Cardiovascular Disease

## 2015-03-01 VITALS — BP 158/84 | HR 80 | Ht 62.0 in | Wt 185.8 lb

## 2015-03-01 DIAGNOSIS — I10 Essential (primary) hypertension: Secondary | ICD-10-CM

## 2015-03-01 DIAGNOSIS — I2510 Atherosclerotic heart disease of native coronary artery without angina pectoris: Secondary | ICD-10-CM | POA: Diagnosis not present

## 2015-03-01 DIAGNOSIS — E785 Hyperlipidemia, unspecified: Secondary | ICD-10-CM | POA: Diagnosis not present

## 2015-03-01 DIAGNOSIS — R0602 Shortness of breath: Secondary | ICD-10-CM

## 2015-03-01 DIAGNOSIS — I25118 Atherosclerotic heart disease of native coronary artery with other forms of angina pectoris: Secondary | ICD-10-CM | POA: Insufficient documentation

## 2015-03-01 MED ORDER — LOSARTAN POTASSIUM 25 MG PO TABS: 25.0000 mg | ORAL_TABLET | Freq: Every day | ORAL | Status: DC

## 2015-03-01 MED ORDER — EZETIMIBE 10 MG PO TABS: 10.0000 mg | ORAL_TABLET | Freq: Every day | ORAL | Status: DC

## 2015-03-01 NOTE — Progress Notes (Signed)
Primary care physician: Dr. Derrel Nip  HPI  This is a pleasant 69 year old female  who was referred for evaluation of coronary atherosclerosis noted on CT scan of the chest. She is not aware of any previous cardiac history. She has known history of diet-controlled diabetes, previous tobacco use with mild COPD with an asthmatic component, essential hypertension, GERD, hyperlipidemia with intolerance to statins and obesity. She quit smoking in 2007. She had a stress test done in 2009 in [place] which was normal. She has no family history of premature coronary artery disease. Her father died at the age of 52 due to valvular heart disease. She had a recent CT scan of the lungs as a follow-up for pulmonary nodule. It showed incidental finding of coronary calcifications involving mainly the left system including the left main, LAD and left circumflex. This was reviewed by me. She denies any chest discomfort. However, she complains of dyspnea with minimal activities which she has always attributed to her lung disease and deconditioning from obesity. She was recently started on lisinopril for hypertension. However, she developed severe dry cough and the medication was discontinued. She has prolonged history of severe hyperlipidemia and has tried almost all statins in the past including small dose Crestor with intolerance due to myalgia.  Allergies  Allergen Reactions  . Alendronate Sodium     Leg cramps *FOSAMAX*  . Demerol     Over sensitivity  . Naproxen     Leg cramps an swelling    . Statins     Cramps in legs      Current Outpatient Prescriptions on File Prior to Visit  Medication Sig Dispense Refill  . albuterol (PROVENTIL HFA;VENTOLIN HFA) 108 (90 BASE) MCG/ACT inhaler Inhale 2 puffs into the lungs daily. Pt uses 2 puff qam and prn throughout day. 2 Inhaler 3  . aspirin 81 MG tablet Take 81 mg by mouth daily.    . Bimatoprost (LUMIGAN OP) Apply to eye. 1 drop in each eye p.m.    Marland Kitchen  Calcium-Vitamin D-Vitamin K (CALCIUM SOFT CHEWS PO) Take by mouth daily.    . Cholecalciferol (VITAMIN D-3) 1000 UNITS CAPS Take 1 capsule by mouth daily.    Marland Kitchen desloratadine (CLARINEX) 5 MG tablet Take 1 tablet (5 mg total) by mouth daily. 90 tablet 3  . esomeprazole (NEXIUM) 40 MG capsule Take 1 capsule (40 mg total) by mouth daily before breakfast. 90 capsule 1  . famotidine (PEPCID) 40 MG tablet Take 40 mg by mouth daily.    . Fluticasone Furoate-Vilanterol (BREO ELLIPTA) 100-25 MCG/INH AEPB Inhale 1 puff into the lungs daily. 90 each 2  . furosemide (LASIX) 20 MG tablet take 1 tablet by mouth once daily if needed 30 tablet 3  . hydrocortisone (ANUSOL-HC) 25 MG suppository Place 1 suppository (25 mg total) rectally 2 (two) times daily. 12 suppository 0  . montelukast (SINGULAIR) 10 MG tablet Take 1 tablet (10 mg total) by mouth at bedtime. 90 tablet 2  . Multiple Vitamins-Minerals (CENTRUM SILVER PO) Take by mouth. Take 1 am     . olopatadine (PATANOL) 0.1 % ophthalmic solution Place 1 drop into both eyes as needed.     [name] Glycol-Propyl Glycol (SYSTANE OP) Apply to eye. 1-2 drops in each eye as needed.    . vitamin C (ASCORBIC ACID) 500 MG tablet Take 500 mg by mouth daily.    . vitamin E (VITAMIN E) 400 UNIT capsule Take 400 Units by mouth daily.    . Wheat  Dextrin (BENEFIBER DRINK MIX PO) Take by mouth. 2 teaspoons mixed into drink daily    . zolpidem (AMBIEN) 10 MG tablet Take 1 tablet (10 mg total) by mouth at bedtime as needed. 30 tablet 3   No current facility-administered medications on file prior to visit.     Past Medical History  Diagnosis Date  . Asthma   . Allergic rhinitis   . OSA on CPAP   . COPD (chronic obstructive pulmonary disease)     by prior PFTs  . Hyperlipidemia   . Personal history of tobacco use, presenting hazards to health 01/08/2015     Past Surgical History  Procedure Laterality Date  . Nasal sinus surgery  2008  . Appendectomy  1977  .  Abdominal hysterectomy  1991    secondary to endometriosis, and polyps     Family History  Problem Relation Age of Onset  . Heart disease Father     valvular cardiomyopathy,  mitral valve  . COPD Mother   . Hypertension Mother   . Cancer Neg Hx      Social History   Social History  . Marital Status: Married    Spouse Name: N/A  . Number of Children: 2  . Years of Education: N/A   Occupational History  . retired     Social History Main Topics  . Smoking status: Former Smoker -- 1.50 packs/day for 45 years    Types: Cigarettes    Quit date: 06/15/2005  . Smokeless tobacco: Never Used  . Alcohol Use: Yes  . Drug Use: No  . Sexual Activity: Not on file   Other Topics Concern  . Not on file   Social History Narrative     ROS A 10 point review of system was performed. It is negative other than that mentioned in the history of present illness.   PHYSICAL EXAM   BP 158/84 mmHg  Pulse 80  Ht 5\' 2"  (1.575 m)  Wt 185 lb 12 oz (84.256 kg)  BMI 33.97 kg/m2 Constitutional: She is oriented to person, place, and time. She appears well-developed and well-nourished. No distress.  HENT: No nasal discharge.  Head: Normocephalic and atraumatic.  Eyes: Pupils are equal and round. No discharge.  Neck: Normal range of motion. Neck supple. No JVD present. No thyromegaly present.  Cardiovascular: Normal rate, regular rhythm, normal heart sounds. Exam reveals no gallop and no friction rub. No murmur heard.  Pulmonary/Chest: Effort normal and breath sounds normal. No stridor. No respiratory distress. She has no wheezes. She has no rales. She exhibits no tenderness.  Abdominal: Soft. Bowel sounds are normal. She exhibits no distension. There is no tenderness. There is no rebound and no guarding.  Musculoskeletal: Normal range of motion. She exhibits no edema and no tenderness.  Neurological: She is alert and oriented to person, place, and time. Coordination normal.  Skin: Skin is  warm and dry. No rash noted. She is not diaphoretic. No erythema. No pallor.  Psychiatric: She has a normal mood and affect. Her behavior is normal. Judgment and thought content normal.     ZOX:WRUEA  Rhythm  - occasional PAC    # PACs = 1. -Diffuse ST depression   +   Nonspecific T-abnormality  -Nondiagnostic.   ABNORMAL     ASSESSMENT AND PLAN

## 2015-03-01 NOTE — Patient Instructions (Addendum)
Medication Instructions:  Your physician has recommended you make the following change in your medication:  START taking losartan 25mg  once per day START taking zetia 10mg  once per day   Labwork: Your physician recommends that you return for a FASTING lipid and liver profile in 6 weeks.   Testing/Procedures: Your physician has requested that you have a lexiscan myoview. For further information please visit [email or web address]. Please follow instruction sheet, as given.  [place]  Your caregiver has ordered a Stress Test with nuclear imaging. The purpose of this test is to evaluate the blood supply to your heart muscle. This procedure is referred to as a "Non-Invasive Stress Test." This is because other than having an IV started in your vein, nothing is inserted or "invades" your body. Cardiac stress tests are done to find areas of poor blood flow to the heart by determining the extent of coronary artery disease (CAD). Some patients exercise on a treadmill, which naturally increases the blood flow to your heart, while others who are  unable to walk on a treadmill due to physical limitations have a pharmacologic/chemical stress agent called Lexiscan . This medicine will mimic walking on a treadmill by temporarily increasing your coronary blood flow.   Please note: these test may take anywhere between 2-4 hours to complete  PLEASE REPORT TO [facility] AT THE FIRST DESK WILL DIRECT YOU WHERE TO GO  Date of Procedure: Tuesday, Sept 20, 8:30am Arrival Time for Procedure: 8:00am  Instructions regarding medication:   __xx__:  Hold other medications as follows:______hold lasix for comfort  ___________________________________________________________________________________________________________________________________________________________________________________________________________________________________________________________________________________  PLEASE NOTIFY THE OFFICE AT LEAST 24 HOURS IN ADVANCE IF YOU ARE UNABLE TO [place].  [phone] AND  PLEASE NOTIFY NUCLEAR MEDICINE AT [facility] AT LEAST 24 HOURS IN ADVANCE IF YOU ARE UNABLE TO KEEP YOUR APPOINTMENT. [phone]  How to prepare for your Myoview test:  1. Do not eat or drink after midnight 2. No caffeine for 24 hours prior to test 3. No smoking 24 hours prior to test. 4. Your medication may be taken with water.  If your doctor stopped a medication because of this test, do not take that medication. 5. Ladies, please do not wear dresses.  Skirts or pants are appropriate. Please wear a short sleeve shirt. 6. No perfume, cologne or lotion. 7. Wear comfortable walking shoes. No heels!            Follow-Up: Your physician recommends that you schedule a follow-up appointment in: one month with Dr. Fletcher Anon.    Any Other Special Instructions Will Be Listed Below (If Applicable).

## 2015-03-01 NOTE — Assessment & Plan Note (Signed)
She seems to be truly intolerant to all statins. Thus, I agree with Zetia 10 mg once daily. PCSK 9 inhibitors can be considered if she has evidence of obstructive coronary artery disease with inability to achieve target LDL with Zetia.

## 2015-03-01 NOTE — Assessment & Plan Note (Signed)
Given the intolerance to lisinopril due to cough, I agree with losartan 25 mg once daily which was started today.

## 2015-03-01 NOTE — Assessment & Plan Note (Signed)
Recent CT scan showed evidence of coronary calcification which indicates the presence of coronary atherosclerosis. I think we need to determine if this is causing any obstructive disease or not. She has no chest pain at the present time. However, she has dyspnea with minimal activities which could be angina equivalent or could be simply multifactorial due to her lung disease and deconditioning. Her baseline ECG does show some global ST depression which is concerning. I recommend evaluation with a treadmill nuclear stress test and would have a low threshold for cardiac catheterization. The meantime, I recommend aggressive treatment of her risk factors. Continue low-dose aspirin.

## 2015-03-05 ENCOUNTER — Ambulatory Visit
Admission: RE | Admit: 2015-03-05 | Discharge: 2015-03-05 | Disposition: A | Discharge status: Home / Self Care | Payer: Medicare Other | Source: Ambulatory Visit | Attending: Cardiovascular Disease | Admitting: Cardiovascular Disease

## 2015-03-05 DIAGNOSIS — R0602 Shortness of breath: Secondary | ICD-10-CM | POA: Diagnosis not present

## 2015-03-05 HISTORY — DX: Essential (primary) hypertension: I10

## 2015-03-05 LAB — NM MYOCAR MULTI W/SPECT W/WALL MOTION / EF
CHL CUP NUCLEAR SSS: 10
CHL CUP STRESS STAGE 1 GRADE: 0 %
CHL CUP STRESS STAGE 1 SPEED: 0.1 mph
CHL CUP STRESS STAGE 2 GRADE: 0 %
CHL CUP STRESS STAGE 2 HR: 89 {beats}/min
CHL CUP STRESS STAGE 3 DBP: 132 mmHg
CHL CUP STRESS STAGE 3 SPEED: 1.7 mph
CHL CUP STRESS STAGE 4 HR: 133 {beats}/min
CHL CUP STRESS STAGE 5 GRADE: 0 %
CHL CUP STRESS STAGE 5 HR: 87 {beats}/min
CHL CUP STRESS STAGE 5 SBP: 140 mmHg
CSEPEW: 4.6 METS
LV dias vol: 53 mL
LVSYSVOL: 9 mL
NUC STRESS TID: 1.04
Peak BP: 208 mmHg
Peak HR: 150 {beats}/min
Percent HR: 105 %
Percent of predicted max HR: 99 %
Rest HR: 85 {beats}/min
SDS: 8
SRS: 3
Stage 1 HR: 86 {beats}/min
Stage 2 Speed: 0.1 mph
Stage 3 Grade: 10 %
Stage 3 HR: 150 {beats}/min
Stage 3 SBP: 208 mmHg
Stage 4 Grade: 0 %
Stage 4 Speed: 0 mph
Stage 5 DBP: 77 mmHg
Stage 5 Speed: 0 mph

## 2015-03-05 MED ORDER — TECHNETIUM TC 99M SESTAMIBI - CARDIOLITE
10.0000 | Freq: Once | INTRAVENOUS | Status: AC | PRN
Start: 2015-03-05 — End: 2015-03-05
  Administered 2015-03-05: 12.16 via INTRAVENOUS

## 2015-03-05 MED ORDER — TECHNETIUM TC 99M SESTAMIBI - CARDIOLITE
30.0000 | Freq: Once | INTRAVENOUS | Status: AC | PRN
  Administered 2015-03-05: 32.37 via INTRAVENOUS

## 2015-03-06 ENCOUNTER — Telehealth: Payer: Self-pay | Admitting: Cardiovascular Disease

## 2015-03-06 NOTE — Telephone Encounter (Signed)
Please call with NM results

## 2015-03-10 ENCOUNTER — Encounter: Payer: Self-pay | Admitting: Internal Medicine

## 2015-03-11 ENCOUNTER — Other Ambulatory Visit: Payer: Self-pay | Admitting: Internal Medicine

## 2015-03-11 DIAGNOSIS — R7303 Prediabetes: Secondary | ICD-10-CM

## 2015-03-15 DIAGNOSIS — H43813 Vitreous degeneration, bilateral: Secondary | ICD-10-CM | POA: Diagnosis not present

## 2015-03-22 ENCOUNTER — Other Ambulatory Visit: Payer: Medicare Other

## 2015-03-26 ENCOUNTER — Other Ambulatory Visit: Payer: Self-pay | Admitting: Internal Medicine

## 2015-04-10 ENCOUNTER — Ambulatory Visit: Payer: Medicare Other | Admitting: Internal Medicine

## 2015-04-12 ENCOUNTER — Other Ambulatory Visit (INDEPENDENT_AMBULATORY_CARE_PROVIDER_SITE_OTHER): Payer: Medicare Other | Admitting: *Deleted

## 2015-04-12 DIAGNOSIS — E785 Hyperlipidemia, unspecified: Secondary | ICD-10-CM

## 2015-04-13 LAB — HEPATIC FUNCTION PANEL
ALBUMIN: 4 g/dL (ref 3.6–4.8)
ALT: 69 IU/L — ABNORMAL HIGH (ref 0–32)
AST: 45 IU/L — ABNORMAL HIGH (ref 0–40)
Alkaline Phosphatase: 85 IU/L (ref 39–117)
BILIRUBIN TOTAL: 0.3 mg/dL (ref 0.0–1.2)
BILIRUBIN, DIRECT: 0.12 mg/dL (ref 0.00–0.40)
TOTAL PROTEIN: 6.4 g/dL (ref 6.0–8.5)

## 2015-04-13 LAB — LIPID PANEL
CHOLESTEROL TOTAL: 216 mg/dL — AB (ref 100–199)
Chol/HDL Ratio: 4.2 ratio units (ref 0.0–4.4)
HDL: 51 mg/dL (ref >39)
LDL CALC: 131 mg/dL — AB (ref 0–99)
Triglycerides: 168 mg/dL — ABNORMAL HIGH (ref 0–149)
VLDL CHOLESTEROL CAL: 34 mg/dL (ref 5–40)

## 2015-04-18 ENCOUNTER — Encounter: Payer: Self-pay | Admitting: Cardiovascular Disease

## 2015-04-18 ENCOUNTER — Ambulatory Visit (INDEPENDENT_AMBULATORY_CARE_PROVIDER_SITE_OTHER): Payer: Medicare Other | Admitting: Cardiovascular Disease

## 2015-04-18 VITALS — BP 128/62 | HR 79 | Ht 62.0 in | Wt 184.8 lb

## 2015-04-18 DIAGNOSIS — E785 Hyperlipidemia, unspecified: Secondary | ICD-10-CM | POA: Diagnosis not present

## 2015-04-18 DIAGNOSIS — I10 Essential (primary) hypertension: Secondary | ICD-10-CM

## 2015-04-18 DIAGNOSIS — I25118 Atherosclerotic heart disease of native coronary artery with other forms of angina pectoris: Secondary | ICD-10-CM

## 2015-04-18 DIAGNOSIS — I2510 Atherosclerotic heart disease of native coronary artery without angina pectoris: Secondary | ICD-10-CM

## 2015-04-18 MED ORDER — EZETIMIBE 10 MG PO TABS: 10.0000 mg | ORAL_TABLET | Freq: Every day | ORAL | Status: DC

## 2015-04-18 MED ORDER — LOSARTAN POTASSIUM 25 MG PO TABS: 25.0000 mg | ORAL_TABLET | Freq: Every day | ORAL | Status: DC

## 2015-04-18 NOTE — Progress Notes (Signed)
Primary care physician: Dr. Derrel Nip  HPI  This is a pleasant 69 year old female  who is here today for a follow-up visit regarding  coronary atherosclerosis noted on CT scan of the chest.  She has known history of diet-controlled diabetes, previous tobacco use with mild COPD with an asthmatic component, essential hypertension, GERD, hyperlipidemia with intolerance to statins and obesity. She quit smoking in 2007. She has no family history of premature coronary artery disease.  Her symptoms included exertional dyspnea without chest pain.  She is known to have hyperlipidemia with intolerance to statins including small dose rosuvastatin. I proceeded with an exercise nuclear stress test which overall was low risk. She did have mild ischemic EKG changes with exercise but perfusion was normal and ejection fraction was greater than 65%. She was started on Zetia for hyperlipidemia. Lisinopril was switched to losartan due to cough. Exertional dyspnea is still unchanged.    Allergies  Allergen Reactions  . Alendronate Sodium     Leg cramps *FOSAMAX*  . Demerol     Over sensitivity  . Naproxen     Leg cramps an swelling    . Statins     Cramps in legs      Current Outpatient Prescriptions on File Prior to Visit  Medication Sig Dispense Refill  . albuterol (PROVENTIL HFA;VENTOLIN HFA) 108 (90 BASE) MCG/ACT inhaler Inhale 2 puffs into the lungs daily. Pt uses 2 puff qam and prn throughout day. 2 Inhaler 3  . aspirin 81 MG tablet Take 81 mg by mouth daily.    . Calcium-Vitamin D-Vitamin K (CALCIUM SOFT CHEWS PO) Take by mouth daily.    . Cholecalciferol (VITAMIN D-3) 1000 UNITS CAPS Take 1 capsule by mouth daily.    Marland Kitchen desloratadine (CLARINEX) 5 MG tablet Take 1 tablet (5 mg total) by mouth daily. 90 tablet 3  . ezetimibe (ZETIA) 10 MG tablet Take 1 tablet (10 mg total) by mouth daily. 30 tablet 11  . famotidine (PEPCID) 40 MG tablet Take 40 mg by mouth daily.    . Fluticasone Furoate-Vilanterol  (BREO ELLIPTA) 100-25 MCG/INH AEPB Inhale 1 puff into the lungs daily. 90 each 2  . furosemide (LASIX) 20 MG tablet take 1 tablet by mouth once daily if needed 30 tablet 3  . hydrocortisone (ANUSOL-HC) 25 MG suppository Place 1 suppository (25 mg total) rectally 2 (two) times daily. 12 suppository 0  . losartan (COZAAR) 25 MG tablet Take 1 tablet (25 mg total) by mouth daily. 30 tablet 11  . montelukast (SINGULAIR) 10 MG tablet Take 1 tablet (10 mg total) by mouth at bedtime. 90 tablet 2  . Multiple Vitamins-Minerals (CENTRUM SILVER PO) Take by mouth. Take 1 am     . NEXIUM 40 MG capsule TAKE 1 CAPSULE DAILY BEFORE BREAKFAST 90 capsule 0  . olopatadine (PATANOL) 0.1 % ophthalmic solution Place 1 drop into both eyes as needed.     [name] Glycol-Propyl Glycol (SYSTANE OP) Apply to eye. 1-2 drops in each eye as needed.    . vitamin C (ASCORBIC ACID) 500 MG tablet Take 500 mg by mouth daily.    . vitamin E (VITAMIN E) 400 UNIT capsule Take 400 Units by mouth daily.    . Wheat Dextrin (BENEFIBER DRINK MIX PO) Take by mouth. 2 teaspoons mixed into drink daily    . zolpidem (AMBIEN) 10 MG tablet Take 1 tablet (10 mg total) by mouth at bedtime as needed. 30 tablet 3   No current facility-administered medications on  file prior to visit.     Past Medical History  Diagnosis Date  . Asthma   . Allergic rhinitis   . OSA on CPAP   . COPD (chronic obstructive pulmonary disease) ([place])     by prior PFTs  . Hyperlipidemia   . Personal history of tobacco use, presenting hazards to health 01/08/2015  . Hypertension      Past Surgical History  Procedure Laterality Date  . Nasal sinus surgery  2008  . Appendectomy  1977  . Abdominal hysterectomy  1991    secondary to endometriosis, and polyps     Family History  Problem Relation Age of Onset  . Heart disease Father     valvular cardiomyopathy,  mitral valve  . COPD Mother   . Hypertension Mother   . Cancer Neg Hx      Social History     Social History  . Marital Status: Married    Spouse Name: N/A  . Number of Children: 2  . Years of Education: N/A   Occupational History  . retired     Social History Main Topics  . Smoking status: Former Smoker -- 1.50 packs/day for 45 years    Types: Cigarettes    Quit date: 06/15/2005  . Smokeless tobacco: Never Used  . Alcohol Use: Yes  . Drug Use: No  . Sexual Activity: Not on file   Other Topics Concern  . Not on file   Social History Narrative     ROS A 10 point review of system was performed. It is negative other than that mentioned in the history of present illness.   PHYSICAL EXAM   BP 128/62 mmHg  Pulse 79  Ht 5\' 2"  (1.575 m)  Wt 184 lb 12 oz (83.802 kg)  BMI 33.78 kg/m2 Constitutional: She is oriented to person, place, and time. She appears well-developed and well-nourished. No distress.  HENT: No nasal discharge.  Head: Normocephalic and atraumatic.  Eyes: Pupils are equal and round. No discharge.  Neck: Normal range of motion. Neck supple. No JVD present. No thyromegaly present.  Cardiovascular: Normal rate, regular rhythm, normal heart sounds. Exam reveals no gallop and no friction rub. No murmur heard.  Pulmonary/Chest: Effort normal and breath sounds normal. No stridor. No respiratory distress. She has no wheezes. She has no rales. She exhibits no tenderness.  Abdominal: Soft. Bowel sounds are normal. She exhibits no distension. There is no tenderness. There is no rebound and no guarding.  Musculoskeletal: Normal range of motion. She exhibits no edema and no tenderness.  Neurological: She is alert and oriented to person, place, and time. Coordination normal.  Skin: Skin is warm and dry. No rash noted. She is not diaphoretic. No erythema. No pallor.  Psychiatric: She has a normal mood and affect. Her behavior is normal. Judgment and thought content normal.       ASSESSMENT AND PLAN

## 2015-04-18 NOTE — Patient Instructions (Signed)
Medication Instructions:  Your physician recommends that you continue on your current medications as directed. Please refer to the Current Medication list given to you today.   Labwork: none  Testing/Procedures: none  Follow-Up: Your physician recommends that you schedule a follow-up appointment in: three months with Dr. Arida.    Any Other Special Instructions Will Be Listed Below (If Applicable).     If you need a refill on your cardiac medications before your next appointment, please call your pharmacy.   

## 2015-04-20 NOTE — Assessment & Plan Note (Signed)
Blood pressure is controlled with losartan which is well tolerated.

## 2015-04-20 NOTE — Assessment & Plan Note (Signed)
Nuclear stress test was overall low risk. Symptoms included exertional dyspnea without chest pain. I discussed with her the importance of controlling her risk factors to decrease the incidence of future cardiovascular events. I advised her systolic and exercise program to improve physical deconditioning.

## 2015-04-20 NOTE — Assessment & Plan Note (Signed)
Lab Results  Component Value Date   CHOL 216* 04/12/2015   HDL 51 04/12/2015   LDLCALC 131* 04/12/2015   LDLDIRECT 200.2 05/19/2013   TRIG 168* 04/12/2015   CHOLHDL 4.2 04/12/2015   Lipid profile improved with Zetia. Continue current treatment.

## 2015-04-22 ENCOUNTER — Ambulatory Visit (INDEPENDENT_AMBULATORY_CARE_PROVIDER_SITE_OTHER): Payer: Medicare Other | Admitting: Internal Medicine

## 2015-04-22 ENCOUNTER — Encounter: Payer: Self-pay | Admitting: Internal Medicine

## 2015-04-22 VITALS — BP 142/84 | HR 79 | Temp 98.3°F | Resp 14 | Ht 62.0 in | Wt 183.4 lb

## 2015-04-22 DIAGNOSIS — E785 Hyperlipidemia, unspecified: Secondary | ICD-10-CM

## 2015-04-22 DIAGNOSIS — I2510 Atherosclerotic heart disease of native coronary artery without angina pectoris: Secondary | ICD-10-CM | POA: Diagnosis not present

## 2015-04-22 DIAGNOSIS — M4806 Spinal stenosis, lumbar region: Secondary | ICD-10-CM

## 2015-04-22 DIAGNOSIS — J449 Chronic obstructive pulmonary disease, unspecified: Secondary | ICD-10-CM

## 2015-04-22 DIAGNOSIS — R7303 Prediabetes: Secondary | ICD-10-CM | POA: Diagnosis not present

## 2015-04-22 DIAGNOSIS — J45909 Unspecified asthma, uncomplicated: Secondary | ICD-10-CM

## 2015-04-22 DIAGNOSIS — I10 Essential (primary) hypertension: Secondary | ICD-10-CM | POA: Diagnosis not present

## 2015-04-22 DIAGNOSIS — E1121 Type 2 diabetes mellitus with diabetic nephropathy: Secondary | ICD-10-CM | POA: Diagnosis not present

## 2015-04-22 DIAGNOSIS — J4489 Other specified chronic obstructive pulmonary disease: Secondary | ICD-10-CM

## 2015-04-22 DIAGNOSIS — G4733 Obstructive sleep apnea (adult) (pediatric): Secondary | ICD-10-CM

## 2015-04-22 DIAGNOSIS — M48061 Spinal stenosis, lumbar region without neurogenic claudication: Secondary | ICD-10-CM

## 2015-04-22 DIAGNOSIS — E669 Obesity, unspecified: Secondary | ICD-10-CM

## 2015-04-22 LAB — BASIC METABOLIC PANEL
BUN: 12 mg/dL (ref 6–23)
CHLORIDE: 105 meq/L (ref 96–112)
CO2: 29 meq/L (ref 19–32)
CREATININE: 0.77 mg/dL (ref 0.40–1.20)
Calcium: 9.7 mg/dL (ref 8.4–10.5)
GFR: 78.9 mL/min (ref >60.00)
GLUCOSE: 103 mg/dL — AB (ref 70–99)
POTASSIUM: 4.5 meq/L (ref 3.5–5.1)
Sodium: 141 mEq/L (ref 135–145)

## 2015-04-22 LAB — MICROALBUMIN / CREATININE URINE RATIO
CREATININE, U: 37.8 mg/dL
Microalb Creat Ratio: 1.9 mg/g (ref 0.0–30.0)
Microalb, Ur: <0.7 mg/dL (ref 0.0–1.9)

## 2015-04-22 LAB — HEMOGLOBIN A1C: Hgb A1c MFr Bld: 6.2 % (ref 4.6–6.5)

## 2015-04-22 MED ORDER — CYCLOBENZAPRINE HCL 5 MG PO TABS: 5.0000 mg | ORAL_TABLET | Freq: Three times a day (TID) | ORAL | Status: DC | PRN

## 2015-04-22 MED ORDER — TRAMADOL HCL 50 MG PO TABS: 50.0000 mg | ORAL_TABLET | Freq: Four times a day (QID) | ORAL | Status: DC | PRN

## 2015-04-22 NOTE — Patient Instructions (Signed)
Your hot flashes are not likely from low blood sugars,  But you can check your level the next  Time  you have one to be sure  Balancing your sugar with a protein is key to prevent  the body from overcompensating with too much insulin release  Trial of flexeril for back spasms; ok to combine with tramadol

## 2015-04-22 NOTE — Progress Notes (Signed)
Pre visit review using our clinic review tool, if applicable. No additional management support is needed unless otherwise documented below in the visit note. 

## 2015-04-22 NOTE — Progress Notes (Signed)
Subjective:  Patient ID: Traci Hall, female    DOB: 1945-07-04  Age: 69 y.o. MRN: [id number]  CC: The primary encounter diagnosis was COPD. Diagnoses of Diabetic glomerulopathy ([place]), Essential hypertension, Controlled type 2 diabetes mellitus with diabetic nephropathy, without long-term current use of insulin ([place]), Hyperlipidemia, OSA (obstructive sleep apnea), Obesity, and Spinal stenosis at L4-L5 level were also pertinent to this visit.  HPI [facility] presents for follow up on multiple issues including Type 2 DM complicated by hyperlipidemia, CAD, and obesity.    She has not been engaging in regular exercise .  Reports that her efforts are continually hindered by  recurrent back spasms. Sees a Restaurant manager, fast food.  Belongs to [employer]   Has reduced caffeine to help reduce hot flashes which have started recurring early morning and during the day.   Discussed  participation in [employer], use of treadmill.     Plans to start exercising with husband Traci Hall daily .   Has noted that the recurrent episodes of blood in stool  Have ceased with suspension of meloxicam.     CAD: s/p noninvasive cardiac evaluation.  Now taking an aspirin  Daily along with zetia and losartan.     Outpatient Prescriptions Prior to Visit  Medication Sig Dispense Refill  . albuterol (PROVENTIL HFA;VENTOLIN HFA) 108 (90 BASE) MCG/ACT inhaler Inhale 2 puffs into the lungs daily. Pt uses 2 puff qam and prn throughout day. 2 Inhaler 3  . aspirin 81 MG tablet Take 81 mg by mouth daily.    . Calcium-Vitamin D-Vitamin K (CALCIUM SOFT CHEWS PO) Take by mouth daily.    . Cholecalciferol (VITAMIN D-3) 1000 UNITS CAPS Take 1 capsule by mouth daily.    Marland Kitchen desloratadine (CLARINEX) 5 MG tablet Take 1 tablet (5 mg total) by mouth daily. 90 tablet 3  . ezetimibe (ZETIA) 10 MG tablet Take 1 tablet (10 mg total) by mouth daily. 90 tablet 3  . famotidine (PEPCID) 40 MG tablet Take 40 mg by mouth daily.    .  Fluticasone Furoate-Vilanterol (BREO ELLIPTA) 100-25 MCG/INH AEPB Inhale 1 puff into the lungs daily. 90 each 2  . furosemide (LASIX) 20 MG tablet take 1 tablet by mouth once daily if needed 30 tablet 3  . hydrocortisone (ANUSOL-HC) 25 MG suppository Place 1 suppository (25 mg total) rectally 2 (two) times daily. 12 suppository 0  . losartan (COZAAR) 25 MG tablet Take 1 tablet (25 mg total) by mouth daily. 90 tablet 3  . montelukast (SINGULAIR) 10 MG tablet Take 1 tablet (10 mg total) by mouth at bedtime. 90 tablet 2  . Multiple Vitamins-Minerals (CENTRUM SILVER PO) Take by mouth. Take 1 am     . NEXIUM 40 MG capsule TAKE 1 CAPSULE DAILY BEFORE BREAKFAST 90 capsule 0  . olopatadine (PATANOL) 0.1 % ophthalmic solution Place 1 drop into both eyes as needed.     [name] Glycol-Propyl Glycol (SYSTANE OP) Apply to eye. 1-2 drops in each eye as needed.    . vitamin C (ASCORBIC ACID) 500 MG tablet Take 500 mg by mouth daily.    . vitamin E (VITAMIN E) 400 UNIT capsule Take 400 Units by mouth daily.    . Wheat Dextrin (BENEFIBER DRINK MIX PO) Take by mouth. 2 teaspoons mixed into drink daily    . zolpidem (AMBIEN) 10 MG tablet Take 1 tablet (10 mg total) by mouth at bedtime as needed. 30 tablet 3   No facility-administered medications prior to visit.  Review of Systems;  Patient denies headache, fevers, malaise, unintentional weight loss, skin rash, eye pain, sinus congestion and sinus pain, sore throat, dysphagia,  hemoptysis , cough, dyspnea, wheezing, chest pain, palpitations, orthopnea, edema, abdominal pain, nausea, melena, diarrhea, constipation, flank pain, dysuria, hematuria, urinary  Frequency, nocturia, numbness, tingling, seizures,  Focal weakness, Loss of consciousness,  Tremor, insomnia, depression, anxiety, and suicidal ideation.      Objective:  BP 142/84 mmHg  Pulse 79  Temp(Src) 98.3 F (36.8 C) (Oral)  Resp 14  Ht 5\' 2"  (1.575 m)  Wt 183 lb 6.4 oz (83.19 kg)  BMI 33.54  kg/m2  SpO2 98%  BP Readings from Last 3 Encounters:  04/22/15 142/84  04/18/15 128/62  03/01/15 158/84    Wt Readings from Last 3 Encounters:  04/22/15 183 lb 6.4 oz (83.19 kg)  04/18/15 184 lb 12 oz (83.802 kg)  03/01/15 185 lb 12 oz (84.256 kg)    General appearance: alert, cooperative and appears stated age Ears: normal TM's and external ear canals both ears Throat: lips, mucosa, and tongue normal; teeth and gums normal Neck: no adenopathy, no carotid bruit, supple, symmetrical, trachea midline and thyroid not enlarged, symmetric, no tenderness/mass/nodules Back: symmetric, no curvature. ROM normal. No CVA tenderness. Lungs: clear to auscultation bilaterally Heart: regular rate and rhythm, S1, S2 normal, no murmur, click, rub or gallop Abdomen: soft, non-tender; bowel sounds normal; no masses,  no organomegaly Pulses: 2+ and symmetric Skin: Skin color, texture, turgor normal. No rashes or lesions Lymph nodes: Cervical, supraclavicular, and axillary nodes normal.  Lab Results  Component Value Date   HGBA1C 6.2 04/22/2015   HGBA1C 6.2 09/21/2014   HGBA1C 6.2 03/07/2014    Lab Results  Component Value Date   CREATININE 0.77 04/22/2015   CREATININE 0.89 01/28/2015   CREATININE 0.87 09/21/2014    Lab Results  Component Value Date   WBC 8.2 11/23/2013   HGB 12.4 11/23/2013   HCT 36.8 11/23/2013   PLT 252.0 11/23/2013   GLUCOSE 103* 04/22/2015   CHOL 216* 04/12/2015   TRIG 168* 04/12/2015   HDL 51 04/12/2015   LDLDIRECT 200.2 05/19/2013   LDLCALC 131* 04/12/2015   ALT 69* 04/12/2015   AST 45* 04/12/2015   NA 141 04/22/2015   K 4.5 04/22/2015   CL 105 04/22/2015   CREATININE 0.77 04/22/2015   BUN 12 04/22/2015   CO2 29 04/22/2015   TSH 3.17 11/23/2013   HGBA1C 6.2 04/22/2015   MICROALBUR <0.7 04/22/2015    Nm Myocar Multi W/spect W/wall Motion / Ef  03/05/2015  Exercise myocardial perfusion imaging study with no significant  Ischemia on attenuation  corrected images Normal wall motion, EF estimated at 73% Baseline EKG with nonspecific ST abnormality. There was a >1 mm ST depression noted at peak stress in V4 to V6 that improved in recovery Low risk scan based on perfusion imaging. Signed, Esmond Plants, MD    Assessment & Plan:   Problem List Items Addressed This Visit    OSA (obstructive sleep apnea)    Diagnosed by sleep study. She is wearing her CPAP every night a minimum of 6 hours per night and notes improved daytime wakefulness and decreased fatigue          COPD - Primary    Symptoms are managed with LABA/LAMA , Singulair and prn albuterol        DM II (diabetes mellitus, type II), controlled ([place])    Diagnosed with fasting  glucose of 140  in May 2014. A1cs have been < 6.5  on diet alone and are being checked regularly. Patient is up-to-date on annual eye exams and has no neuropathy or  proteinuria  .  Fasting lipids are elevated; statin therapy has been prescribed in the past but not tolerated due to recurrent arthralgias. Currently tolerating Zetia, started after CT shoed coronary atherosclerosis.  Encouraged to continue healthy lifestyle  Including low GI diet, begin a regular exercise program, with goal wt loss of 20 lbs over next 6 months    Lab Results  Component Value Date   HGBA1C 6.2 04/22/2015   Lab Results  Component Value Date   MICROALBUR <0.7 04/22/2015   Lab Results  Component Value Date   CHOL 216* 04/12/2015   HDL 51 04/12/2015   LDLCALC 131* 04/12/2015   LDLDIRECT 200.2 05/19/2013   TRIG 168* 04/12/2015   CHOLHDL 4.2 04/12/2015                 Hyperlipidemia    She has been tolerating Zetia for LDL of 200 in the setting of DM and CAD by CT scan and has lowered her LDL by 30 pts.  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  Referral to Neurosurgery was advised but deferred, and she is currently limited with regard to exercising due to recurrent spasms.  Trial of flexeril,  Continue prn tramadol.          Obesity    I have addressed  BMI and recommended wt loss of 10% of body weight over the next 6 months using a low glycemic index diet and regular exercise a minimum of 5 days per week.        Essential hypertension    Well controlled on current regimen of losartan .and prn lasix for edema. Renal function stable, no changes today.  Lab Results  Component Value Date   CREATININE 0.77 04/22/2015   Lab Results  Component Value Date   NA 141 04/22/2015   K 4.5 04/22/2015   CL 105 04/22/2015   CO2 29 04/22/2015          Other Visit Diagnoses    Diabetic glomerulopathy ([place])         A total of 40 minutes was spent with patient more than half of which was spent in counseling patient on the above mentioned issues , reviewing and explaining recent labs and imaging studies done, and coordination of care.  I am having [facility]. [facility] start on cyclobenzaprine. I am also having her maintain her aspirin, olopatadine, Multiple Vitamins-Minerals (CENTRUM SILVER PO), vitamin C, vitamin E, zolpidem, Polyethyl Glycol-Propyl Glycol (SYSTANE OP), famotidine, Wheat Dextrin (BENEFIBER DRINK MIX PO), Vitamin D-3, hydrocortisone, albuterol, Fluticasone Furoate-Vilanterol, Calcium-Vitamin D-Vitamin K (CALCIUM SOFT CHEWS PO), montelukast, furosemide, desloratadine, NEXIUM, ezetimibe, losartan, and traMADol.  Meds ordered this encounter  Medications  . traMADol (ULTRAM) 50 MG tablet    Sig: Take 1 tablet (50 mg total) by mouth every 6 (six) hours as needed.    Dispense:  270 tablet  Refill:  2    90 day supply  [facility] TO [facility]  K  . cyclobenzaprine (FLEXERIL) 5 MG tablet    Sig: Take 1 tablet (5 mg total) by mouth 3 (three) times daily as needed for muscle spasms.    Dispense:  30 tablet    Refill:  1    Medications Discontinued During This Encounter  Medication Reason  . traMADol (ULTRAM) 50 MG tablet Reorder    Follow-up: Return in about 3 months (around 07/23/2015).   [name], MD

## 2015-04-23 ENCOUNTER — Encounter: Payer: Self-pay | Admitting: Internal Medicine

## 2015-04-23 NOTE — Assessment & Plan Note (Signed)
Symptoms are managed with LABA/LAMA , Singulair and prn albuterol

## 2015-04-23 NOTE — Assessment & Plan Note (Signed)
Well controlled on current regimen of losartan .and prn lasix for edema. Renal function stable, no changes today.  Lab Results  Component Value Date   CREATININE 0.77 04/22/2015   Lab Results  Component Value Date   NA 141 04/22/2015   K 4.5 04/22/2015   CL 105 04/22/2015   CO2 29 04/22/2015

## 2015-04-23 NOTE — Assessment & Plan Note (Signed)
Diagnosed by sleep study. She is wearing her CPAP every night a minimum of 6 hours per night and notes improved daytime wakefulness and decreased fatigue  

## 2015-04-23 NOTE — Assessment & Plan Note (Signed)
I have addressed  BMI and recommended wt loss of 10% of body weight over the next 6 months using a low glycemic index diet and regular exercise a minimum of 5 days per week.   

## 2015-04-23 NOTE — Assessment & Plan Note (Signed)
Diagnosed with fasting  glucose of 140 in May 2014. A1cs have been < 6.5  on diet alone and are being checked regularly. Patient is up-to-date on annual eye exams and has no neuropathy or  proteinuria  .  Fasting lipids are elevated; statin therapy has been prescribed in the past but not tolerated due to recurrent arthralgias. Currently tolerating Zetia, started after CT shoed coronary atherosclerosis.  Encouraged to continue healthy lifestyle  Including low GI diet, begin a regular exercise program, with goal wt loss of 20 lbs over next 6 months    Lab Results  Component Value Date   HGBA1C 6.2 04/22/2015   Lab Results  Component Value Date   MICROALBUR <0.7 04/22/2015   Lab Results  Component Value Date   CHOL 216* 04/12/2015   HDL 51 04/12/2015   LDLCALC 131* 04/12/2015   LDLDIRECT 200.2 05/19/2013   TRIG 168* 04/12/2015   CHOLHDL 4.2 04/12/2015

## 2015-04-23 NOTE — Assessment & Plan Note (Addendum)
She has been tolerating Zetia for LDL of 200 in the setting of DM and CAD by CT scan and has lowered her LDL by 30 pts.  LFTs are stable   Lab Results  Component Value Date   CHOL 216* 04/12/2015   HDL 51 04/12/2015   LDLCALC 131* 04/12/2015   LDLDIRECT 200.2 05/19/2013   TRIG 168* 04/12/2015   CHOLHDL 4.2 04/12/2015

## 2015-04-23 NOTE — Assessment & Plan Note (Signed)
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  Referral to Neurosurgery was advised but deferred, and she is currently limited with regard to exercising due to recurrent spasms.  Trial of flexeril,  Continue prn tramadol.

## 2015-06-06 ENCOUNTER — Ambulatory Visit: Payer: Medicare Other

## 2015-06-23 ENCOUNTER — Other Ambulatory Visit: Payer: Self-pay | Admitting: Internal Medicine

## 2015-06-28 ENCOUNTER — Other Ambulatory Visit: Payer: Self-pay

## 2015-06-28 MED ORDER — DESLORATADINE 5 MG PO TABS: 5.0000 mg | ORAL_TABLET | Freq: Every day | ORAL | Status: DC

## 2015-06-28 MED ORDER — MONTELUKAST SODIUM 10 MG PO TABS: 10.0000 mg | ORAL_TABLET | Freq: Every day | ORAL | Status: DC

## 2015-06-28 MED ORDER — ALBUTEROL SULFATE HFA 108 (90 BASE) MCG/ACT IN AERS: 2.0000 | INHALATION_SPRAY | Freq: Every day | RESPIRATORY_TRACT | Status: DC

## 2015-07-02 ENCOUNTER — Other Ambulatory Visit: Payer: Self-pay

## 2015-07-02 MED ORDER — ALBUTEROL SULFATE HFA 108 (90 BASE) MCG/ACT IN AERS: 2.0000 | INHALATION_SPRAY | Freq: Every day | RESPIRATORY_TRACT | Status: DC

## 2015-07-02 MED ORDER — DESLORATADINE 5 MG PO TABS: 5.0000 mg | ORAL_TABLET | Freq: Every day | ORAL | Status: DC

## 2015-07-05 ENCOUNTER — Other Ambulatory Visit: Payer: Self-pay

## 2015-07-05 ENCOUNTER — Telehealth: Payer: Self-pay | Admitting: Internal Medicine

## 2015-07-05 MED ORDER — FLUTICASONE FUROATE-VILANTEROL 100-25 MCG/INH IN AEPB: 1.0000 | INHALATION_SPRAY | Freq: Every day | RESPIRATORY_TRACT | Status: DC

## 2015-07-05 NOTE — Telephone Encounter (Signed)
Pt called about a prescription that needs to be approved. Medication is Fluticasone Furoate-Vilanterol (BREO ELLIPTA) 100-25 MCG/INH AEPB. Pharmacy is [facility], [place]. Call pt @ [phone]. Thank You!

## 2015-07-05 NOTE — Telephone Encounter (Signed)
Refill request has been filled

## 2015-08-01 ENCOUNTER — Ambulatory Visit (INDEPENDENT_AMBULATORY_CARE_PROVIDER_SITE_OTHER): Payer: Medicare Other | Admitting: Cardiovascular Disease

## 2015-08-01 ENCOUNTER — Encounter: Payer: Self-pay | Admitting: Cardiovascular Disease

## 2015-08-01 VITALS — BP 140/84 | HR 78 | Ht 62.0 in | Wt 183.0 lb

## 2015-08-01 DIAGNOSIS — I10 Essential (primary) hypertension: Secondary | ICD-10-CM

## 2015-08-01 DIAGNOSIS — I2510 Atherosclerotic heart disease of native coronary artery without angina pectoris: Secondary | ICD-10-CM

## 2015-08-01 DIAGNOSIS — E785 Hyperlipidemia, unspecified: Secondary | ICD-10-CM | POA: Diagnosis not present

## 2015-08-01 MED ORDER — LOSARTAN POTASSIUM 50 MG PO TABS: 50.0000 mg | ORAL_TABLET | Freq: Every day | ORAL | Status: DC

## 2015-08-01 NOTE — Assessment & Plan Note (Signed)
Nuclear stress test was overall low risk last year.  She is doing well overall with no clear symptoms of angina. Continue medical therapy. Unfortunately, she is intolerant to statins and also did not tolerate Zetia.

## 2015-08-01 NOTE — Assessment & Plan Note (Signed)
Blood pressure is elevated. I increased the dose of losartan to 50 mg once daily.

## 2015-08-01 NOTE — Assessment & Plan Note (Signed)
Unfortunately, she is intolerant to statins and most recently Zetia. There is currently no indication for treatment with PCSK 9 inhibitor. Continue with lifestyle changes.

## 2015-08-01 NOTE — Patient Instructions (Signed)
Medication Instructions:  Your physician has recommended you make the following change in your medication:  INCREASE losartan to 50mg  once daily   Labwork: none  Testing/Procedures: none  Follow-Up: Your physician wants you to follow-up in: one year with Dr. Fletcher Anon. You will receive a reminder letter in the mail two months in advance. If you don't receive a letter, please call our office to schedule the follow-up appointment.   Any Other Special Instructions Will Be Listed Below (If Applicable).     If you need a refill on your cardiac medications before your next appointment, please call your pharmacy.

## 2015-08-01 NOTE — Progress Notes (Signed)
Primary care physician: Dr. Derrel Nip  HPI  This is a pleasant 70 year old female  who is here today for a follow-up visit regarding  coronary atherosclerosis noted on CT scan of the chest.  She has known history of diet-controlled diabetes, previous tobacco use with mild COPD with an asthmatic component, essential hypertension, GERD, hyperlipidemia with intolerance to statins and obesity. She quit smoking in 2007. She has no family history of premature coronary artery disease.  Her symptoms included exertional dyspnea without chest pain.  She is known to have hyperlipidemia with intolerance to statins including small dose rosuvastatin. Exercise nuclear stress test in September 2016 was low risk. She did have mild ischemic EKG changes with exercise but perfusion was normal and ejection fraction was greater than 65%. Lisinopril was switched to losartan due to cough. She took Zetia for few months but then developed joint pain and had to stop the medication.  She is doing reasonably well and denies any chest pain or significant dyspnea. Blood pressure has been running high at home.   Allergies  Allergen Reactions  . Alendronate Sodium     Leg cramps *FOSAMAX*  . Demerol     Over sensitivity  . Naproxen     Leg cramps an swelling    . Statins     Cramps in legs      Current Outpatient Prescriptions on File Prior to Visit  Medication Sig Dispense Refill  . albuterol (PROVENTIL HFA;VENTOLIN HFA) 108 (90 Base) MCG/ACT inhaler Inhale 2 puffs into the lungs daily. Pt uses 2 puff qam and prn throughout day. 2 Inhaler 3  . aspirin 81 MG tablet Take 81 mg by mouth daily.    . Calcium-Vitamin D-Vitamin K (CALCIUM SOFT CHEWS PO) Take by mouth daily.    . Cholecalciferol (VITAMIN D-3) 1000 UNITS CAPS Take 1 capsule by mouth daily.    . cyclobenzaprine (FLEXERIL) 5 MG tablet Take 1 tablet (5 mg total) by mouth 3 (three) times daily as needed for muscle spasms. 30 tablet 1  . desloratadine (CLARINEX) 5  MG tablet Take 1 tablet (5 mg total) by mouth daily. 90 tablet 3  . famotidine (PEPCID) 40 MG tablet Take 40 mg by mouth daily.    . Fluticasone Furoate-Vilanterol (BREO ELLIPTA) 100-25 MCG/INH AEPB Inhale 1 puff into the lungs daily. 90 each 2  . furosemide (LASIX) 20 MG tablet take 1 tablet by mouth once daily if needed 30 tablet 3  . hydrocortisone (ANUSOL-HC) 25 MG suppository Place 1 suppository (25 mg total) rectally 2 (two) times daily. 12 suppository 0  . losartan (COZAAR) 25 MG tablet Take 1 tablet (25 mg total) by mouth daily. 90 tablet 3  . montelukast (SINGULAIR) 10 MG tablet Take 1 tablet (10 mg total) by mouth at bedtime. 90 tablet 2  . Multiple Vitamins-Minerals (CENTRUM SILVER PO) Take by mouth. Take 1 am     . NEXIUM 40 MG capsule TAKE 1 CAPSULE DAILY BEFORE BREAKFAST 90 capsule 1  . olopatadine (PATANOL) 0.1 % ophthalmic solution Place 1 drop into both eyes as needed.     [name] Glycol-Propyl Glycol (SYSTANE OP) Apply to eye. 1-2 drops in each eye as needed.    . traMADol (ULTRAM) 50 MG tablet Take 1 tablet (50 mg total) by mouth every 6 (six) hours as needed. 270 tablet 2  . vitamin C (ASCORBIC ACID) 500 MG tablet Take 500 mg by mouth daily.    . vitamin E (VITAMIN E) 400 UNIT capsule Take  400 Units by mouth daily.    . Wheat Dextrin (BENEFIBER DRINK MIX PO) Take by mouth. 2 teaspoons mixed into drink daily    . zolpidem (AMBIEN) 10 MG tablet Take 1 tablet (10 mg total) by mouth at bedtime as needed. 30 tablet 3   No current facility-administered medications on file prior to visit.     Past Medical History  Diagnosis Date  . Asthma   . Allergic rhinitis   . OSA on CPAP   . COPD (chronic obstructive pulmonary disease) ([place])     by prior PFTs  . Hyperlipidemia   . Personal history of tobacco use, presenting hazards to health 01/08/2015  . Hypertension      Past Surgical History  Procedure Laterality Date  . Nasal sinus surgery  2008  . Appendectomy  1977  .  Abdominal hysterectomy  1991    secondary to endometriosis, and polyps     Family History  Problem Relation Age of Onset  . Heart disease Father     valvular cardiomyopathy,  mitral valve  . COPD Mother   . Hypertension Mother   . Cancer Neg Hx      Social History   Social History  . Marital Status: Married    Spouse Name: N/A  . Number of Children: 2  . Years of Education: N/A   Occupational History  . retired     Social History Main Topics  . Smoking status: Former Smoker -- 1.50 packs/day for 45 years    Types: Cigarettes    Quit date: 06/15/2005  . Smokeless tobacco: Never Used  . Alcohol Use: Yes  . Drug Use: No  . Sexual Activity: Not on file   Other Topics Concern  . Not on file   Social History Narrative     ROS A 10 point review of system was performed. It is negative other than that mentioned in the history of present illness.   PHYSICAL EXAM   BP 140/84 mmHg  Pulse 78  Ht 5\' 2"  (1.575 m)  Wt 183 lb (83.008 kg)  BMI 33.46 kg/m2  SpO2 99% Constitutional: She is oriented to person, place, and time. She appears well-developed and well-nourished. No distress.  HENT: No nasal discharge.  Head: Normocephalic and atraumatic.  Eyes: Pupils are equal and round. No discharge.  Neck: Normal range of motion. Neck supple. No JVD present. No thyromegaly present.  Cardiovascular: Normal rate, regular rhythm, normal heart sounds. Exam reveals no gallop and no friction rub. No murmur heard.  Pulmonary/Chest: Effort normal and breath sounds normal. No stridor. No respiratory distress. She has no wheezes. She has no rales. She exhibits no tenderness.  Abdominal: Soft. Bowel sounds are normal. She exhibits no distension. There is no tenderness. There is no rebound and no guarding.  Musculoskeletal: Normal range of motion. She exhibits no edema and no tenderness.  Neurological: She is alert and oriented to person, place, and time. Coordination normal.  Skin: Skin  is warm and dry. No rash noted. She is not diaphoretic. No erythema. No pallor.  Psychiatric: She has a normal mood and affect. Her behavior is normal. Judgment and thought content normal.       ASSESSMENT AND PLAN

## 2015-08-08 ENCOUNTER — Encounter: Payer: Self-pay | Admitting: Internal Medicine

## 2015-08-08 ENCOUNTER — Ambulatory Visit (INDEPENDENT_AMBULATORY_CARE_PROVIDER_SITE_OTHER): Payer: Medicare Other | Admitting: Internal Medicine

## 2015-08-08 VITALS — BP 110/78 | HR 75 | Temp 98.0°F | Resp 12 | Ht 62.0 in | Wt 185.5 lb

## 2015-08-08 DIAGNOSIS — F411 Generalized anxiety disorder: Secondary | ICD-10-CM

## 2015-08-08 DIAGNOSIS — I2510 Atherosclerotic heart disease of native coronary artery without angina pectoris: Secondary | ICD-10-CM | POA: Diagnosis not present

## 2015-08-08 DIAGNOSIS — Z23 Encounter for immunization: Secondary | ICD-10-CM

## 2015-08-08 DIAGNOSIS — M48061 Spinal stenosis, lumbar region without neurogenic claudication: Secondary | ICD-10-CM

## 2015-08-08 DIAGNOSIS — K760 Fatty (change of) liver, not elsewhere classified: Secondary | ICD-10-CM | POA: Diagnosis not present

## 2015-08-08 DIAGNOSIS — E669 Obesity, unspecified: Secondary | ICD-10-CM

## 2015-08-08 DIAGNOSIS — I10 Essential (primary) hypertension: Secondary | ICD-10-CM

## 2015-08-08 DIAGNOSIS — M4806 Spinal stenosis, lumbar region: Secondary | ICD-10-CM

## 2015-08-08 DIAGNOSIS — E785 Hyperlipidemia, unspecified: Secondary | ICD-10-CM

## 2015-08-08 DIAGNOSIS — Z7289 Other problems related to lifestyle: Secondary | ICD-10-CM | POA: Diagnosis not present

## 2015-08-08 LAB — COMPREHENSIVE METABOLIC PANEL
ALK PHOS: 79 U/L (ref 39–117)
ALT: 68 U/L — AB (ref 0–35)
AST: 49 U/L — AB (ref 0–37)
Albumin: 4.3 g/dL (ref 3.5–5.2)
BILIRUBIN TOTAL: 0.5 mg/dL (ref 0.2–1.2)
BUN: 13 mg/dL (ref 6–23)
CO2: 31 mEq/L (ref 19–32)
Calcium: 9.4 mg/dL (ref 8.4–10.5)
Chloride: 103 mEq/L (ref 96–112)
Creatinine, Ser: 0.75 mg/dL (ref 0.40–1.20)
GFR: 81.27 mL/min (ref >60.00)
GLUCOSE: 122 mg/dL — AB (ref 70–99)
POTASSIUM: 4.3 meq/L (ref 3.5–5.1)
SODIUM: 139 meq/L (ref 135–145)
TOTAL PROTEIN: 6.9 g/dL (ref 6.0–8.3)

## 2015-08-08 LAB — LIPID PANEL
Cholesterol: 260 mg/dL — ABNORMAL HIGH (ref 0–200)
HDL: 57.2 mg/dL (ref >39.00)
LDL Cholesterol: 172 mg/dL — ABNORMAL HIGH (ref 0–99)
NONHDL: 202.77
Total CHOL/HDL Ratio: 5
Triglycerides: 154 mg/dL — ABNORMAL HIGH (ref 0.0–149.0)
VLDL: 30.8 mg/dL (ref 0.0–40.0)
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
## 2015-08-08 NOTE — Patient Instructions (Signed)
We've got  some substitutions for your potatoes!!  Try the mashed cauliflower and riced cauliflower dishes by [employer]  instead of rice and mashed potatoes  Mashed turnips are also very low carb!   Try [employer] Triple Zero [place] Yogurt in the salted caramel, and the coffee flavors  With Whipped Cream for dessert  [employer] now makes a frozen breakfast frittata that can be microwaved in 2 minutes and is very low carb. Frittats are similar to quiches without the crust  .

## 2015-08-08 NOTE — Progress Notes (Signed)
Pre-visit discussion using our clinic review tool. No additional management support is needed unless otherwise documented below in the visit note.  

## 2015-08-09 LAB — HEPATITIS C ANTIBODY: HCV Ab: NEGATIVE

## 2015-08-10 DIAGNOSIS — F411 Generalized anxiety disorder: Secondary | ICD-10-CM | POA: Insufficient documentation

## 2015-08-10 NOTE — Assessment & Plan Note (Signed)
Well controlled on current regimen. Renal function stable, no changes today.  Lab Results  Component Value Date   CREATININE 0.75 08/08/2015   Lab Results  Component Value Date   NA 139 08/08/2015   K 4.3 08/08/2015   CL 103 08/08/2015   CO2 31 08/08/2015

## 2015-08-10 NOTE — Assessment & Plan Note (Signed)
Diagnosed with fasting  glucose of 140 in May 2014. A1cs have been < 6.5  on diet alone and are being checked regularly. Patient is up-to-date on annual eye exams and has no neuropathy or  proteinuria  .  Fasting lipids are elevated; statin therapy has been prescribed in the past but not tolerated due to recurrent arthralgias. Currently tolerating Zetia for management of coronary atherosclerosis.  Encouraged to continue healthy lifestyle  Including low GI diet,  regular exercise program, with goal wt loss of 20 lbs over next 6 months    Lab Results  Component Value Date   HGBA1C 6.2 04/22/2015   Lab Results  Component Value Date   MICROALBUR <0.7 04/22/2015   Lab Results  Component Value Date   CHOL 260* 08/08/2015   HDL 57.20 08/08/2015   LDLCALC 172* 08/08/2015   LDLDIRECT 200.2 05/19/2013   TRIG 154.0* 08/08/2015   CHOLHDL 5 08/08/2015

## 2015-08-10 NOTE — Assessment & Plan Note (Signed)
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  She is deferring Neurosurgery evaluation and using alternative therapies including manipulation and acupuncture to manage her pain

## 2015-08-10 NOTE — Assessment & Plan Note (Addendum)
Triggered by caregiver responsibilities of husband with dementia.   She has deferred pharmacotherapy. Community resources for patients with dementia discussed.

## 2015-08-10 NOTE — Assessment & Plan Note (Signed)
Diet and exercises addressed, BMI goals addressed.   recommended wt loss of 10% of body weight over the next 6 months using a low glycemic index diet and regular exercise a minimum of 5 days per week.

## 2015-08-10 NOTE — Assessment & Plan Note (Addendum)
Lipids are elevated again,  It appears she may have stopped the Zetia.   Lab Results  Component Value Date   CHOL 260* 08/08/2015   HDL 57.20 08/08/2015   LDLCALC 172* 08/08/2015   LDLDIRECT 200.2 05/19/2013   TRIG 154.0* 08/08/2015   CHOLHDL 5 08/08/2015

## 2015-08-11 ENCOUNTER — Encounter: Payer: Self-pay | Admitting: Internal Medicine

## 2015-08-26 ENCOUNTER — Other Ambulatory Visit: Payer: Self-pay

## 2015-08-26 MED ORDER — ALBUTEROL SULFATE HFA 108 (90 BASE) MCG/ACT IN AERS: 2.0000 | INHALATION_SPRAY | Freq: Four times a day (QID) | RESPIRATORY_TRACT | Status: DC | PRN

## 2015-08-27 DIAGNOSIS — Z1283 Encounter for screening for malignant neoplasm of skin: Secondary | ICD-10-CM | POA: Diagnosis not present

## 2015-08-27 DIAGNOSIS — L859 Epidermal thickening, unspecified: Secondary | ICD-10-CM | POA: Diagnosis not present

## 2015-08-27 DIAGNOSIS — Z85828 Personal history of other malignant neoplasm of skin: Secondary | ICD-10-CM | POA: Diagnosis not present

## 2015-08-27 DIAGNOSIS — D1800 Hemangioma unspecified site: Secondary | ICD-10-CM | POA: Diagnosis not present

## 2015-08-27 DIAGNOSIS — D485 Neoplasm of uncertain behavior of skin: Secondary | ICD-10-CM | POA: Diagnosis not present

## 2015-08-27 DIAGNOSIS — L820 Inflamed seborrheic keratosis: Secondary | ICD-10-CM | POA: Diagnosis not present

## 2015-08-27 DIAGNOSIS — D229 Melanocytic nevi, unspecified: Secondary | ICD-10-CM | POA: Diagnosis not present

## 2015-08-27 DIAGNOSIS — L821 Other seborrheic keratosis: Secondary | ICD-10-CM | POA: Diagnosis not present

## 2015-08-27 DIAGNOSIS — C44311 Basal cell carcinoma of skin of nose: Secondary | ICD-10-CM | POA: Diagnosis not present

## 2015-09-03 ENCOUNTER — Other Ambulatory Visit: Payer: Self-pay

## 2015-09-03 NOTE — Telephone Encounter (Signed)
ERROR

## 2015-09-10 DIAGNOSIS — H40053 Ocular hypertension, bilateral: Secondary | ICD-10-CM | POA: Diagnosis not present

## 2015-09-10 LAB — HM DIABETES EYE EXAM

## 2015-11-27 ENCOUNTER — Ambulatory Visit (INDEPENDENT_AMBULATORY_CARE_PROVIDER_SITE_OTHER): Payer: Medicare Other | Admitting: Internal Medicine

## 2015-11-27 VITALS — BP 136/88 | HR 80 | Temp 98.2°F | Ht 62.0 in | Wt 182.5 lb

## 2015-11-27 DIAGNOSIS — J449 Chronic obstructive pulmonary disease, unspecified: Secondary | ICD-10-CM | POA: Diagnosis not present

## 2015-11-27 DIAGNOSIS — Z0000 Encounter for general adult medical examination without abnormal findings: Secondary | ICD-10-CM

## 2015-11-27 DIAGNOSIS — E119 Type 2 diabetes mellitus without complications: Secondary | ICD-10-CM | POA: Diagnosis not present

## 2015-11-27 DIAGNOSIS — Z1239 Encounter for other screening for malignant neoplasm of breast: Secondary | ICD-10-CM

## 2015-11-27 DIAGNOSIS — E785 Hyperlipidemia, unspecified: Secondary | ICD-10-CM

## 2015-11-27 DIAGNOSIS — R61 Generalized hyperhidrosis: Secondary | ICD-10-CM | POA: Diagnosis not present

## 2015-11-27 DIAGNOSIS — J4489 Other specified chronic obstructive pulmonary disease: Secondary | ICD-10-CM

## 2015-11-27 DIAGNOSIS — J45909 Unspecified asthma, uncomplicated: Secondary | ICD-10-CM

## 2015-11-27 LAB — LDL CHOLESTEROL, DIRECT: Direct LDL: 169 mg/dL

## 2015-11-27 LAB — CBC WITH DIFFERENTIAL/PLATELET
Basophils Absolute: 0 10*3/uL (ref 0.0–0.1)
Basophils Relative: 0.6 % (ref 0.0–3.0)
Eosinophils Absolute: 0.6 10*3/uL (ref 0.0–0.7)
Eosinophils Relative: 7.7 % — ABNORMAL HIGH (ref 0.0–5.0)
HCT: 39.1 % (ref 36.0–46.0)
Hemoglobin: 12.9 g/dL (ref 12.0–15.0)
Lymphocytes Relative: 34.7 % (ref 12.0–46.0)
Lymphs Abs: 2.8 10*3/uL (ref 0.7–4.0)
MCHC: 33 g/dL (ref 30.0–36.0)
MCV: 89.6 fl (ref 78.0–100.0)
Monocytes Absolute: 0.8 10*3/uL (ref 0.1–1.0)
Monocytes Relative: 9.7 % (ref 3.0–12.0)
Neutro Abs: 3.8 10*3/uL (ref 1.4–7.7)
Neutrophils Relative %: 47.3 % (ref 43.0–77.0)
Platelets: 279 10*3/uL (ref 150.0–400.0)
RBC: 4.36 Mil/uL (ref 3.87–5.11)
RDW: 13.6 % (ref 11.5–15.5)
WBC: 8 10*3/uL (ref 4.0–10.5)

## 2015-11-27 LAB — LIPID PANEL
CHOL/HDL RATIO: 5
CHOLESTEROL: 234 mg/dL — AB (ref 0–200)
HDL: 45.3 mg/dL (ref >39.00)
LDL CALC: 153 mg/dL — AB (ref 0–99)
NonHDL: 188.63
TRIGLYCERIDES: 178 mg/dL — AB (ref 0.0–149.0)
VLDL: 35.6 mg/dL (ref 0.0–40.0)

## 2015-11-27 LAB — COMPREHENSIVE METABOLIC PANEL
ALK PHOS: 81 U/L (ref 39–117)
ALT: 51 U/L — AB (ref 0–35)
AST: 36 U/L (ref 0–37)
Albumin: 4.2 g/dL (ref 3.5–5.2)
BUN: 15 mg/dL (ref 6–23)
CO2: 27 meq/L (ref 19–32)
Calcium: 9.6 mg/dL (ref 8.4–10.5)
Chloride: 101 mEq/L (ref 96–112)
Creatinine, Ser: 0.85 mg/dL (ref 0.40–1.20)
GFR: 70.28 mL/min (ref >60.00)
GLUCOSE: 112 mg/dL — AB (ref 70–99)
POTASSIUM: 4.5 meq/L (ref 3.5–5.1)
SODIUM: 137 meq/L (ref 135–145)
TOTAL PROTEIN: 7.1 g/dL (ref 6.0–8.3)
Total Bilirubin: 0.5 mg/dL (ref 0.2–1.2)

## 2015-11-27 LAB — HEMOGLOBIN A1C: Hgb A1c MFr Bld: 6.3 % (ref 4.6–6.5)

## 2015-11-27 LAB — MICROALBUMIN / CREATININE URINE RATIO
Creatinine,U: 36.2 mg/dL
Microalb Creat Ratio: 1.9 mg/g (ref 0.0–30.0)
Microalb, Ur: <0.7 mg/dL (ref 0.0–1.9)

## 2015-11-27 MED ORDER — ALBUTEROL SULFATE HFA 108 (90 BASE) MCG/ACT IN AERS: INHALATION_SPRAY | RESPIRATORY_TRACT | Status: DC

## 2015-11-27 MED ORDER — PANTOPRAZOLE SODIUM 40 MG PO TBEC: 40.0000 mg | DELAYED_RELEASE_TABLET | Freq: Every day | ORAL | Status: DC

## 2015-11-27 NOTE — Progress Notes (Signed)
Pre-visit discussion using our clinic review tool. No additional management support is needed unless otherwise documented below in the visit note.  

## 2015-11-27 NOTE — Progress Notes (Signed)
Patient ID: Traci Hall, female    DOB: 09/05/1945  Age: 70 y.o. MRN: [id number]  The patient is here for annual Medicare wellness examination and management of other chronic and acute problems.    The risk factors are reflected in the social history.  The roster of all physicians providing medical care to patient - is listed in the Snapshot section of the chart.  Activities of daily living:  The patient is 100% independent in all ADLs: dressing, toileting, feeding as well as independent mobility  Home safety : The patient has smoke detectors in the home. They wear seatbelts.  There are no firearms at home. There is no violence in the home.   There is no risks for hepatitis, STDs or HIV. There is no   history of blood transfusion. They have no travel history to infectious disease endemic areas of the world.  The patient has seen their dentist in the last six month. They have seen their eye doctor in the last year. They admit to slight hearing difficulty with regard to whispered voices and some television programs.  They have deferred audiologic testing in the last year.  They do not  have excessive sun exposure. Discussed the need for sun protection: hats, long sleeves and use of sunscreen if there is significant sun exposure.   Diet: the importance of a healthy diet is discussed. They do have a healthy diet. Except when travelling , follows a lower carb diet   The benefits of regular aerobic exercise were discussed. She is not exercising due to back pain .   Depression screen: there are no signs or vegative symptoms of depression- irritability, change in appetite, anhedonia, sadness/tearfullness.  Cognitive assessment: the patient manages all their financial and personal affairs and is actively engaged. They could relate day,date,year and events; recalled 2/3 objects at 3 minutes; performed clock-face test normally.  The following portions of the patient's history were reviewed and  updated as appropriate: allergies, current medications, past family history, past medical history,  past surgical history, past social history  and problem list.  Visual acuity was not assessed per patient preference since she has regular follow up with her ophthalmologist. Hearing and body mass index were assessed and reviewed.   During the course of the visit the patient was educated and counseled about appropriate screening and preventive services including : fall prevention , diabetes screening, nutrition counseling, colorectal cancer screening, and recommended immunizations.    CC: The primary encounter diagnosis was COPD. Diagnoses of Controlled type 2 diabetes mellitus without complication, unspecified long term insulin use status ([place]), Night sweats, Diabetes mellitus without complication ([place]), Hyperlipidemia, Breast cancer screening, and Medicare annual wellness visit, subsequent were also pertinent to this visit.   Receiving acupuncture from chiropractor every 2 weeks by Dr. 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 Uses 2 puffs daily  No longer covering nexium unless PA and medical necessity,   trying 30 day of protonixi and qhs famotidine  Previous attempts to switch PPI to Prevacid 30 mg and zantac  resulted in severe esophagitis   History Traci Hall has a past medical history of Asthma; Allergic rhinitis; OSA on CPAP; COPD (chronic obstructive pulmonary disease) ([place]); Hyperlipidemia; Personal history of tobacco use, presenting hazards to health (01/08/2015); and Hypertension.   She has past surgical history that includes Nasal sinus surgery (2008); Appendectomy (1977); and Abdominal  hysterectomy (1991).   Her family history includes COPD in her mother; Heart disease in her father; Hypertension in her mother. There is no history of Cancer.She reports that she quit smoking about 10 years ago.  Her smoking use included Cigarettes. She has a 67.5 pack-year smoking history. She has never used smokeless tobacco. She reports that she drinks alcohol. She reports that she does not use illicit drugs.  Outpatient Prescriptions Prior to Visit  Medication Sig Dispense Refill  . albuterol (PROVENTIL HFA;VENTOLIN HFA) 108 (90 Base) MCG/ACT inhaler Inhale 2 puffs into the lungs every 6 (six) hours as needed for wheezing or shortness of breath. 1 Inhaler 2  . aspirin 81 MG tablet Take 81 mg by mouth daily.    . Calcium-Vitamin D-Vitamin K (CALCIUM SOFT CHEWS PO) Take by mouth daily.    . Cholecalciferol (VITAMIN D-3) 1000 UNITS CAPS Take 1 capsule by mouth daily.    . cyclobenzaprine (FLEXERIL) 5 MG tablet Take 1 tablet (5 mg total) by mouth 3 (three) times daily as needed for muscle spasms. 30 tablet 1  . desloratadine (CLARINEX) 5 MG tablet Take 1 tablet (5 mg total) by mouth daily. 90 tablet 3  . famotidine (PEPCID) 40 MG tablet Take 40 mg by mouth daily.    . Fluticasone Furoate-Vilanterol (BREO ELLIPTA) 100-25 MCG/INH AEPB Inhale 1 puff into the lungs daily. 90 each 2  . furosemide (LASIX) 20 MG tablet take 1 tablet by mouth once daily if needed 30 tablet 3  . hydrocortisone (ANUSOL-HC) 25 MG suppository Place 1 suppository (25 mg total) rectally 2 (two) times daily. 12 suppository 0  . losartan (COZAAR) 50 MG tablet Take 1 tablet (50 mg total) by mouth daily. 90 tablet 3  . montelukast (SINGULAIR) 10 MG tablet Take 1 tablet (10 mg total) by mouth at bedtime. 90 tablet 2  . Multiple Vitamins-Minerals (CENTRUM SILVER PO) Take by mouth. Take 1 am     . olopatadine (PATANOL) 0.1 % ophthalmic solution Place 1 drop into both eyes as needed.     [name] Glycol-Propyl Glycol (SYSTANE OP) Apply to eye. 1-2 drops in each eye as needed.    . traMADol (ULTRAM) 50 MG tablet Take 1 tablet (50 mg total) by mouth every 6 (six) hours as needed. 270 tablet 2  . vitamin C (ASCORBIC ACID) 500 MG tablet Take  500 mg by mouth daily.    . vitamin E (VITAMIN E) 400 UNIT capsule Take 400 Units by mouth daily.    . Wheat Dextrin (BENEFIBER DRINK MIX PO) Take by mouth. 2 teaspoons mixed into drink daily    . zolpidem (AMBIEN) 10 MG tablet Take 1 tablet (10 mg total) by mouth at bedtime as needed. 30 tablet 3  . albuterol (PROVENTIL HFA;VENTOLIN HFA) 108 (90 Base) MCG/ACT inhaler Inhale 2 puffs into the lungs daily. Pt uses 2 puff qam and prn throughout day. 2 Inhaler 3  . NEXIUM 40 MG capsule TAKE 1 CAPSULE DAILY BEFORE BREAKFAST 90 capsule 1   No facility-administered medications prior to visit.    Review of Systems   Patient denies headache, fevers, malaise, unintentional weight loss, skin rash, eye pain, sinus congestion and sinus pain, sore throat, dysphagia,  hemoptysis , cough, dyspnea, wheezing, chest pain, palpitations, orthopnea, edema, abdominal pain, nausea, melena, diarrhea, constipation, flank pain, dysuria, hematuria, urinary  Frequency, nocturia, numbness, tingling, seizures,  Focal weakness, Loss of consciousness,  Tremor, insomnia, depression, anxiety, and suicidal ideation.     Objective:  BP 136/88 mmHg  Pulse 80  Temp(Src) 98.2 F (36.8 C) (Oral)  Ht 5\' 2"  (1.575 m)  Wt 182 lb 8 oz (82.781 kg)  BMI 33.37 kg/m2  SpO2 97%  Physical Exam   General appearance: alert, cooperative and appears stated age Head: Normocephalic, without obvious abnormality, atraumatic Eyes: conjunctivae/corneas clear. PERRL, EOM's intact. Fundi benign. Ears: normal TM's and external ear canals both ears Nose: Nares normal. Septum midline. Mucosa normal. No drainage or sinus tenderness. Throat: lips, mucosa, and tongue normal; teeth and gums normal Neck: no adenopathy, no carotid bruit, no JVD, supple, symmetrical, trachea midline and thyroid not enlarged, symmetric, no tenderness/mass/nodules Lungs: clear to auscultation bilaterally Breasts: normal appearance, no masses or tenderness Heart: regular  rate and rhythm, S1, S2 normal, no murmur, click, rub or gallop Abdomen: soft, non-tender; bowel sounds normal; no masses,  no organomegaly Extremities: extremities normal, atraumatic, no cyanosis or edema Pulses: 2+ and symmetric Skin: Skin color, texture, turgor normal. No rashes or lesions Neurologic: Alert and oriented X 3, normal strength and tone. Normal symmetric reflexes. Normal coordination and gait.     Assessment & Plan:   Problem List Items Addressed This Visit    Medicare annual wellness visit, subsequent    Annual Medicare wellness  exam was done as well as a comprehensive physical exam and management of acute and chronic conditions .  During the course of the visit the patient was educated and counseled about appropriate screening and preventive services including : fall prevention , diabetes screening, nutrition counseling, colorectal cancer screening, and recommended immunizations.  Printed recommendations for health maintenance screenings was given.       DM II (diabetes mellitus, type II), controlled ([place])    Diagnosed with fasting  glucose of 140 in May 2014. A1cs have been < 6.5  on diet alone and are being checked regularly. Patient is up-to-date on annual eye exams and has no neuropathy or  proteinuria  .  Fasting lipids are elevated; statin therapy has been prescribed in the past but not tolerated due to recurrent arthralgias. Currently tolerating Zetia for management of coronary atherosclerosis.  Encouraged to continue healthy lifestyle  Including low GI diet,  regular exercise program, with goal wt loss of 20 lbs over next 6 months    Lab Results  Component Value Date   HGBA1C 6.3 11/27/2015   Lab Results  Component Value Date   MICROALBUR <0.7 11/27/2015   Lab Results  Component Value Date   CHOL 234* 11/27/2015   HDL 45.30 11/27/2015   LDLCALC 153* 11/27/2015   LDLDIRECT 169.0 11/27/2015   TRIG 178.0* 11/27/2015   CHOLHDL 5 11/27/2015                      Relevant Orders   Comprehensive metabolic panel (Completed)   COPD - Primary    Managed with Breo and am albuterol,  Lungs fine today,        Relevant Medications   albuterol (PROVENTIL HFA;VENTOLIN HFA) 108 (90 Base) MCG/ACT inhaler   Hyperlipidemia   Relevant Orders   LDL cholesterol, direct (Completed)    Other Visit Diagnoses    Night sweats        Relevant Orders    CBC with Differential/Platelet (Completed)    Diabetes mellitus without complication ([facility])        Relevant Orders    Hemoglobin A1c (Completed)    Lipid panel (Completed)    Microalbumin / creatinine urine ratio (Completed)  Breast cancer screening        Relevant Orders    MM DIGITAL SCREENING BILATERAL       I have discontinued Ms. [place]'s NEXIUM. I have also changed her albuterol. Additionally, I am having her start on pantoprazole. Lastly, I am having her maintain her aspirin, olopatadine, Multiple Vitamins-Minerals (CENTRUM SILVER PO), vitamin C, vitamin E, zolpidem, Polyethyl Glycol-Propyl Glycol (SYSTANE OP), famotidine, Wheat Dextrin (BENEFIBER DRINK MIX PO), Vitamin D-3, hydrocortisone, Calcium-Vitamin D-Vitamin K (CALCIUM SOFT CHEWS PO), furosemide, traMADol, cyclobenzaprine, montelukast, desloratadine, fluticasone furoate-vilanterol, losartan, and albuterol.  Meds ordered this encounter  Medications  . albuterol (PROVENTIL HFA;VENTOLIN HFA) 108 (90 Base) MCG/ACT inhaler    Sig: Pt uses 2 puff qam  Only.   Give qty sufficient for 90 days    Dispense:  3 Inhaler    Refill:  3  . pantoprazole (PROTONIX) 40 MG tablet    Sig: Take 1 tablet (40 mg total) by mouth daily.    Dispense:  30 tablet    Refill:  3    Medications Discontinued During This Encounter  Medication Reason  . albuterol (PROVENTIL HFA;VENTOLIN HFA) 108 (90 Base) MCG/ACT inhaler Reorder  . NEXIUM 40 MG capsule     Follow-up: Return in about 6 months (around 05/28/2016).   [name], MD

## 2015-11-27 NOTE — Patient Instructions (Signed)

## 2015-11-27 NOTE — Assessment & Plan Note (Addendum)
Managed with Breo and am albuterol,  Lungs fine today,

## 2015-11-28 NOTE — Assessment & Plan Note (Signed)

## 2015-11-28 NOTE — Assessment & Plan Note (Signed)
Diagnosed with fasting  glucose of 140 in May 2014. A1cs have been < 6.5  on diet alone and are being checked regularly. Patient is up-to-date on annual eye exams and has no neuropathy or  proteinuria  .  Fasting lipids are elevated; statin therapy has been prescribed in the past but not tolerated due to recurrent arthralgias. Currently tolerating Zetia for management of coronary atherosclerosis.  Encouraged to continue healthy lifestyle  Including low GI diet,  regular exercise program, with goal wt loss of 20 lbs over next 6 months    Lab Results  Component Value Date   HGBA1C 6.3 11/27/2015   Lab Results  Component Value Date   MICROALBUR <0.7 11/27/2015   Lab Results  Component Value Date   CHOL 234* 11/27/2015   HDL 45.30 11/27/2015   LDLCALC 153* 11/27/2015   LDLDIRECT 169.0 11/27/2015   TRIG 178.0* 11/27/2015   CHOLHDL 5 11/27/2015

## 2015-12-01 ENCOUNTER — Encounter: Payer: Self-pay | Admitting: Internal Medicine

## 2015-12-12 ENCOUNTER — Encounter: Payer: Self-pay | Admitting: Internal Medicine

## 2015-12-14 ENCOUNTER — Encounter: Payer: Self-pay | Admitting: Internal Medicine

## 2015-12-16 MED ORDER — PANTOPRAZOLE SODIUM 40 MG PO TBEC: 40.0000 mg | DELAYED_RELEASE_TABLET | Freq: Every day | ORAL | Status: DC

## 2015-12-19 ENCOUNTER — Telehealth: Payer: Self-pay | Admitting: *Deleted

## 2015-12-19 NOTE — Telephone Encounter (Signed)
Notified patient that annual lung cancer screening low dose CT scan is due. Confirmed that patient is within the age range of 55-77, and asymptomatic, (no signs or symptoms of lung cancer). Patient denies illness that would prevent curative treatment for lung cancer if found. The patient is a former smoker, quit 2007, with a 50 pack year history. The shared decision making visit was done 12/22/13. Patient is agreeable for CT scan being scheduled.

## 2015-12-20 ENCOUNTER — Ambulatory Visit (INDEPENDENT_AMBULATORY_CARE_PROVIDER_SITE_OTHER): Payer: Medicare Other | Admitting: Pulmonary Disease

## 2015-12-20 ENCOUNTER — Encounter: Payer: Self-pay | Admitting: Pulmonary Disease

## 2015-12-20 VITALS — BP 132/88 | HR 94 | Ht 62.0 in | Wt 183.8 lb

## 2015-12-20 DIAGNOSIS — J441 Chronic obstructive pulmonary disease with (acute) exacerbation: Secondary | ICD-10-CM | POA: Diagnosis not present

## 2015-12-20 DIAGNOSIS — I2510 Atherosclerotic heart disease of native coronary artery without angina pectoris: Secondary | ICD-10-CM

## 2015-12-20 MED ORDER — PREDNISONE 20 MG PO TABS: 40.0000 mg | ORAL_TABLET | Freq: Every day | ORAL | Status: DC

## 2015-12-24 ENCOUNTER — Encounter: Payer: Self-pay | Admitting: Pulmonary Disease

## 2015-12-24 NOTE — Progress Notes (Signed)
PROBLEMS: Mild COPD with asthmatic component  INTERVAL HISTORY: Last seen 01/2016. No major events until last couple of weeks (as below)  SUBJ: Last couple of weeks, increasing dyspnea and chest tightness @ rest and with exertion. Symptoms are relieved with albuterol MDI. Se reports morning cough productive of minimal clear mucus. Denies CP, fever, purulent sputum, hemoptysis, LE edema and calf tenderness. Noted is a negative Myoview study in 02/2016  OBJ: Filed Vitals:   12/20/15 1058  BP: 132/88  Pulse: 94  Height: 5\' 2"  (1.575 m)  Weight: 183 lb 12.8 oz (83.371 kg)  SpO2: 99%   NAD @ rest HEENT WNL No JVD BS mildly diminished, few scattered wheezes RRR s M NABS, soft No C/C/E  Current Outpatient Prescriptions on File Prior to Visit  Medication Sig Dispense Refill  . albuterol (PROVENTIL HFA;VENTOLIN HFA) 108 (90 Base) MCG/ACT inhaler Pt uses 2 puff qam  Only.   Give qty sufficient for 90 days 3 Inhaler 3  . aspirin 81 MG tablet Take 81 mg by mouth daily.    . Calcium-Vitamin D-Vitamin K (CALCIUM SOFT CHEWS PO) Take by mouth daily.    . Cholecalciferol (VITAMIN D-3) 1000 UNITS CAPS Take 1 capsule by mouth daily.    Marland Kitchen desloratadine (CLARINEX) 5 MG tablet Take 1 tablet (5 mg total) by mouth daily. 90 tablet 3  . famotidine (PEPCID) 40 MG tablet Take 40 mg by mouth daily.    . Fluticasone Furoate-Vilanterol (BREO ELLIPTA) 100-25 MCG/INH AEPB Inhale 1 puff into the lungs daily. 90 each 2  . furosemide (LASIX) 20 MG tablet take 1 tablet by mouth once daily if needed 30 tablet 3  . hydrocortisone (ANUSOL-HC) 25 MG suppository Place 1 suppository (25 mg total) rectally 2 (two) times daily. 12 suppository 0  . losartan (COZAAR) 50 MG tablet Take 1 tablet (50 mg total) by mouth daily. 90 tablet 3  . montelukast (SINGULAIR) 10 MG tablet Take 1 tablet (10 mg total) by mouth at bedtime. 90 tablet 2  . Multiple Vitamins-Minerals (CENTRUM SILVER PO) Take by mouth. Take 1 am     .  olopatadine (PATANOL) 0.1 % ophthalmic solution Place 1 drop into both eyes as needed.     . pantoprazole (PROTONIX) 40 MG tablet Take 1 tablet (40 mg total) by mouth daily. 90 tablet 3  . Polyethyl Glycol-Propyl Glycol (SYSTANE OP) Apply to eye. 1-2 drops in each eye as needed.    . vitamin C (ASCORBIC ACID) 500 MG tablet Take 500 mg by mouth daily.    . vitamin E (VITAMIN E) 400 UNIT capsule Take 400 Units by mouth daily.    Marland Kitchen zolpidem (AMBIEN) 10 MG tablet Take 1 tablet (10 mg total) by mouth at bedtime as needed. 30 tablet 3   No current facility-administered medications on file prior to visit.     DATA: No new data  IMPRESSION: Mild COPD Mild AECOPD  PLAN: Continue COPD regimen as above Prednisone 40 mg daily X 5 days ROV 4 weeks  Merton Border, MD PCCM service Mobile [phone] Pager [phone] 12/24/2015

## 2016-01-01 DIAGNOSIS — K2270 Barrett's esophagus without dysplasia: Secondary | ICD-10-CM | POA: Diagnosis not present

## 2016-01-01 DIAGNOSIS — K219 Gastro-esophageal reflux disease without esophagitis: Secondary | ICD-10-CM | POA: Diagnosis not present

## 2016-01-02 DIAGNOSIS — Z1231 Encounter for screening mammogram for malignant neoplasm of breast: Secondary | ICD-10-CM | POA: Diagnosis not present

## 2016-01-02 LAB — HM MAMMOGRAPHY

## 2016-01-06 ENCOUNTER — Encounter: Payer: Self-pay | Admitting: Surgical

## 2016-01-07 DIAGNOSIS — G4733 Obstructive sleep apnea (adult) (pediatric): Secondary | ICD-10-CM | POA: Diagnosis not present

## 2016-01-07 DIAGNOSIS — J4520 Mild intermittent asthma, uncomplicated: Secondary | ICD-10-CM | POA: Diagnosis not present

## 2016-01-08 ENCOUNTER — Other Ambulatory Visit: Payer: Self-pay | Admitting: *Deleted

## 2016-01-08 DIAGNOSIS — Z87891 Personal history of nicotine dependence: Secondary | ICD-10-CM

## 2016-01-22 ENCOUNTER — Ambulatory Visit: Admission: RE | Admit: 2016-01-22 | Payer: Medicare Other | Source: Ambulatory Visit

## 2016-01-22 ENCOUNTER — Ambulatory Visit (INDEPENDENT_AMBULATORY_CARE_PROVIDER_SITE_OTHER): Payer: Medicare Other | Admitting: Pulmonary Disease

## 2016-01-22 ENCOUNTER — Ambulatory Visit
Admission: RE | Admit: 2016-01-22 | Discharge: 2016-01-22 | Disposition: A | Discharge status: Home / Self Care | Payer: Medicare Other | Source: Ambulatory Visit | Attending: Oncology | Admitting: Oncology

## 2016-01-22 ENCOUNTER — Encounter: Payer: Self-pay | Admitting: Pulmonary Disease

## 2016-01-22 VITALS — BP 132/74 | HR 69 | Ht 62.0 in | Wt 185.2 lb

## 2016-01-22 DIAGNOSIS — J449 Chronic obstructive pulmonary disease, unspecified: Secondary | ICD-10-CM

## 2016-01-22 DIAGNOSIS — Z87891 Personal history of nicotine dependence: Secondary | ICD-10-CM | POA: Diagnosis not present

## 2016-01-22 DIAGNOSIS — I2510 Atherosclerotic heart disease of native coronary artery without angina pectoris: Secondary | ICD-10-CM | POA: Insufficient documentation

## 2016-01-22 DIAGNOSIS — K760 Fatty (change of) liver, not elsewhere classified: Secondary | ICD-10-CM | POA: Diagnosis not present

## 2016-01-22 DIAGNOSIS — I700 Atherosclerosis of aorta: Secondary | ICD-10-CM | POA: Insufficient documentation

## 2016-01-22 NOTE — Patient Instructions (Signed)
No changes made

## 2016-01-22 NOTE — Progress Notes (Signed)
PROBLEMS: Mild COPD with asthmatic component  INTERVAL HISTORY: No major event  SUBJ: No new complaints. Back to her baseline. Continues on Breo and PRN albuterol. Uses albuterol MDI every morning upon awakening and otherwise very rarely as a rescue inhaler.   OBJ: Vitals:   01/22/16 0926  BP: 132/74  Pulse: 69  SpO2: 95%  Weight: 185 lb 3.2 oz (84 kg)  Height: 5\' 2"  (1.575 m)   NAD @ rest HEENT WNL No JVD BS mildly diminished, no wheezes RRR s M NABS, soft No C/C/E  Current Outpatient Prescriptions on File Prior to Visit  Medication Sig Dispense Refill  . albuterol (PROVENTIL HFA;VENTOLIN HFA) 108 (90 Base) MCG/ACT inhaler Pt uses 2 puff qam  Only.   Give qty sufficient for 90 days 3 Inhaler 3  . aspirin 81 MG tablet Take 81 mg by mouth daily.    . Calcium-Vitamin D-Vitamin K (CALCIUM SOFT CHEWS PO) Take by mouth daily.    . Cholecalciferol (VITAMIN D-3) 1000 UNITS CAPS Take 1 capsule by mouth daily.    Marland Kitchen desloratadine (CLARINEX) 5 MG tablet Take 1 tablet (5 mg total) by mouth daily. 90 tablet 3  . famotidine (PEPCID) 40 MG tablet Take 40 mg by mouth daily.    . Fluticasone Furoate-Vilanterol (BREO ELLIPTA) 100-25 MCG/INH AEPB Inhale 1 puff into the lungs daily. 90 each 2  . furosemide (LASIX) 20 MG tablet take 1 tablet by mouth once daily if needed 30 tablet 3  . hydrocortisone (ANUSOL-HC) 25 MG suppository Place 1 suppository (25 mg total) rectally 2 (two) times daily. 12 suppository 0  . losartan (COZAAR) 50 MG tablet Take 1 tablet (50 mg total) by mouth daily. 90 tablet 3  . montelukast (SINGULAIR) 10 MG tablet Take 1 tablet (10 mg total) by mouth at bedtime. 90 tablet 2  . Multiple Vitamins-Minerals (CENTRUM SILVER PO) Take by mouth. Take 1 am     . olopatadine (PATANOL) 0.1 % ophthalmic solution Place 1 drop into both eyes as needed.     . pantoprazole (PROTONIX) 40 MG tablet Take 1 tablet (40 mg total) by mouth daily. 90 tablet 3  . Polyethyl Glycol-Propyl Glycol  (SYSTANE OP) Apply to eye. 1-2 drops in each eye as needed.    . vitamin C (ASCORBIC ACID) 500 MG tablet Take 500 mg by mouth daily.    . vitamin E (VITAMIN E) 400 UNIT capsule Take 400 Units by mouth daily.    Marland Kitchen zolpidem (AMBIEN) 10 MG tablet Take 1 tablet (10 mg total) by mouth at bedtime as needed. 30 tablet 3   No current facility-administered medications on file prior to visit.      DATA: No new data  IMPRESSION: Mild COPD with asthmatic component. Presently @ baseline  PLAN: Continue Breo and PRN albuterol ROV 4-6 months  Merton Border, MD PCCM service Mobile [phone] Pager [phone] 01/22/2016

## 2016-01-27 ENCOUNTER — Telehealth: Payer: Self-pay | Admitting: *Deleted

## 2016-01-27 NOTE — Telephone Encounter (Signed)
Notified patient of LDCT lung cancer screening results with recommendation for 12 month follow up imaging. Also notified of incidental finding noted below. Patient verbalizes understanding.   IMPRESSION: 1. Lung-RADS Category 1, negative. Continue annual screening with low-dose chest CT without contrast in 12 months. 2. Aortic atherosclerosis and coronary artery calcification. 3. Hepatic steatosis

## 2016-02-07 ENCOUNTER — Encounter: Payer: Self-pay | Admitting: Internal Medicine

## 2016-02-07 ENCOUNTER — Other Ambulatory Visit: Payer: Self-pay | Admitting: Internal Medicine

## 2016-02-07 DIAGNOSIS — R922 Inconclusive mammogram: Secondary | ICD-10-CM | POA: Diagnosis not present

## 2016-02-07 DIAGNOSIS — R921 Mammographic calcification found on diagnostic imaging of breast: Secondary | ICD-10-CM | POA: Diagnosis not present

## 2016-02-07 DIAGNOSIS — N6310 Unspecified lump in the right breast, unspecified quadrant: Secondary | ICD-10-CM

## 2016-02-07 DIAGNOSIS — N649 Disorder of breast, unspecified: Secondary | ICD-10-CM | POA: Diagnosis not present

## 2016-02-07 DIAGNOSIS — R928 Other abnormal and inconclusive findings on diagnostic imaging of breast: Secondary | ICD-10-CM | POA: Diagnosis not present

## 2016-02-12 ENCOUNTER — Other Ambulatory Visit: Payer: Self-pay | Admitting: General Surgery

## 2016-02-12 ENCOUNTER — Ambulatory Visit (INDEPENDENT_AMBULATORY_CARE_PROVIDER_SITE_OTHER): Payer: Medicare Other | Admitting: General Surgery

## 2016-02-12 ENCOUNTER — Encounter: Payer: Self-pay | Admitting: General Surgery

## 2016-02-12 VITALS — BP 154/94 | HR 66 | Resp 16 | Ht 62.0 in | Wt 185.0 lb

## 2016-02-12 DIAGNOSIS — I2510 Atherosclerotic heart disease of native coronary artery without angina pectoris: Secondary | ICD-10-CM | POA: Diagnosis not present

## 2016-02-12 DIAGNOSIS — R920 Mammographic microcalcification found on diagnostic imaging of breast: Secondary | ICD-10-CM

## 2016-02-12 NOTE — Progress Notes (Addendum)
Patient ID: Traci Hall, female   DOB: 11-12-1945, 70 y.o.   MRN: [id number]  Chief Complaint  Patient presents with  . Other    mammogram    HPI Traci Hall is a 70 y.o. female who presents for a breast evaluation. The most recent mammogram was done on 01-02-16. She had a right breast ultrasound on 02-07-16. Patient does perform regular self breast checks and gets regular mammograms done.  She states she can not feel anything different in the breast. Denies any breast injury or trauma. She is in a lung cancer screening program through the [facility] that has come back negative.  She states she has pulled her right chest muscle while on vacation, [place]. She is here today with her husband of 39 years, [name].  HPI  Past Medical History:  Diagnosis Date  . Allergic rhinitis   . Asthma   . COPD (chronic obstructive pulmonary disease) ([place])    by prior PFTs  . Hyperlipidemia   . Hypertension   . OSA on CPAP   . Personal history of tobacco use, presenting hazards to health 01/08/2015    Past Surgical History:  Procedure Laterality Date  . ABDOMINAL HYSTERECTOMY  1991   secondary to endometriosis, and polyps  . APPENDECTOMY  1977  . NASAL SINUS SURGERY  2008  . SKIN CANCER EXCISION  2015, 2017    Family History  Problem Relation Age of Onset  . Heart disease Father     valvular cardiomyopathy,  mitral valve  . COPD Mother   . Hypertension Mother   . Cancer Neg Hx     Social History Social History  Substance Use Topics  . Smoking status: Former Smoker    Packs/day: 1.50    Years: 45.00    Types: Cigarettes    Quit date: 06/15/2005  . Smokeless tobacco: Never Used  . Alcohol use Yes    Allergies  Allergen Reactions  . Alendronate Sodium     Leg cramps *FOSAMAX*  . Demerol     Over sensitivity  . Naproxen     Leg cramps an swelling    . Statins     Cramps in legs     Current Outpatient Prescriptions  Medication Sig  Dispense Refill  . albuterol (PROVENTIL HFA;VENTOLIN HFA) 108 (90 Base) MCG/ACT inhaler Pt uses 2 puff qam  Only.   Give qty sufficient for 90 days 3 Inhaler 3  . aspirin 81 MG tablet Take 81 mg by mouth daily.    . Calcium-Vitamin D-Vitamin K (CALCIUM SOFT CHEWS PO) Take by mouth daily.    . Cholecalciferol (VITAMIN D-3) 1000 UNITS CAPS Take 1 capsule by mouth daily.    Marland Kitchen desloratadine (CLARINEX) 5 MG tablet Take 1 tablet (5 mg total) by mouth daily. 90 tablet 3  . famotidine (PEPCID) 40 MG tablet Take 40 mg by mouth daily.    . Fluticasone Furoate-Vilanterol (BREO ELLIPTA) 100-25 MCG/INH AEPB Inhale 1 puff into the lungs daily. 90 each 2  . furosemide (LASIX) 20 MG tablet take 1 tablet by mouth once daily if needed 30 tablet 3  . hydrocortisone (ANUSOL-HC) 25 MG suppository Place 1 suppository (25 mg total) rectally 2 (two) times daily. 12 suppository 0  . losartan (COZAAR) 50 MG tablet Take 1 tablet (50 mg total) by mouth daily. 90 tablet 3  . montelukast (SINGULAIR) 10 MG tablet Take 1 tablet (10 mg total) by mouth at bedtime. 90 tablet  2  . Multiple Vitamins-Minerals (CENTRUM SILVER PO) Take by mouth. Take 1 am     . olopatadine (PATANOL) 0.1 % ophthalmic solution Place 1 drop into both eyes as needed.     . pantoprazole (PROTONIX) 40 MG tablet Take 1 tablet (40 mg total) by mouth daily. 90 tablet 3  . Polyethyl Glycol-Propyl Glycol (SYSTANE OP) Apply to eye. 1-2 drops in each eye as needed.    . vitamin C (ASCORBIC ACID) 500 MG tablet Take 500 mg by mouth daily.    . vitamin E (VITAMIN E) 400 UNIT capsule Take 400 Units by mouth daily.    Marland Kitchen zolpidem (AMBIEN) 10 MG tablet Take 1 tablet (10 mg total) by mouth at bedtime as needed. 30 tablet 3   No current facility-administered medications for this visit.     Review of Systems Review of Systems  Blood pressure (!) 154/94, pulse 66, resp. rate 16, height 5\' 2"  (1.575 m), weight 185 lb (83.9 kg).  Physical Exam Physical Exam   Constitutional: She is oriented to person, place, and time. She appears well-developed and well-nourished.  HENT:  Mouth/Throat: Oropharynx is clear and moist.  Eyes: Conjunctivae are normal. No scleral icterus.  Neck: Neck supple.  Cardiovascular: Normal rate, regular rhythm and normal heart sounds.   Pulmonary/Chest: Effort normal and breath sounds normal. Right breast exhibits no inverted nipple, no mass, no nipple discharge, no skin change and no tenderness. Left breast exhibits no inverted nipple, no mass, no nipple discharge, no skin change and no tenderness.  Abdominal: Soft.  Lymphadenopathy:    She has no cervical adenopathy.    She has no axillary adenopathy.  Neurological: She is alert and oriented to person, place, and time.  Skin: Skin is warm and dry.  Psychiatric: Her behavior is normal.    Data Reviewed Bilateral mammograms dated 01/02/2016 completed at [facility] as well as additional views and ultrasound of 02/07/2016 were reviewed. A 3.4 cm area of breast microcalcifications are identified in the upper-outer quadrant of the right breast. These are new from her 2016 exam. BIRAD-4.  A nodular density is seen in the upper outer quadrant of the right breast. This has been present dating back to at least July 2015 with no interval change in size.  Assessment    New right breast microcalcifications.    Plan    The patient is a good candidate for stereotactic biopsy. She was offered a have the procedure completed by the radiology service at an earlier date but declined.    The stereotactic procedure was reviewed with the patient. The potential for bleeding, infection and pain was reviewed. At this time, the benefits outweigh the risk, and the patient is amenable to proceed.  Patient has been scheduled for a right breast stereotactic biopsy at [facility] for 02/24/16 at 1:00 pm. She will check-in at the [facility] at 12:30 pm. She will follow up here for a  nurse visit on 03/02/16 at 9:30 amThis patient is aware of dates, times, and instructions. Patient verbalizes understanding.   This information has been scribed by Karie Fetch RN, BSN,BC.   Robert Bellow 02/14/2016, 11:34 AM

## 2016-02-12 NOTE — Patient Instructions (Addendum)
The patient is aware to call back for any questions or concerns.  Stereotactic Breast Biopsy A stereotactic breast biopsy is a procedure in which mammography is used in the collection of a sample of breast tissue. Mammography is a type of X-ray exam of the breasts that produces an image called a mammogram. The mammogram allows your health care provider to precisely locate the area of the breast from which a tissue sample will be taken. The tissue is then examined under a microscope to see if cancerous cells are present. A breast biopsy is done when:   A lump, abnormality, or mass is seen in the breast on a breast X-ray (mammogram).   Small calcium deposits (calcifications) are seen in the breast.   The shape or appearance of the breasts changes.   The shape or appearance of the nipples changes. You may have unusual or bloody discharge coming from the nipples, or you may have crusting, retraction, or dimpling of the nipples. A breast biopsy can indicate if you need surgery or other treatment.  LET [facility] CARE PROVIDER KNOW ABOUT:  Any allergies you have.  All medicines you are taking, including vitamins, herbs, eye drops, creams, and over-the-counter medicines.  Previous problems you or members of your family have had with the use of anesthetics.  Any blood disorders you have.  Previous surgeries you have had.  Medical conditions you have. RISKS AND COMPLICATIONS Generally, stereotactic breast biopsy is a safe procedure. However, as with any procedure, complications can occur. Possible complications include:  Infection at the needle-insertion site.   Bleeding or bruising after surgery.  The breast may become altered or deformed as a result of the procedure.  The needle may go through the chest wall into the lung area.  BEFORE THE PROCEDURE  Wear a supportive bra to the procedure.  You will be asked to remove jewelry, dentures, eyeglasses, metal objects, or clothing  that might interfere with the X-ray images. You may want to leave some of these objects at home.  Arrange for someone to drive you home after the procedure if desired. PROCEDURE  A stereotactic breast biopsy is done while you are awake. During the procedure, relax as much as possible. Let your health care provider know if you are uncomfortable, anxious, or in pain. Usually, the only discomfort felt during the procedure is caused by staying in one position for the length of the procedure. This discomfort can be reduced by carefully placed cushions. Most of the time the biopsy is done using a table with openings on it. You will be asked to lie facedown on the table and place your breasts through the openings. Your breast is compressed between metal plates to get good X-ray images. Your skin will be cleaned, and a numbing medicine (local anesthetic) will be injected. A small cut (incision) will be made in your breast. The tip of the biopsy needle will be directed through the incision. Several small pieces of suspicious tissue will be taken. Then, a final set of X-ray images will be obtained. If they show that the suspicious tissue has been mostly or completely removed, a small clip will be left at the biopsy site. This is done so that the biopsy site can be easily located if the results of the biopsy show that the tissue is cancerous.  After the procedure, the incision will be stitched (sutured) or taped and covered with a bandage (dressing). Your health care provider may apply a pressure dressing and an  ice pack to prevent bleeding and swelling in the breast.  A stereotactic breast biopsy can take 30 minutes or more. AFTER THE PROCEDURE  If you are doing well and have no problems, you will be allowed to go home.    This information is not intended to replace advice given to you by your health care provider. Make sure you discuss any questions you have with your health care provider.   Document Released:  02/28/2003 Document Revised: 06/06/2013 Document Reviewed: 12/29/2012 Elsevier Interactive Patient Education [employer].  Patient has been scheduled for a right breast stereotactic biopsy at [facility] for 02/24/16 at 1:00 pm. She will check-in at the [facility] at 12:30 pm. She will follow up here for a nurse visit on 03/02/16 at 9:30 amThis patient is aware of dates, times, and instructions. Patient verbalizes understanding.

## 2016-02-14 DIAGNOSIS — R920 Mammographic microcalcification found on diagnostic imaging of breast: Secondary | ICD-10-CM | POA: Insufficient documentation

## 2016-02-18 ENCOUNTER — Inpatient Hospital Stay
Admission: RE | Admit: 2016-02-18 | Discharge: 2016-02-18 | Disposition: A | Discharge status: Home / Self Care | Payer: Self-pay | Source: Ambulatory Visit | Attending: *Deleted | Admitting: *Deleted

## 2016-02-18 ENCOUNTER — Other Ambulatory Visit: Payer: Self-pay | Admitting: *Deleted

## 2016-02-18 DIAGNOSIS — Z9289 Personal history of other medical treatment: Secondary | ICD-10-CM

## 2016-02-24 ENCOUNTER — Ambulatory Visit
Admission: RE | Admit: 2016-02-24 | Discharge: 2016-02-24 | Disposition: A | Discharge status: Home / Self Care | Payer: Medicare Other | Source: Ambulatory Visit | Attending: General Surgery | Admitting: General Surgery

## 2016-02-24 DIAGNOSIS — R920 Mammographic microcalcification found on diagnostic imaging of breast: Secondary | ICD-10-CM | POA: Diagnosis not present

## 2016-02-24 DIAGNOSIS — C50411 Malignant neoplasm of upper-outer quadrant of right female breast: Secondary | ICD-10-CM | POA: Diagnosis not present

## 2016-02-24 HISTORY — PX: BREAST BIOPSY: SHX20

## 2016-02-25 ENCOUNTER — Encounter: Payer: Self-pay | Admitting: Internal Medicine

## 2016-02-25 ENCOUNTER — Telehealth: Payer: Self-pay | Admitting: *Deleted

## 2016-02-25 DIAGNOSIS — C50411 Malignant neoplasm of upper-outer quadrant of right female breast: Secondary | ICD-10-CM

## 2016-02-25 NOTE — Telephone Encounter (Signed)
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

## 2016-02-26 ENCOUNTER — Encounter: Payer: Self-pay | Admitting: Internal Medicine

## 2016-02-26 ENCOUNTER — Ambulatory Visit (INDEPENDENT_AMBULATORY_CARE_PROVIDER_SITE_OTHER): Payer: Medicare Other | Admitting: General Surgery

## 2016-02-26 ENCOUNTER — Ambulatory Visit: Payer: Self-pay

## 2016-02-26 ENCOUNTER — Encounter: Payer: Self-pay | Admitting: General Surgery

## 2016-02-26 VITALS — BP 130/76 | HR 74 | Resp 14 | Ht 62.0 in | Wt 184.0 lb

## 2016-02-26 DIAGNOSIS — I2510 Atherosclerotic heart disease of native coronary artery without angina pectoris: Secondary | ICD-10-CM

## 2016-02-26 DIAGNOSIS — C50411 Malignant neoplasm of upper-outer quadrant of right female breast: Secondary | ICD-10-CM | POA: Diagnosis not present

## 2016-02-26 DIAGNOSIS — R920 Mammographic microcalcification found on diagnostic imaging of breast: Secondary | ICD-10-CM

## 2016-02-26 NOTE — Progress Notes (Signed)
Patient ID: Traci Hall, female   DOB: 1946/03/15, 70 y.o.   MRN: [id number]  Chief Complaint  Patient presents with  . Routine Post Op    HPI Traci Hall is a 70 y.o. female here today to discuss option from her right breast stereo that was done on 02/24/16. The patient had been contacted with the pathology results prior to today's visit.  Her husband and her daughter are with her. HPI  Past Medical History:  Diagnosis Date  . Allergic rhinitis   . Asthma   . COPD (chronic obstructive pulmonary disease) ([place])    by prior PFTs  . Hyperlipidemia   . Hypertension   . OSA on CPAP   . Personal history of tobacco use, presenting hazards to health 01/08/2015    Past Surgical History:  Procedure Laterality Date  . ABDOMINAL HYSTERECTOMY  1991   secondary to endometriosis, and polyps  . APPENDECTOMY  1977  . BREAST BIOPSY Right 02/24/2016   path pending  . NASAL SINUS SURGERY  2008  . SKIN CANCER EXCISION  2015, 2017    Family History  Problem Relation Age of Onset  . Heart disease Father     valvular cardiomyopathy,  mitral valve  . COPD Mother   . Hypertension Mother   . Cancer Neg Hx     Social History Social History  Substance Use Topics  . Smoking status: Former Smoker    Packs/day: 1.50    Years: 45.00    Types: Cigarettes    Quit date: 06/15/2005  . Smokeless tobacco: Never Used  . Alcohol use Yes    Allergies  Allergen Reactions  . Alendronate Sodium     Leg cramps *FOSAMAX*  . Demerol     Over sensitivity  . Naproxen     Leg cramps an swelling    . Statins     Cramps in legs     Current Outpatient Prescriptions  Medication Sig Dispense Refill  . albuterol (PROVENTIL HFA;VENTOLIN HFA) 108 (90 Base) MCG/ACT inhaler Pt uses 2 puff qam  Only.   Give qty sufficient for 90 days 3 Inhaler 3  . aspirin 81 MG tablet Take 81 mg by mouth daily.    . Calcium-Vitamin D-Vitamin K (CALCIUM SOFT CHEWS PO) Take by mouth daily.    . Cholecalciferol  (VITAMIN D-3) 1000 UNITS CAPS Take 1 capsule by mouth daily.    Marland Kitchen desloratadine (CLARINEX) 5 MG tablet Take 1 tablet (5 mg total) by mouth daily. 90 tablet 3  . famotidine (PEPCID) 40 MG tablet Take 40 mg by mouth daily.    . Fluticasone Furoate-Vilanterol (BREO ELLIPTA) 100-25 MCG/INH AEPB Inhale 1 puff into the lungs daily. 90 each 2  . furosemide (LASIX) 20 MG tablet take 1 tablet by mouth once daily if needed 30 tablet 3  . hydrocortisone (ANUSOL-HC) 25 MG suppository Place 1 suppository (25 mg total) rectally 2 (two) times daily. 12 suppository 0  . losartan (COZAAR) 50 MG tablet Take 1 tablet (50 mg total) by mouth daily. 90 tablet 3  . montelukast (SINGULAIR) 10 MG tablet Take 1 tablet (10 mg total) by mouth at bedtime. 90 tablet 2  . Multiple Vitamins-Minerals (CENTRUM SILVER PO) Take by mouth. Take 1 am     . olopatadine (PATANOL) 0.1 % ophthalmic solution Place 1 drop into both eyes as needed.     . pantoprazole (PROTONIX) 40 MG tablet Take 1 tablet (40 mg total) by mouth daily. 90 tablet 3  .  Polyethyl Glycol-Propyl Glycol (SYSTANE OP) Apply to eye. 1-2 drops in each eye as needed.    . vitamin C (ASCORBIC ACID) 500 MG tablet Take 500 mg by mouth daily.    . vitamin E (VITAMIN E) 400 UNIT capsule Take 400 Units by mouth daily.    Marland Kitchen zolpidem (AMBIEN) 10 MG tablet Take 1 tablet (10 mg total) by mouth at bedtime as needed. 30 tablet 3   No current facility-administered medications for this visit.     Review of Systems Review of Systems  Constitutional: Negative.   Respiratory: Negative.   Cardiovascular: Negative.     Blood pressure 130/76, pulse 74, resp. rate 14, height '5\' 2"'$  (1.575 m), weight 184 lb (83.5 kg).  Physical Exam Physical Exam  Constitutional: She is oriented to person, place, and time. She appears well-developed and well-nourished.  Pulmonary/Chest:  No bruising at the biopsy site.   Neurological: She is alert and oriented to person, place, and time.  Skin:  Skin is warm and dry.    Data Reviewed A. MICROCALCIFICATIONS, RIGHT BREAST, UPPER OUTER QUADRANT; BIOPSY:  - INVASIVE MAMMARY CARCINOMA.  - TUMOR SIZE IN THIS CORE SAMPLE: 3 MM.  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  ER negative, PR negative, HER-2/neu 3+.  Ultrasound examination of the breast was completed to determine if the biopsy site was visible. There is a superficial distortion evident at the 12:00 position measuring up to 6 mm in diameter, this does not correspond with postbiopsy images. Wire localization will be required prior to any breast conservation procedure.  Assessment    Right breast cancer.     Plan    The majority of the visit was spent reviewing the options for breast cancer treatment. Breast conservation with lumpectomy and radiation therapy  was presented as equivalent to mastectomy for long-term control. The pros and cons of each treatment regimen were reviewed. The indications for additional therapy such as chemotherapy were touched on briefly, realizing that the majority of information required to determine if chemotherapy would be of benefit is not available at this time. The availability of consultation services for medical oncology and radiation oncology prior to surgery were reviewed.  Options for second surgical opinion were reviewed.  Website address for further research on the patient's part was provided.  She will contact the office as to how she would like to proceed.       Robert Bellow 02/28/2016, 1:59 PM

## 2016-02-27 LAB — SURGICAL PATHOLOGY

## 2016-02-28 DIAGNOSIS — C50411 Malignant neoplasm of upper-outer quadrant of right female breast: Secondary | ICD-10-CM | POA: Insufficient documentation

## 2016-02-28 NOTE — Telephone Encounter (Signed)
Patient requesting 2nd opinion from [facility] for management of breast cancer found on biopsy of microcalcifications seen on right breast recent screening mammogram . Referral is in process as requested

## 2016-02-29 ENCOUNTER — Other Ambulatory Visit: Payer: Self-pay | Admitting: Internal Medicine

## 2016-03-02 ENCOUNTER — Ambulatory Visit: Payer: Medicare Other

## 2016-03-09 ENCOUNTER — Encounter: Payer: Self-pay | Admitting: *Deleted

## 2016-03-09 NOTE — Progress Notes (Signed)
  Oncology Nurse Navigator Documentation  Navigator Location: [facility] (03/09/16 1600) Navigator Encounter Type: Introductory phone call (03/09/16 1600)     Confirmed Diagnosis Date: 02/27/16 (03/09/16 1600)                                    Time Spent with Patient: 15 (03/09/16 1600)   Called patient today to introduce to navigation services.  States she is waiting on an appointment for a second opinion at [facility].  States she daughter in law was treated for breast cancer there and she wants her to see her physician.  States she or Dr. Derrel Nip will inform Dr. Bary Castilla of her decision as to where she will get surgery.

## 2016-03-12 ENCOUNTER — Telehealth: Payer: Self-pay | Admitting: *Deleted

## 2016-03-12 NOTE — Telephone Encounter (Signed)
Patient stated that she has a referral for to [facility] cancer center, she also has an appt for a endoscopy on 03/17/16. She questioned if she should hold off on the endoscopy appt until she's seen at the [facility] cancer center.  Pt contact [phone]

## 2016-03-12 NOTE — Telephone Encounter (Signed)
Recommendations on reschedule of endoscopy?

## 2016-03-13 DIAGNOSIS — H43813 Vitreous degeneration, bilateral: Secondary | ICD-10-CM | POA: Diagnosis not present

## 2016-03-13 NOTE — Telephone Encounter (Signed)
FYI.  I spoke to Dr [name].  Informed of her new diagnosis of breast cancer and her question of whether or not to proceed with EGD on 03/17/16.  With her history of Barretts, he preferred her to go ahead and proceed with procedure, given that once she starts treatment for her breast cancer -this may delay her having her scope.  Pt was informed and is comfortable with that decision.  She wanted me to forward to you to make you aware.

## 2016-03-16 ENCOUNTER — Encounter: Payer: Self-pay | Admitting: *Deleted

## 2016-03-17 ENCOUNTER — Ambulatory Visit: Payer: Medicare Other | Admitting: Anesthesiology

## 2016-03-17 ENCOUNTER — Encounter
Admission: RE | Disposition: A | Discharge status: Home / Self Care | Payer: Self-pay | Source: Ambulatory Visit | Attending: Gastroenterology

## 2016-03-17 ENCOUNTER — Ambulatory Visit
Admission: RE | Admit: 2016-03-17 | Discharge: 2016-03-17 | Disposition: A | Discharge status: Home / Self Care | Payer: Medicare Other | Source: Ambulatory Visit | Attending: Gastroenterology | Admitting: Gastroenterology

## 2016-03-17 DIAGNOSIS — Z7982 Long term (current) use of aspirin: Secondary | ICD-10-CM | POA: Insufficient documentation

## 2016-03-17 DIAGNOSIS — J449 Chronic obstructive pulmonary disease, unspecified: Secondary | ICD-10-CM | POA: Diagnosis not present

## 2016-03-17 DIAGNOSIS — Z87891 Personal history of nicotine dependence: Secondary | ICD-10-CM | POA: Insufficient documentation

## 2016-03-17 DIAGNOSIS — K210 Gastro-esophageal reflux disease with esophagitis: Secondary | ICD-10-CM | POA: Diagnosis not present

## 2016-03-17 DIAGNOSIS — K2970 Gastritis, unspecified, without bleeding: Secondary | ICD-10-CM | POA: Diagnosis not present

## 2016-03-17 DIAGNOSIS — K219 Gastro-esophageal reflux disease without esophagitis: Secondary | ICD-10-CM | POA: Diagnosis not present

## 2016-03-17 DIAGNOSIS — E669 Obesity, unspecified: Secondary | ICD-10-CM | POA: Diagnosis not present

## 2016-03-17 DIAGNOSIS — Z6833 Body mass index (BMI) 33.0-33.9, adult: Secondary | ICD-10-CM | POA: Diagnosis not present

## 2016-03-17 DIAGNOSIS — I10 Essential (primary) hypertension: Secondary | ICD-10-CM | POA: Insufficient documentation

## 2016-03-17 DIAGNOSIS — G4733 Obstructive sleep apnea (adult) (pediatric): Secondary | ICD-10-CM | POA: Insufficient documentation

## 2016-03-17 DIAGNOSIS — Z09 Encounter for follow-up examination after completed treatment for conditions other than malignant neoplasm: Secondary | ICD-10-CM | POA: Diagnosis present

## 2016-03-17 DIAGNOSIS — Z79899 Other long term (current) drug therapy: Secondary | ICD-10-CM | POA: Diagnosis not present

## 2016-03-17 DIAGNOSIS — I2510 Atherosclerotic heart disease of native coronary artery without angina pectoris: Secondary | ICD-10-CM | POA: Diagnosis not present

## 2016-03-17 DIAGNOSIS — K2270 Barrett's esophagus without dysplasia: Secondary | ICD-10-CM | POA: Insufficient documentation

## 2016-03-17 DIAGNOSIS — K2950 Unspecified chronic gastritis without bleeding: Secondary | ICD-10-CM | POA: Diagnosis not present

## 2016-03-17 HISTORY — DX: Gastro-esophageal reflux disease without esophagitis: K21.9

## 2016-03-17 HISTORY — DX: Barrett's esophagus without dysplasia: K22.70

## 2016-03-17 HISTORY — DX: Polyp of colon: K63.5

## 2016-03-17 HISTORY — PX: ESOPHAGOGASTRODUODENOSCOPY (EGD) WITH PROPOFOL: SHX5813

## 2016-03-17 HISTORY — DX: Obesity, unspecified: E66.9

## 2016-03-17 LAB — GLUCOSE, CAPILLARY: Glucose-Capillary: 145 mg/dL — ABNORMAL HIGH (ref 65–99)

## 2016-03-17 SURGERY — ESOPHAGOGASTRODUODENOSCOPY (EGD) WITH PROPOFOL
Anesthesia: General

## 2016-03-17 MED ORDER — SODIUM CHLORIDE 0.9 % IV SOLN: INTRAVENOUS | Status: DC

## 2016-03-17 MED ORDER — MIDAZOLAM HCL 2 MG/2ML IJ SOLN
INTRAMUSCULAR | Status: DC | PRN
  Administered 2016-03-17: 1 mg via INTRAVENOUS

## 2016-03-17 MED ORDER — FENTANYL CITRATE (PF) 100 MCG/2ML IJ SOLN
INTRAMUSCULAR | Status: DC | PRN
Start: 2016-03-17 — End: 2016-03-17
  Administered 2016-03-17: 50 ug via INTRAVENOUS

## 2016-03-17 MED ORDER — SODIUM CHLORIDE 0.9 % IV SOLN
INTRAVENOUS | Status: DC
  Administered 2016-03-17: 09:00:00 via INTRAVENOUS
  Administered 2016-03-17: 1000 mL via INTRAVENOUS

## 2016-03-17 MED ORDER — PROPOFOL 10 MG/ML IV BOLUS
INTRAVENOUS | Status: DC | PRN
  Administered 2016-03-17: 100 mg via INTRAVENOUS

## 2016-03-17 MED ORDER — PROPOFOL 500 MG/50ML IV EMUL
INTRAVENOUS | Status: DC | PRN
  Administered 2016-03-17: 140 ug/kg/min via INTRAVENOUS

## 2016-03-17 MED ORDER — LIDOCAINE HCL (CARDIAC) 20 MG/ML IV SOLN
INTRAVENOUS | Status: DC | PRN
  Administered 2016-03-17: 100 mg via INTRAVENOUS

## 2016-03-17 NOTE — Anesthesia Preprocedure Evaluation (Signed)
Anesthesia Evaluation  Patient identified by MRN, date of birth, ID band Patient awake    Reviewed: Allergy & Precautions, NPO status , Patient's Chart, lab work & pertinent test results  History of Anesthesia Complications Negative for: history of anesthetic complications  Airway Mallampati: III       Dental   Pulmonary asthma , sleep apnea , COPD,  COPD inhaler, former smoker,           Cardiovascular hypertension, Pt. on medications + angina + CAD       Neuro/Psych Anxiety    GI/Hepatic GERD  Medicated,(+) Hepatitis - (fatty liver)  Endo/Other  diabetes (borderline)  Renal/GU negative Renal ROS     Musculoskeletal   Abdominal   Peds  Hematology negative hematology ROS (+)   Anesthesia Other Findings   Reproductive/Obstetrics                             Anesthesia Physical Anesthesia Plan  ASA: III  Anesthesia Plan: General   Post-op Pain Management:    Induction: Intravenous  Airway Management Planned: Nasal Cannula  Additional Equipment:   Intra-op Plan:   Post-operative Plan:   Informed Consent: I have reviewed the patients History and Physical, chart, labs and discussed the procedure including the risks, benefits and alternatives for the proposed anesthesia with the patient or authorized representative who has indicated his/her understanding and acceptance.     Plan Discussed with:   Anesthesia Plan Comments:         Anesthesia Quick Evaluation

## 2016-03-17 NOTE — Op Note (Signed)
[facility]astroenterology Patient Name: Traci Hall [facility] Procedure Date: 03/17/2016 9:10 AM MRN: [id number] Account #: [id number] Date of Birth: January 04, 1946 Admit Type: Outpatient Age: 70 Room: [facility] ENDO ROOM 3 Gender: Female Note Status: Finalized Procedure:            Upper GI endoscopy Indications:          Follow-up of Barrett's esophagus Providers:            [name], MD Referring MD:         Deborra Medina, MD (Referring MD) Medicines:            Monitored Anesthesia Care Complications:        No immediate complications. Procedure:            Pre-Anesthesia Assessment:                       - ASA Grade Assessment: III - A patient with severe                        systemic disease.                       After obtaining informed consent, the endoscope was                        passed under direct vision. Throughout the procedure,                        the patient's blood pressure, pulse, and oxygen                        saturations were monitored continuously. The Endoscope                        was introduced through the mouth, and advanced to the                        third part of duodenum. The upper GI endoscopy was                        accomplished without difficulty. The patient tolerated                        the procedure well. Findings:      There were esophageal mucosal changes consistent with short-segment       Barrett's esophagus present at the gastroesophageal junction. The       maximum longitudinal extent of these mucosal changes was 1 cm in length.       Mucosa was biopsied with a cold forceps for histology in 4 quadrants at       40 cm from the incisors.      Patchy mild inflammation characterized by congestion (edema) and       erythema was found in the gastric body. Biopsies were taken with a cold       forceps for histology.      The cardia and gastric fundus were normal on retroflexion.      The exam of the stomach  was otherwise normal.      The examined duodenum was normal. Impression:           -  Esophageal mucosal changes consistent with                        short-segment Barrett's esophagus. Biopsied.                       - Gastritis. Biopsied.                       - Normal examined duodenum. Recommendation:       - Discharge patient to home.                       - Continue present medications.                       - Telephone GI clinic for pathology results in 1 week. Procedure Code(s):    --- Professional ---                       [phone], Esophagogastroduodenoscopy, flexible, transoral;                        with biopsy, single or multiple Diagnosis Code(s):    --- Professional ---                       K22.70, Barrett's esophagus without dysplasia                       K29.70, Gastritis, unspecified, without bleeding CPT copyright 2016 American Medical Association. All rights reserved. The codes documented in this report are preliminary and upon coder review may  be revised to meet current compliance requirements. [name], MD 03/17/2016 9:32:26 AM This report has been signed electronically. Number of Addenda: 0 Note Initiated On: 03/17/2016 9:10 AM      [facility]

## 2016-03-17 NOTE — H&P (Addendum)
Outpatient short stay form Pre-procedure 03/17/2016 9:04 AM [name] MD  Primary Physician: Dr Deborra Medina   Reason for visit:  EGD  History of present illness:  Patient is a 69 year old female presenting today for EGD in follow-up of her personal history of Barrett's esophagus. He is having no difficulty with heartburn or dysphagia. He is taking pantoprazole 40 mg daily. Patient does take 81 mg aspirin daily, but has held that for about 5 days. She takes no other aspirin products or blood thinning agents.    Current Facility-Administered Medications:  .  0.9 %  sodium chloride infusion, , Intravenous, Continuous, [name], MD, Last Rate: 20 mL/hr at 03/17/16 0811, 1,000 mL at 03/17/16 0811 .  0.9 %  sodium chloride infusion, , Intravenous, Continuous, [name], MD  Prescriptions Prior to Admission  Medication Sig Dispense Refill Last Dose  . albuterol (PROVENTIL HFA;VENTOLIN HFA) 108 (90 Base) MCG/ACT inhaler Pt uses 2 puff qam  Only.   Give qty sufficient for 90 days 3 Inhaler 3 03/16/2016 at Unknown time  . aspirin 81 MG tablet Take 81 mg by mouth daily.   Past Week at Unknown time  . Calcium-Vitamin D-Vitamin K (CALCIUM SOFT CHEWS PO) Take by mouth daily.   Past Week at Unknown time  . Cholecalciferol (VITAMIN D-3) 1000 UNITS CAPS Take 1 capsule by mouth daily.   03/16/2016 at Unknown time  . desloratadine (CLARINEX) 5 MG tablet Take 1 tablet (5 mg total) by mouth daily. 90 tablet 3 03/16/2016 at Unknown time  . famotidine (PEPCID) 40 MG tablet Take 40 mg by mouth daily.   03/16/2016 at Unknown time  . Fluticasone Furoate-Vilanterol (BREO ELLIPTA) 100-25 MCG/INH AEPB Inhale 1 puff into the lungs daily. 90 each 2 03/16/2016 at Unknown time  . furosemide (LASIX) 20 MG tablet take 1 tablet by mouth once daily if needed 30 tablet 3 Past Week at Unknown time  . hydrocortisone (ANUSOL-HC) 25 MG suppository Place 1 suppository (25 mg total) rectally 2 (two) times daily.  12 suppository 0 Past Week at Unknown time  . losartan (COZAAR) 50 MG tablet Take 1 tablet (50 mg total) by mouth daily. 90 tablet 3 03/16/2016 at Unknown time  . montelukast (SINGULAIR) 10 MG tablet TAKE 1 TABLET AT BEDTIME 90 tablet 2 03/16/2016 at Unknown time  . Multiple Vitamins-Minerals (CENTRUM SILVER PO) Take by mouth. Take 1 am    Past Week at Unknown time  . olopatadine (PATANOL) 0.1 % ophthalmic solution Place 1 drop into both eyes as needed.    03/16/2016 at Unknown time  . pantoprazole (PROTONIX) 40 MG tablet Take 1 tablet (40 mg total) by mouth daily. 90 tablet 3 03/16/2016 at Unknown time  . Polyethyl Glycol-Propyl Glycol (SYSTANE OP) Apply to eye. 1-2 drops in each eye as needed.   03/16/2016 at Unknown time  . vitamin C (ASCORBIC ACID) 500 MG tablet Take 500 mg by mouth daily.   03/16/2016 at Unknown time  . vitamin E (VITAMIN E) 400 UNIT capsule Take 400 Units by mouth daily.   03/16/2016 at Unknown time  . zolpidem (AMBIEN) 10 MG tablet Take 1 tablet (10 mg total) by mouth at bedtime as needed. (Patient not taking: Reported on 03/17/2016) 30 tablet 3 Not Taking at Unknown time     Allergies  Allergen Reactions  . Alendronate Sodium     Leg cramps *FOSAMAX*  . Demerol     Over sensitivity  . Naproxen  Leg cramps an swelling    . Statins     Cramps in legs      Past Medical History:  Diagnosis Date  . Allergic rhinitis   . Asthma   . Barrett's esophagus   . COPD (chronic obstructive pulmonary disease) ([place])    by prior PFTs  . GERD (gastroesophageal reflux disease)   . Hyperlipidemia   . Hypertension   . Obesity   . OSA on CPAP   . Personal history of tobacco use, presenting hazards to health 01/08/2015  . Polyp of colon     Review of systems:      Physical Exam    Heart and lungs: Regular rate and rhythm without rub or gallop, lungs are bilaterally clear.   HEENT: Normocephalic atraumatic eyes are anicteric    Other:     Pertinant exam for procedure:  Soft nontender nondistended bowel sounds positive normoactive.    Planned proceedures: EGD and indicated procedures. I have discussed the risks benefits and complications of procedures to include not limited to bleeding, infection, perforation and the risk of sedation and the patient wishes to proceed.    [name], MD Gastroenterology 03/17/2016  9:04 AM

## 2016-03-17 NOTE — Anesthesia Procedure Notes (Signed)
Date/Time: 03/17/2016 9:15 AM Performed by: Allean Found Pre-anesthesia Checklist: Patient identified, Emergency Drugs available, Suction available, Patient being monitored and Timeout performed Patient Re-evaluated:Patient Re-evaluated prior to inductionOxygen Delivery Method: Nasal cannula Intubation Type: IV induction

## 2016-03-17 NOTE — Anesthesia Postprocedure Evaluation (Signed)
Anesthesia Post Note  Patient: Traci Hall  Procedure(s) Performed: Procedure(s) (LRB): ESOPHAGOGASTRODUODENOSCOPY (EGD) WITH PROPOFOL (N/A)  Patient location during evaluation: PACU Anesthesia Type: General Level of consciousness: awake and alert Pain management: pain level controlled Vital Signs Assessment: post-procedure vital signs reviewed and stable Respiratory status: spontaneous breathing and respiratory function stable Cardiovascular status: stable Anesthetic complications: no    Last Vitals:  Vitals:   03/17/16 0800 03/17/16 0930  BP:  (!) 104/44  Pulse: 79 76  Resp: 16 18  Temp: (!) 35.9 C (!) 35.9 C    Last Pain:  Vitals:   03/17/16 0930  TempSrc: Tympanic                 KEPHART,WILLIAM K

## 2016-03-17 NOTE — Transfer of Care (Signed)
Immediate Anesthesia Transfer of Care Note  Patient: Traci Hall  Procedure(s) Performed: Procedure(s): ESOPHAGOGASTRODUODENOSCOPY (EGD) WITH PROPOFOL (N/A)  Patient Location: PACU  Anesthesia Type:General  Level of Consciousness: awake  Airway & Oxygen Therapy: Patient Spontanous Breathing and Patient connected to nasal cannula oxygen  Post-op Assessment: Report given to RN and Post -op Vital signs reviewed and stable  Post vital signs: Reviewed and stable  Last Vitals:  Vitals:   03/17/16 0800 03/17/16 0930  BP:  (!) 104/44  Pulse: 79 76  Resp: 16 18  Temp: (!) 35.9 C (!) 35.9 C    Last Pain:  Vitals:   03/17/16 0930  TempSrc: Tympanic         Complications: No apparent anesthesia complications

## 2016-03-18 ENCOUNTER — Encounter: Payer: Self-pay | Admitting: Gastroenterology

## 2016-03-18 ENCOUNTER — Encounter: Payer: Self-pay | Admitting: Internal Medicine

## 2016-03-18 LAB — SURGICAL PATHOLOGY

## 2016-03-19 DIAGNOSIS — C50411 Malignant neoplasm of upper-outer quadrant of right female breast: Secondary | ICD-10-CM | POA: Diagnosis not present

## 2016-03-19 DIAGNOSIS — C50911 Malignant neoplasm of unspecified site of right female breast: Secondary | ICD-10-CM | POA: Diagnosis not present

## 2016-03-19 DIAGNOSIS — Z171 Estrogen receptor negative status [ER-]: Secondary | ICD-10-CM | POA: Diagnosis not present

## 2016-03-20 ENCOUNTER — Telehealth: Payer: Self-pay | Admitting: General Surgery

## 2016-03-20 NOTE — Telephone Encounter (Signed)
PT CALLED TO LET us KNOW SHE WILL BE GOING TO [facility] FOR HER SURGERY.SHE WANTED A MESSAGE TO BE GIVEN TO DR Magdalene Patricia YOU SO MUCH FOR EVERYTHING YOU DID."

## 2016-03-27 DIAGNOSIS — K7581 Nonalcoholic steatohepatitis (NASH): Secondary | ICD-10-CM | POA: Diagnosis not present

## 2016-03-27 DIAGNOSIS — I2510 Atherosclerotic heart disease of native coronary artery without angina pectoris: Secondary | ICD-10-CM | POA: Diagnosis not present

## 2016-03-27 DIAGNOSIS — J449 Chronic obstructive pulmonary disease, unspecified: Secondary | ICD-10-CM | POA: Diagnosis not present

## 2016-03-27 DIAGNOSIS — K2270 Barrett's esophagus without dysplasia: Secondary | ICD-10-CM | POA: Diagnosis not present

## 2016-03-27 DIAGNOSIS — C50511 Malignant neoplasm of lower-outer quadrant of right female breast: Secondary | ICD-10-CM | POA: Diagnosis not present

## 2016-03-27 DIAGNOSIS — C50411 Malignant neoplasm of upper-outer quadrant of right female breast: Secondary | ICD-10-CM | POA: Diagnosis not present

## 2016-03-27 DIAGNOSIS — K219 Gastro-esophageal reflux disease without esophagitis: Secondary | ICD-10-CM | POA: Diagnosis not present

## 2016-03-27 DIAGNOSIS — I10 Essential (primary) hypertension: Secondary | ICD-10-CM | POA: Diagnosis not present

## 2016-03-27 DIAGNOSIS — E669 Obesity, unspecified: Secondary | ICD-10-CM | POA: Diagnosis not present

## 2016-03-27 DIAGNOSIS — Z85828 Personal history of other malignant neoplasm of skin: Secondary | ICD-10-CM | POA: Diagnosis not present

## 2016-03-27 DIAGNOSIS — C50911 Malignant neoplasm of unspecified site of right female breast: Secondary | ICD-10-CM | POA: Diagnosis not present

## 2016-03-27 DIAGNOSIS — E7800 Pure hypercholesterolemia, unspecified: Secondary | ICD-10-CM | POA: Diagnosis not present

## 2016-03-27 DIAGNOSIS — Z6832 Body mass index (BMI) 32.0-32.9, adult: Secondary | ICD-10-CM | POA: Diagnosis not present

## 2016-03-27 DIAGNOSIS — G4730 Sleep apnea, unspecified: Secondary | ICD-10-CM | POA: Diagnosis not present

## 2016-03-27 DIAGNOSIS — M1990 Unspecified osteoarthritis, unspecified site: Secondary | ICD-10-CM | POA: Diagnosis not present

## 2016-03-27 DIAGNOSIS — Z171 Estrogen receptor negative status [ER-]: Secondary | ICD-10-CM | POA: Diagnosis not present

## 2016-03-27 HISTORY — PX: BREAST SURGERY: SHX581

## 2016-04-03 ENCOUNTER — Encounter: Payer: Self-pay | Admitting: Family Medicine

## 2016-04-03 ENCOUNTER — Ambulatory Visit (INDEPENDENT_AMBULATORY_CARE_PROVIDER_SITE_OTHER): Payer: Medicare Other | Admitting: Family Medicine

## 2016-04-03 VITALS — BP 140/82 | HR 100 | Temp 98.2°F | Wt 182.1 lb

## 2016-04-03 DIAGNOSIS — I2510 Atherosclerotic heart disease of native coronary artery without angina pectoris: Secondary | ICD-10-CM

## 2016-04-03 DIAGNOSIS — J441 Chronic obstructive pulmonary disease with (acute) exacerbation: Secondary | ICD-10-CM | POA: Diagnosis not present

## 2016-04-03 MED ORDER — AMOXICILLIN-POT CLAVULANATE 875-125 MG PO TABS: 1.0000 | ORAL_TABLET | Freq: Two times a day (BID) | ORAL | 0 refills | Status: DC

## 2016-04-03 MED ORDER — PREDNISONE 50 MG PO TABS: ORAL_TABLET | ORAL | 0 refills | Status: DC

## 2016-04-03 MED ORDER — IPRATROPIUM BROMIDE 0.02 % IN SOLN
0.5000 mg | Freq: Once | RESPIRATORY_TRACT | Status: AC
  Administered 2016-04-03: 0.5 mg via RESPIRATORY_TRACT

## 2016-04-03 MED ORDER — HYDROCOD POLST-CPM POLST ER 10-8 MG/5ML PO SUER: 5.0000 mL | Freq: Two times a day (BID) | ORAL | 0 refills | Status: DC | PRN

## 2016-04-03 MED ORDER — ALBUTEROL SULFATE (2.5 MG/3ML) 0.083% IN NEBU
2.5000 mg | INHALATION_SOLUTION | Freq: Once | RESPIRATORY_TRACT | Status: AC
  Administered 2016-04-03: 2.5 mg via RESPIRATORY_TRACT

## 2016-04-03 NOTE — Assessment & Plan Note (Signed)
Patient presenting with signs and symptoms of an acute COPD exacerbation. Breathing treatment given today given severe wheezing noted on exam. Patient had improvement following recent treatment. Treating with prednisone and doxycycline. Tussionex given for cough.

## 2016-04-03 NOTE — Patient Instructions (Signed)
Take the medications as prescribed.  Follow up as needed.  Take care  Dr. Lacinda Axon

## 2016-04-03 NOTE — Progress Notes (Signed)
Pre visit review using our clinic review tool, if applicable. No additional management support is needed unless otherwise documented below in the visit note. 

## 2016-04-03 NOTE — Progress Notes (Signed)
Subjective:  Patient ID: Traci Hall, female    DOB: 1946-02-23  Age: 70 y.o. MRN: [id number]  CC: Cough, wheezing  HPI:  70 year old female with a history of COPD presents with the above complaints.  Patient reports that she has not felt well since October 14. She states that her symptoms initially started with atypical head cold. It is now progressed to wheezing and severe, productive cough. She states that she's using albuterol more frequently without significant improvement. She endorses compliance with her Breo. No associated fever or chills. She does have some shortness of breath. No known exacerbating factors. No other associated symptoms. No other complaints at this time.  Social Hx   Social History   Social History  . Marital status: Married    Spouse name: N/A  . Number of children: 2  . Years of education: N/A   Occupational History  . retired     Social History Main Topics  . Smoking status: Former Smoker    Packs/day: 1.50    Years: 45.00    Types: Cigarettes    Quit date: 06/15/2005  . Smokeless tobacco: Never Used  . Alcohol use Yes  . Drug use: No  . Sexual activity: Not Asked   Other Topics Concern  . None   Social History Narrative  . None   Review of Systems  Constitutional: Negative for fever.  HENT: Positive for congestion.   Respiratory: Positive for cough and wheezing.    Objective:  BP 140/82 (BP Location: Right Arm, Patient Position: Sitting, Cuff Size: Normal)   Pulse 100   Temp 98.2 F (36.8 C) (Oral)   Wt 182 lb 2 oz (82.6 kg)   SpO2 93%   BMI 33.31 kg/m   BP/Weight 04/03/2016 03/17/2016 [id number]  Systolic BP [id number]  Diastolic BP 82 44 76  Wt. (Lbs) 182.13 184 184  BMI 33.31 33.65 33.65   Physical Exam  Constitutional: She is oriented to person, place, and time. She appears well-developed.  Audible wheezing noted. No apparent respiratory distress.   HENT:  Mouth/Throat: Oropharynx is clear and moist.    Cardiovascular: Normal rate and regular rhythm.   Pulmonary/Chest: Effort normal.  Diffuse expiratory wheezing.  Neurological: She is alert and oriented to person, place, and time.  Psychiatric: She has a normal mood and affect.  Vitals reviewed.  Lab Results  Component Value Date   WBC 8.0 11/27/2015   HGB 12.9 11/27/2015   HCT 39.1 11/27/2015   PLT 279.0 11/27/2015   GLUCOSE 112 (H) 11/27/2015   CHOL 234 (H) 11/27/2015   TRIG 178.0 (H) 11/27/2015   HDL 45.30 11/27/2015   LDLDIRECT 169.0 11/27/2015   LDLCALC 153 (H) 11/27/2015   ALT 51 (H) 11/27/2015   AST 36 11/27/2015   NA 137 11/27/2015   K 4.5 11/27/2015   CL 101 11/27/2015   CREATININE 0.85 11/27/2015   BUN 15 11/27/2015   CO2 27 11/27/2015   TSH 3.17 11/23/2013   HGBA1C 6.3 11/27/2015   MICROALBUR <0.7 11/27/2015    Assessment & Plan:   Problem List Items Addressed This Visit    Acute exacerbation of chronic obstructive pulmonary disease (COPD) ([place]) - Primary    Patient presenting with signs and symptoms of an acute COPD exacerbation. Breathing treatment given today given severe wheezing noted on exam. Patient had improvement following recent treatment. Treating with prednisone and doxycycline. Tussionex given for cough.      Relevant Medications   predniSONE (DELTASONE)  50 MG tablet   chlorpheniramine-HYDROcodone (TUSSIONEX PENNKINETIC ER) 10-8 MG/5ML SUER   albuterol (PROVENTIL) (2.5 MG/3ML) 0.083% nebulizer solution 2.5 mg (Completed)   albuterol (PROVENTIL) (2.5 MG/3ML) 0.083% nebulizer solution 2.5 mg (Completed)   ipratropium (ATROVENT) nebulizer solution 0.5 mg (Completed)    Other Visit Diagnoses   None.     Meds ordered this encounter  Medications  . predniSONE (DELTASONE) 50 MG tablet    Sig: 1 tablet daily x 5 days.    Dispense:  5 tablet    Refill:  0  . amoxicillin-clavulanate (AUGMENTIN) 875-125 MG tablet    Sig: Take 1 tablet by mouth 2 (two) times daily.    Dispense:  20 tablet     Refill:  0  . chlorpheniramine-HYDROcodone (TUSSIONEX PENNKINETIC ER) 10-8 MG/5ML SUER    Sig: Take 5 mLs by mouth every 12 (twelve) hours as needed.    Dispense:  115 mL    Refill:  0  . albuterol (PROVENTIL) (2.5 MG/3ML) 0.083% nebulizer solution 2.5 mg  . albuterol (PROVENTIL) (2.5 MG/3ML) 0.083% nebulizer solution 2.5 mg  . ipratropium (ATROVENT) nebulizer solution 0.5 mg   Follow-up: PRN  [place]

## 2016-04-06 DIAGNOSIS — C50411 Malignant neoplasm of upper-outer quadrant of right female breast: Secondary | ICD-10-CM | POA: Diagnosis not present

## 2016-04-06 DIAGNOSIS — Z171 Estrogen receptor negative status [ER-]: Secondary | ICD-10-CM | POA: Diagnosis not present

## 2016-04-08 DIAGNOSIS — Z79899 Other long term (current) drug therapy: Secondary | ICD-10-CM | POA: Diagnosis not present

## 2016-04-08 DIAGNOSIS — C50411 Malignant neoplasm of upper-outer quadrant of right female breast: Secondary | ICD-10-CM | POA: Diagnosis not present

## 2016-04-08 DIAGNOSIS — Z171 Estrogen receptor negative status [ER-]: Secondary | ICD-10-CM | POA: Diagnosis not present

## 2016-04-08 DIAGNOSIS — K7581 Nonalcoholic steatohepatitis (NASH): Secondary | ICD-10-CM | POA: Diagnosis not present

## 2016-04-11 ENCOUNTER — Other Ambulatory Visit: Payer: Self-pay | Admitting: Internal Medicine

## 2016-04-20 DIAGNOSIS — C50911 Malignant neoplasm of unspecified site of right female breast: Secondary | ICD-10-CM | POA: Diagnosis not present

## 2016-04-20 DIAGNOSIS — Z885 Allergy status to narcotic agent status: Secondary | ICD-10-CM | POA: Diagnosis not present

## 2016-04-20 DIAGNOSIS — Z452 Encounter for adjustment and management of vascular access device: Secondary | ICD-10-CM | POA: Diagnosis not present

## 2016-04-20 DIAGNOSIS — I517 Cardiomegaly: Secondary | ICD-10-CM | POA: Diagnosis not present

## 2016-04-20 DIAGNOSIS — I080 Rheumatic disorders of both mitral and aortic valves: Secondary | ICD-10-CM | POA: Diagnosis not present

## 2016-04-20 DIAGNOSIS — Z79899 Other long term (current) drug therapy: Secondary | ICD-10-CM | POA: Diagnosis not present

## 2016-04-27 DIAGNOSIS — Z171 Estrogen receptor negative status [ER-]: Secondary | ICD-10-CM | POA: Diagnosis not present

## 2016-04-27 DIAGNOSIS — Z5112 Encounter for antineoplastic immunotherapy: Secondary | ICD-10-CM | POA: Diagnosis not present

## 2016-04-27 DIAGNOSIS — C50411 Malignant neoplasm of upper-outer quadrant of right female breast: Secondary | ICD-10-CM | POA: Diagnosis not present

## 2016-04-27 DIAGNOSIS — Z5111 Encounter for antineoplastic chemotherapy: Secondary | ICD-10-CM | POA: Diagnosis not present

## 2016-05-04 DIAGNOSIS — Z171 Estrogen receptor negative status [ER-]: Secondary | ICD-10-CM | POA: Diagnosis not present

## 2016-05-04 DIAGNOSIS — Z87891 Personal history of nicotine dependence: Secondary | ICD-10-CM | POA: Diagnosis not present

## 2016-05-04 DIAGNOSIS — Z79899 Other long term (current) drug therapy: Secondary | ICD-10-CM | POA: Diagnosis not present

## 2016-05-04 DIAGNOSIS — J209 Acute bronchitis, unspecified: Secondary | ICD-10-CM | POA: Diagnosis not present

## 2016-05-04 DIAGNOSIS — R739 Hyperglycemia, unspecified: Secondary | ICD-10-CM | POA: Diagnosis not present

## 2016-05-04 DIAGNOSIS — Z9221 Personal history of antineoplastic chemotherapy: Secondary | ICD-10-CM | POA: Diagnosis not present

## 2016-05-04 DIAGNOSIS — Z885 Allergy status to narcotic agent status: Secondary | ICD-10-CM | POA: Diagnosis not present

## 2016-05-04 DIAGNOSIS — Z5111 Encounter for antineoplastic chemotherapy: Secondary | ICD-10-CM | POA: Diagnosis not present

## 2016-05-04 DIAGNOSIS — C50411 Malignant neoplasm of upper-outer quadrant of right female breast: Secondary | ICD-10-CM | POA: Diagnosis not present

## 2016-05-11 DIAGNOSIS — C50411 Malignant neoplasm of upper-outer quadrant of right female breast: Secondary | ICD-10-CM | POA: Diagnosis not present

## 2016-05-11 DIAGNOSIS — Z171 Estrogen receptor negative status [ER-]: Secondary | ICD-10-CM | POA: Diagnosis not present

## 2016-05-11 DIAGNOSIS — Z5111 Encounter for antineoplastic chemotherapy: Secondary | ICD-10-CM | POA: Diagnosis not present

## 2016-05-18 DIAGNOSIS — Z5112 Encounter for antineoplastic immunotherapy: Secondary | ICD-10-CM | POA: Diagnosis not present

## 2016-05-18 DIAGNOSIS — Z87891 Personal history of nicotine dependence: Secondary | ICD-10-CM | POA: Diagnosis not present

## 2016-05-18 DIAGNOSIS — Z171 Estrogen receptor negative status [ER-]: Secondary | ICD-10-CM | POA: Diagnosis not present

## 2016-05-18 DIAGNOSIS — I2583 Coronary atherosclerosis due to lipid rich plaque: Secondary | ICD-10-CM | POA: Diagnosis not present

## 2016-05-18 DIAGNOSIS — K760 Fatty (change of) liver, not elsewhere classified: Secondary | ICD-10-CM | POA: Diagnosis not present

## 2016-05-18 DIAGNOSIS — Z79899 Other long term (current) drug therapy: Secondary | ICD-10-CM | POA: Diagnosis not present

## 2016-05-18 DIAGNOSIS — R232 Flushing: Secondary | ICD-10-CM | POA: Diagnosis not present

## 2016-05-18 DIAGNOSIS — T451X5A Adverse effect of antineoplastic and immunosuppressive drugs, initial encounter: Secondary | ICD-10-CM | POA: Diagnosis not present

## 2016-05-18 DIAGNOSIS — Z5111 Encounter for antineoplastic chemotherapy: Secondary | ICD-10-CM | POA: Diagnosis not present

## 2016-05-18 DIAGNOSIS — R197 Diarrhea, unspecified: Secondary | ICD-10-CM | POA: Diagnosis not present

## 2016-05-18 DIAGNOSIS — C50911 Malignant neoplasm of unspecified site of right female breast: Secondary | ICD-10-CM | POA: Diagnosis not present

## 2016-05-18 DIAGNOSIS — C50511 Malignant neoplasm of lower-outer quadrant of right female breast: Secondary | ICD-10-CM | POA: Diagnosis not present

## 2016-05-18 DIAGNOSIS — J449 Chronic obstructive pulmonary disease, unspecified: Secondary | ICD-10-CM | POA: Diagnosis not present

## 2016-05-18 DIAGNOSIS — L658 Other specified nonscarring hair loss: Secondary | ICD-10-CM | POA: Diagnosis not present

## 2016-05-18 DIAGNOSIS — Z9011 Acquired absence of right breast and nipple: Secondary | ICD-10-CM | POA: Diagnosis not present

## 2016-05-18 DIAGNOSIS — R252 Cramp and spasm: Secondary | ICD-10-CM | POA: Diagnosis not present

## 2016-05-18 DIAGNOSIS — G589 Mononeuropathy, unspecified: Secondary | ICD-10-CM | POA: Diagnosis not present

## 2016-05-19 ENCOUNTER — Ambulatory Visit (INDEPENDENT_AMBULATORY_CARE_PROVIDER_SITE_OTHER): Payer: Medicare Other | Admitting: Pulmonary Disease

## 2016-05-19 ENCOUNTER — Encounter: Payer: Self-pay | Admitting: Pulmonary Disease

## 2016-05-19 VITALS — BP 126/70 | HR 82 | Ht 62.0 in | Wt 183.0 lb

## 2016-05-19 DIAGNOSIS — J441 Chronic obstructive pulmonary disease with (acute) exacerbation: Secondary | ICD-10-CM | POA: Diagnosis not present

## 2016-05-19 DIAGNOSIS — J449 Chronic obstructive pulmonary disease, unspecified: Secondary | ICD-10-CM | POA: Diagnosis not present

## 2016-05-19 DIAGNOSIS — I2510 Atherosclerotic heart disease of native coronary artery without angina pectoris: Secondary | ICD-10-CM | POA: Diagnosis not present

## 2016-05-19 NOTE — Patient Instructions (Signed)
1) Continue current medications 2) Follow up in 4 weeks with chest Xray and repeat lung function tests prior

## 2016-05-19 NOTE — Progress Notes (Signed)
PROBLEMS: Mild COPD with asthmatic component  INTERVAL HISTORY: Diagnosed with R breast cancer 02/24/16. Underwent partial mastectomy 03/27/16. Suffered AECOPD 04/03/16 treated with 2 rounds of prednisone and antibiotics.   SUBJ: As above. She is now approx 90% back to her baseline. Remains on Breo and PRN albuterol. Uses albuterol MDI every morning upon awakening and otherwise very rarely as a rescue inhaler. Presently on Taxol and herceptin for breast cancer. XRT planned for net month  OBJ: Vitals:   05/19/16 1501  BP: 126/70  Pulse: 82  SpO2: 99%  Weight: 183 lb (83 kg)  Height: 5\' 2"  (1.575 m)   NAD @ rest HEENT WNL No JVD BS mildly diminished, no wheezes RRR s M NABS, soft No C/C/E  Current Outpatient Prescriptions on File Prior to Visit  Medication Sig Dispense Refill  . albuterol (PROVENTIL HFA;VENTOLIN HFA) 108 (90 Base) MCG/ACT inhaler Pt uses 2 puff qam  Only.   Give qty sufficient for 90 days 3 Inhaler 3  . aspirin 81 MG tablet Take 81 mg by mouth daily.    Marland Kitchen BREO ELLIPTA 100-25 MCG/INH AEPB USE 1 INHALATION DAILY 180 each 2  . Calcium-Vitamin D-Vitamin K (CALCIUM SOFT CHEWS PO) Take by mouth daily.    . Cholecalciferol (VITAMIN D-3) 1000 UNITS CAPS Take 1 capsule by mouth daily.    Marland Kitchen desloratadine (CLARINEX) 5 MG tablet Take 1 tablet (5 mg total) by mouth daily. 90 tablet 3  . famotidine (PEPCID) 40 MG tablet Take 40 mg by mouth daily.    . furosemide (LASIX) 20 MG tablet take 1 tablet by mouth once daily if needed 30 tablet 3  . hydrocortisone (ANUSOL-HC) 25 MG suppository Place 1 suppository (25 mg total) rectally 2 (two) times daily. 12 suppository 0  . losartan (COZAAR) 50 MG tablet Take 1 tablet (50 mg total) by mouth daily. 90 tablet 3  . montelukast (SINGULAIR) 10 MG tablet TAKE 1 TABLET AT BEDTIME 90 tablet 2  . Multiple Vitamins-Minerals (CENTRUM SILVER PO) Take by mouth. Take 1 am     . olopatadine (PATANOL) 0.1 % ophthalmic solution Place 1 drop into  both eyes as needed.     . pantoprazole (PROTONIX) 40 MG tablet Take 1 tablet (40 mg total) by mouth daily. 90 tablet 3  . Polyethyl Glycol-Propyl Glycol (SYSTANE OP) Apply to eye. 1-2 drops in each eye as needed.    . vitamin C (ASCORBIC ACID) 500 MG tablet Take 500 mg by mouth daily.    . vitamin E (VITAMIN E) 400 UNIT capsule Take 400 Units by mouth daily.     No current facility-administered medications on file prior to visit.      DATA: No new data  IMPRESSION: 1) Mild COPD  2) Recent AECOPD - now approaching baseline 3) recent dx of breast cancer  PLAN: Continue Breo and PRN albuterol ROV 4 weeks with CXR prior and office spirometry day of ROV  Merton Border, MD PCCM service Mobile [phone] Pager [phone] 05/19/2016

## 2016-05-25 DIAGNOSIS — Z5111 Encounter for antineoplastic chemotherapy: Secondary | ICD-10-CM | POA: Diagnosis not present

## 2016-05-25 DIAGNOSIS — C50411 Malignant neoplasm of upper-outer quadrant of right female breast: Secondary | ICD-10-CM | POA: Diagnosis not present

## 2016-05-25 DIAGNOSIS — Z171 Estrogen receptor negative status [ER-]: Secondary | ICD-10-CM | POA: Diagnosis not present

## 2016-05-28 ENCOUNTER — Encounter: Payer: Self-pay | Admitting: Internal Medicine

## 2016-05-28 ENCOUNTER — Ambulatory Visit (INDEPENDENT_AMBULATORY_CARE_PROVIDER_SITE_OTHER): Payer: Medicare Other | Admitting: Internal Medicine

## 2016-05-28 VITALS — BP 138/60 | HR 84 | Temp 98.0°F | Resp 12 | Ht 62.0 in | Wt 181.2 lb

## 2016-05-28 DIAGNOSIS — K7581 Nonalcoholic steatohepatitis (NASH): Secondary | ICD-10-CM

## 2016-05-28 DIAGNOSIS — E119 Type 2 diabetes mellitus without complications: Secondary | ICD-10-CM

## 2016-05-28 DIAGNOSIS — C50411 Malignant neoplasm of upper-outer quadrant of right female breast: Secondary | ICD-10-CM | POA: Diagnosis not present

## 2016-05-28 DIAGNOSIS — I2510 Atherosclerotic heart disease of native coronary artery without angina pectoris: Secondary | ICD-10-CM

## 2016-05-28 DIAGNOSIS — Z171 Estrogen receptor negative status [ER-]: Secondary | ICD-10-CM | POA: Diagnosis not present

## 2016-05-28 NOTE — Progress Notes (Signed)
Pre-visit discussion using our clinic review tool. No additional management support is needed unless otherwise documented below in the visit note.  

## 2016-05-28 NOTE — Patient Instructions (Signed)
I'm glad you are doing  So well  Remember to avoid doong too much on the days you feel well.  Better to be able to maintain enough energy to go for A DAILY WALK to keep you leg muscles strong

## 2016-05-28 NOTE — Progress Notes (Signed)
Subjective:  Patient ID: Traci Hall, female    DOB: Jan 04, 1946  Age: 70 y.o. MRN: [id number]  CC: The primary encounter diagnosis was Controlled type 2 diabetes mellitus without complication, unspecified long term insulin use status ([place]). Diagnoses of NASH (nonalcoholic steatohepatitis) and Malignant neoplasm of upper-outer quadrant of right breast in female, estrogen receptor negative ([place]) were also pertinent to this visit.  HPI [facility] presents for FOLLOW UP on multiple issues, including recent diagnosis of breast cancer  UNDERGOING CHEMOTHERAPY FOR RIGHT SIDED BREAST CANCER DIAGNOSED SEPT 2017 S/ PARTAL MASTECTOMY AND SLN BIOPSY .  Path report INVASIVE PAPILLARY CA . ER negative .   She has been undergoing treatment with the protocol for infiltrating ductal carcinoma by cancer center  at  [facility]. CYCLE 2 OF TAXOL /TRASTUZUMAB.   ECHO was normal.  Her last treament, started in November,  Was Dec 11,  TAXOL  AND DEXAMETHASONE.  Has not lost her hair,  Appetite good,  Energy level drops significantly on day 3, tends to overdo int on ay 2 since she feels good,  .Has noticed increased recurrence of bronchitis, community acquired infections    Copd: SAW SIMONDS, DEC 5TH,  FOR PERSISTENT COPD.  PFTS and 4 WEEK FOLLOW UP,  DRY SKIN      Outpatient Medications Prior to Visit  Medication Sig Dispense Refill  . albuterol (PROVENTIL HFA;VENTOLIN HFA) 108 (90 Base) MCG/ACT inhaler Pt uses 2 puff qam  Only.   Give qty sufficient for 90 days 3 Inhaler 3  . aspirin 81 MG tablet Take 81 mg by mouth daily.    Marland Kitchen BREO ELLIPTA 100-25 MCG/INH AEPB USE 1 INHALATION DAILY 180 each 2  . Calcium-Vitamin D-Vitamin K (CALCIUM SOFT CHEWS PO) Take by mouth daily.    . Cholecalciferol (VITAMIN D-3) 1000 UNITS CAPS Take 1 capsule by mouth daily.    Marland Kitchen desloratadine (CLARINEX) 5 MG tablet Take 1 tablet (5 mg total) by mouth daily. 90 tablet 3  . famotidine (PEPCID) 40 MG tablet Take 40 mg by mouth daily.     . furosemide (LASIX) 20 MG tablet take 1 tablet by mouth once daily if needed 30 tablet 3  . hydrocortisone (ANUSOL-HC) 25 MG suppository Place 1 suppository (25 mg total) rectally 2 (two) times daily. 12 suppository 0  . losartan (COZAAR) 50 MG tablet Take 1 tablet (50 mg total) by mouth daily. 90 tablet 3  . montelukast (SINGULAIR) 10 MG tablet TAKE 1 TABLET AT BEDTIME 90 tablet 2  . Multiple Vitamins-Minerals (CENTRUM SILVER PO) Take by mouth. Take 1 am     . olopatadine (PATANOL) 0.1 % ophthalmic solution Place 1 drop into both eyes as needed.     . pantoprazole (PROTONIX) 40 MG tablet Take 1 tablet (40 mg total) by mouth daily. 90 tablet 3  . Polyethyl Glycol-Propyl Glycol (SYSTANE OP) Apply to eye. 1-2 drops in each eye as needed.    . vitamin C (ASCORBIC ACID) 500 MG tablet Take 500 mg by mouth daily.    . vitamin E (VITAMIN E) 400 UNIT capsule Take 400 Units by mouth daily.     No facility-administered medications prior to visit.     Review of Systems;  Patient denies headache, fevers, malaise, unintentional weight loss, skin rash, eye pain, sinus congestion and sinus pain, sore throat, dysphagia,  hemoptysis ,dyspnea, wheezing, chest pain, palpitations, orthopnea, edema, abdominal pain, nausea, melena, diarrhea, constipation, flank pain, dysuria, hematuria, urinary  Frequency, nocturia, numbness, tingling,  seizures,  Focal weakness, Loss of consciousness,  Tremor, insomnia, depression, anxiety, and suicidal ideation.      Objective:  BP 138/60   Pulse 84   Temp 98 F (36.7 C) (Oral)   Resp 12   Ht 5\' 2"  (1.575 m)   Wt 181 lb 4 oz (82.2 kg)   SpO2 98%   BMI 33.15 kg/m   BP Readings from Last 3 Encounters:  05/28/16 138/60  05/19/16 126/70  04/03/16 140/82    Wt Readings from Last 3 Encounters:  05/28/16 181 lb 4 oz (82.2 kg)  05/19/16 183 lb (83 kg)  04/03/16 182 lb 2 oz (82.6 kg)    General appearance: alert, cooperative and appears stated age Ears: normal  TM's and external ear canals both ears Throat: lips, mucosa, and tongue normal; teeth and gums normal Neck: no adenopathy, no carotid bruit, supple, symmetrical, trachea midline and thyroid not enlarged, symmetric, no tenderness/mass/nodules Back: symmetric, no curvature. ROM normal. No CVA tenderness. Lungs: clear to auscultation bilaterally Heart: regular rate and rhythm, S1, S2 normal, no murmur, click, rub or gallop Abdomen: soft, non-tender; bowel sounds normal; no masses,  no organomegaly Pulses: 2+ and symmetric Skin: Skin color, texture, turgor normal. No rashes or lesions Lymph nodes: Cervical, supraclavicular, and axillary nodes normal.  Lab Results  Component Value Date   HGBA1C 6.3 11/27/2015   HGBA1C 6.2 04/22/2015   HGBA1C 6.2 09/21/2014    Lab Results  Component Value Date   CREATININE 0.85 11/27/2015   CREATININE 0.75 08/08/2015   CREATININE 0.77 04/22/2015    Lab Results  Component Value Date   WBC 8.0 11/27/2015   HGB 12.9 11/27/2015   HCT 39.1 11/27/2015   PLT 279.0 11/27/2015   GLUCOSE 112 (H) 11/27/2015   CHOL 234 (H) 11/27/2015   TRIG 178.0 (H) 11/27/2015   HDL 45.30 11/27/2015   LDLDIRECT 169.0 11/27/2015   LDLCALC 153 (H) 11/27/2015   ALT 51 (H) 11/27/2015   AST 36 11/27/2015   NA 137 11/27/2015   K 4.5 11/27/2015   CL 101 11/27/2015   CREATININE 0.85 11/27/2015   BUN 15 11/27/2015   CO2 27 11/27/2015   TSH 3.17 11/23/2013   HGBA1C 6.3 11/27/2015   MICROALBUR <0.7 11/27/2015    Mm Rt Breast Bx W Loc Dev 1st Lesion Image Bx Spec Stereo Guide  Result Date: 02/24/2016 70 year old woman with recently identified microcalcifications involving the upper-outer quadrant of the right breast. Candidate for stereotactic biopsy. The procedure was reviewed with the patient. She was amenable to proceed. Timeout performed. The skin was cleansed with ChloraPrep. 5 mL of 1% Xylocaine with 1-200,000 epinephrine was used for initial local anesthetic and  tolerated well. An additional 20 mL of the medication was used during the procedure. Steritapes images were obtained. Pre-and post-fire images show the area of prominent calcifications noted. The middle/posterior aspect of the calcifications were targeted. 12 core samples were obtained. Modest discomfort during the procedure which quickly resolved. No bleeding. Specimen radiograph confirmed numerous calcifications in multiple cores. A marking clip was placed. Skin defect closed with benzoin and Steri-Strips. Post biopsy mammograms pending.   Assessment & Plan:   Problem List Items Addressed This Visit    DM II (diabetes mellitus, type II), controlled ([place]) - Primary    Diagnosed with fasting  glucose of 140 in May 2014. A1cs have been < 6.5  on diet alone and are being checked regularly. Patient is up-to-date on annual eye exams and has no  neuropathy or  proteinuria  .  Fasting lipids are elevated; statin therapy has been prescribed in the past but not tolerated due to recurrent arthralgias. Currently tolerating Zetia for management of coronary atherosclerosis.  Encouraged to continue healthy lifestyle  Including low GI diet,  regular exercise program, with goal wt loss of 20 lbs over next 6 months    Lab Results  Component Value Date   HGBA1C 6.3 11/27/2015   Lab Results  Component Value Date   MICROALBUR <0.7 11/27/2015   Lab Results  Component Value Date   CHOL 234 (H) 11/27/2015   HDL 45.30 11/27/2015   LDLCALC 153 (H) 11/27/2015   LDLDIRECT 169.0 11/27/2015   TRIG 178.0 (H) 11/27/2015   CHOLHDL 5 11/27/2015                     Relevant Orders   Hemoglobin A1c   Microalbumin / creatinine urine ratio   Malignant neoplasm of upper-outer quadrant of right female breast ([place])    S/p partial mastectomy with SLN,  Invasive papillary ca, treated at [facility] with invasive mammary ca protocol with taxol and trastuzumab.        NASH (nonalcoholic steatohepatitis)    . Continue  lifestyle modfications with goal weight loss of 10% over the next 6 months using exercise and a low glycemic index diet. She is due for liver enzymes and annual ultrasound   Lab Results  Component Value Date   ALT 51 (H) 11/27/2015   AST 36 11/27/2015   ALKPHOS 81 11/27/2015   BILITOT 0.5 11/27/2015         Relevant Orders   Comprehensive metabolic panel   LDL cholesterol, direct   Lipid panel   US Abdomen Limited      I am having [facility]. [facility] maintain her aspirin, olopatadine, Multiple Vitamins-Minerals (CENTRUM SILVER PO), vitamin C, vitamin E, Polyethyl Glycol-Propyl Glycol (SYSTANE OP), famotidine, Vitamin D-3, hydrocortisone, Calcium-Vitamin D-Vitamin K (CALCIUM SOFT CHEWS PO), furosemide, desloratadine, losartan, albuterol, pantoprazole, montelukast, and BREO ELLIPTA.  No orders of the defined types were placed in this encounter.   There are no discontinued medications.  Follow-up: Return in about 6 months (around 11/26/2016).   [name], MD

## 2016-05-31 NOTE — Assessment & Plan Note (Signed)
Diagnosed with fasting  glucose of 140 in May 2014. A1cs have been < 6.5  on diet alone and are being checked regularly. Patient is up-to-date on annual eye exams and has no neuropathy or  proteinuria  .  Fasting lipids are elevated; statin therapy has been prescribed in the past but not tolerated due to recurrent arthralgias. Currently tolerating Zetia for management of coronary atherosclerosis.  Encouraged to continue healthy lifestyle  Including low GI diet,  regular exercise program, with goal wt loss of 20 lbs over next 6 months    Lab Results  Component Value Date   HGBA1C 6.3 11/27/2015   Lab Results  Component Value Date   MICROALBUR <0.7 11/27/2015   Lab Results  Component Value Date   CHOL 234 (H) 11/27/2015   HDL 45.30 11/27/2015   LDLCALC 153 (H) 11/27/2015   LDLDIRECT 169.0 11/27/2015   TRIG 178.0 (H) 11/27/2015   CHOLHDL 5 11/27/2015

## 2016-05-31 NOTE — Assessment & Plan Note (Signed)
.   Continue lifestyle modfications with goal weight loss of 10% over the next 6 months using exercise and a low glycemic index diet. She is due for liver enzymes and annual ultrasound   Lab Results  Component Value Date   ALT 51 (H) 11/27/2015   AST 36 11/27/2015   ALKPHOS 81 11/27/2015   BILITOT 0.5 11/27/2015

## 2016-05-31 NOTE — Assessment & Plan Note (Addendum)
S/p partial mastectomy with SLN,  Invasive papillary ca, treated at [facility] with invasive mammary ca protocol with taxol and trastuzumab.

## 2016-06-01 DIAGNOSIS — Z5111 Encounter for antineoplastic chemotherapy: Secondary | ICD-10-CM | POA: Diagnosis not present

## 2016-06-01 DIAGNOSIS — Z79899 Other long term (current) drug therapy: Secondary | ICD-10-CM | POA: Diagnosis not present

## 2016-06-01 DIAGNOSIS — C50411 Malignant neoplasm of upper-outer quadrant of right female breast: Secondary | ICD-10-CM | POA: Diagnosis not present

## 2016-06-01 DIAGNOSIS — Z171 Estrogen receptor negative status [ER-]: Secondary | ICD-10-CM | POA: Diagnosis not present

## 2016-06-05 ENCOUNTER — Encounter: Payer: Self-pay | Admitting: Internal Medicine

## 2016-06-05 ENCOUNTER — Ambulatory Visit
Admission: RE | Admit: 2016-06-05 | Discharge: 2016-06-05 | Disposition: A | Discharge status: Home / Self Care | Payer: Medicare Other | Source: Ambulatory Visit | Attending: Internal Medicine | Admitting: Internal Medicine

## 2016-06-05 DIAGNOSIS — Z853 Personal history of malignant neoplasm of breast: Secondary | ICD-10-CM | POA: Insufficient documentation

## 2016-06-05 DIAGNOSIS — K760 Fatty (change of) liver, not elsewhere classified: Secondary | ICD-10-CM | POA: Diagnosis not present

## 2016-06-05 DIAGNOSIS — C50919 Malignant neoplasm of unspecified site of unspecified female breast: Secondary | ICD-10-CM | POA: Diagnosis not present

## 2016-06-05 DIAGNOSIS — K7581 Nonalcoholic steatohepatitis (NASH): Secondary | ICD-10-CM | POA: Diagnosis not present

## 2016-06-10 DIAGNOSIS — Z170 Estrogen receptor positive status [ER+]: Secondary | ICD-10-CM | POA: Diagnosis not present

## 2016-06-10 DIAGNOSIS — Z171 Estrogen receptor negative status [ER-]: Secondary | ICD-10-CM | POA: Diagnosis not present

## 2016-06-10 DIAGNOSIS — R0602 Shortness of breath: Secondary | ICD-10-CM | POA: Diagnosis not present

## 2016-06-10 DIAGNOSIS — Z5112 Encounter for antineoplastic immunotherapy: Secondary | ICD-10-CM | POA: Diagnosis not present

## 2016-06-10 DIAGNOSIS — C50411 Malignant neoplasm of upper-outer quadrant of right female breast: Secondary | ICD-10-CM | POA: Diagnosis not present

## 2016-06-12 ENCOUNTER — Encounter: Payer: Self-pay | Admitting: Pulmonary Disease

## 2016-06-12 NOTE — Telephone Encounter (Signed)
DS please advise. Thanks.  

## 2016-06-17 DIAGNOSIS — K219 Gastro-esophageal reflux disease without esophagitis: Secondary | ICD-10-CM | POA: Diagnosis not present

## 2016-06-17 DIAGNOSIS — Z5111 Encounter for antineoplastic chemotherapy: Secondary | ICD-10-CM | POA: Diagnosis not present

## 2016-06-17 DIAGNOSIS — M7989 Other specified soft tissue disorders: Secondary | ICD-10-CM | POA: Diagnosis not present

## 2016-06-17 DIAGNOSIS — Z7951 Long term (current) use of inhaled steroids: Secondary | ICD-10-CM | POA: Diagnosis not present

## 2016-06-17 DIAGNOSIS — L659 Nonscarring hair loss, unspecified: Secondary | ICD-10-CM | POA: Diagnosis not present

## 2016-06-17 DIAGNOSIS — I2510 Atherosclerotic heart disease of native coronary artery without angina pectoris: Secondary | ICD-10-CM | POA: Diagnosis not present

## 2016-06-17 DIAGNOSIS — Z889 Allergy status to unspecified drugs, medicaments and biological substances status: Secondary | ICD-10-CM | POA: Diagnosis not present

## 2016-06-17 DIAGNOSIS — E7800 Pure hypercholesterolemia, unspecified: Secondary | ICD-10-CM | POA: Diagnosis not present

## 2016-06-17 DIAGNOSIS — Z171 Estrogen receptor negative status [ER-]: Secondary | ICD-10-CM | POA: Diagnosis not present

## 2016-06-17 DIAGNOSIS — I10 Essential (primary) hypertension: Secondary | ICD-10-CM | POA: Diagnosis not present

## 2016-06-17 DIAGNOSIS — R21 Rash and other nonspecific skin eruption: Secondary | ICD-10-CM | POA: Diagnosis not present

## 2016-06-17 DIAGNOSIS — Z87891 Personal history of nicotine dependence: Secondary | ICD-10-CM | POA: Diagnosis not present

## 2016-06-17 DIAGNOSIS — Z79899 Other long term (current) drug therapy: Secondary | ICD-10-CM | POA: Diagnosis not present

## 2016-06-17 DIAGNOSIS — J449 Chronic obstructive pulmonary disease, unspecified: Secondary | ICD-10-CM | POA: Diagnosis not present

## 2016-06-17 DIAGNOSIS — Z7982 Long term (current) use of aspirin: Secondary | ICD-10-CM | POA: Diagnosis not present

## 2016-06-17 DIAGNOSIS — C50411 Malignant neoplasm of upper-outer quadrant of right female breast: Secondary | ICD-10-CM | POA: Diagnosis not present

## 2016-06-17 DIAGNOSIS — M19042 Primary osteoarthritis, left hand: Secondary | ICD-10-CM | POA: Diagnosis not present

## 2016-06-17 DIAGNOSIS — R232 Flushing: Secondary | ICD-10-CM | POA: Diagnosis not present

## 2016-06-17 DIAGNOSIS — R7989 Other specified abnormal findings of blood chemistry: Secondary | ICD-10-CM | POA: Diagnosis not present

## 2016-06-18 ENCOUNTER — Encounter: Payer: Self-pay | Admitting: Pulmonary Disease

## 2016-06-18 ENCOUNTER — Ambulatory Visit (INDEPENDENT_AMBULATORY_CARE_PROVIDER_SITE_OTHER): Payer: Medicare Other | Admitting: Pulmonary Disease

## 2016-06-18 VITALS — BP 152/92 | HR 109 | Wt 183.0 lb

## 2016-06-18 DIAGNOSIS — J449 Chronic obstructive pulmonary disease, unspecified: Secondary | ICD-10-CM | POA: Diagnosis not present

## 2016-06-18 DIAGNOSIS — R0609 Other forms of dyspnea: Secondary | ICD-10-CM

## 2016-06-18 DIAGNOSIS — R011 Cardiac murmur, unspecified: Secondary | ICD-10-CM

## 2016-06-18 DIAGNOSIS — R000 Tachycardia, unspecified: Secondary | ICD-10-CM | POA: Diagnosis not present

## 2016-06-18 MED ORDER — PREDNISONE 20 MG PO TABS: 40.0000 mg | ORAL_TABLET | Freq: Every day | ORAL | 0 refills | Status: AC

## 2016-06-18 NOTE — Patient Instructions (Addendum)
Prednisone 40 mg daily for 5 days  Please have a copy of the echocardiogram planned for next Monday sent to me  Follow up 07/11/15

## 2016-06-18 NOTE — Progress Notes (Signed)
PROBLEMS: Mild COPD with asthmatic component  DATA: PFTs 12/31/13: normal  INTERVAL HISTORY: Continues on chemotherapy for recent dx of breast cancer - Taxol and herceptin  SUBJ: 2 weeks of increased dyspnea and mild cough. Describes DOE with daily activities and slight inclines. Denies CP, fever, purulent sputum, hemoptysis, LE edema and calf tenderness. Used albuterol MDI 2X this AM. Was treated X 5 days with prednisone by oncologist for these symptoms with modest improvement. 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 albuterol (PROVENTIL HFA;VENTOLIN HFA) 108 (90 Base) MCG/ACT inhaler Pt uses 2 puff qam  Only.   Give qty sufficient for 90 days 3 Inhaler 3  . aspirin 81 MG tablet Take 81 mg by mouth daily.    Marland Kitchen BREO ELLIPTA 100-25 MCG/INH AEPB USE 1 INHALATION DAILY 180 each 2  . Cholecalciferol (VITAMIN D-3) 1000 UNITS CAPS Take 1 capsule by mouth daily.    Marland Kitchen desloratadine (CLARINEX) 5 MG tablet Take 1 tablet (5 mg total) by mouth daily. 90 tablet 3  . famotidine (PEPCID) 40 MG tablet Take 40 mg by mouth daily.    . furosemide (LASIX) 20 MG tablet take 1 tablet by mouth once daily if needed 30 tablet 3  . hydrocortisone (ANUSOL-HC) 25 MG suppository Place 1 suppository (25 mg total) rectally 2 (two) times daily. 12 suppository 0  . losartan (COZAAR) 50 MG tablet Take 1 tablet (50 mg total) by mouth daily. 90 tablet 3  . montelukast (SINGULAIR) 10 MG tablet TAKE 1 TABLET AT BEDTIME 90 tablet 2  . Multiple Vitamins-Minerals (CENTRUM SILVER PO) Take by mouth. Take 1 am     . olopatadine (PATANOL) 0.1 % ophthalmic solution Place 1 drop into  both eyes as needed.     . pantoprazole (PROTONIX) 40 MG tablet Take 1 tablet (40 mg total) by mouth daily. 90 tablet 3  . Polyethyl Glycol-Propyl Glycol (SYSTANE OP) Apply to eye. 1-2 drops in each eye as needed.    . vitamin C (ASCORBIC ACID) 500 MG tablet Take 500 mg by mouth daily.    . vitamin E (VITAMIN E) 400 UNIT capsule Take 400 Units by mouth daily.     No current facility-administered medications on file prior to visit.      DATA: No new data  IMPRESSION: 1) Mild COPD @ baseline 2) Increased exertional dyspnea with minimal wheeze - I am not convinced that her symptoms are entirely attributable to airways disease.  3) Tachycardia, new finding of heart murmur c/w MR - she has an echocardiogram planned 06/22/16 @ [facility]  PLAN: Continue Breo and PRN albuterol Prednisone 40 mg daily for 5 days Please have a copy of the echocardiogram planned for next Monday sent to me Follow up 07/11/15 - Mat overbook  Merton Border, MD PCCM service Mobile [phone] Pager [phone] 06/18/2016

## 2016-06-22 DIAGNOSIS — C50411 Malignant neoplasm of upper-outer quadrant of right female breast: Secondary | ICD-10-CM | POA: Diagnosis not present

## 2016-06-22 DIAGNOSIS — I340 Nonrheumatic mitral (valve) insufficiency: Secondary | ICD-10-CM | POA: Diagnosis not present

## 2016-06-22 DIAGNOSIS — I10 Essential (primary) hypertension: Secondary | ICD-10-CM | POA: Diagnosis not present

## 2016-06-22 DIAGNOSIS — Z171 Estrogen receptor negative status [ER-]: Secondary | ICD-10-CM | POA: Diagnosis not present

## 2016-06-25 ENCOUNTER — Encounter: Payer: Self-pay | Admitting: Pulmonary Disease

## 2016-06-25 ENCOUNTER — Encounter: Payer: Self-pay | Admitting: Cardiovascular Disease

## 2016-06-30 DIAGNOSIS — Z5112 Encounter for antineoplastic immunotherapy: Secondary | ICD-10-CM | POA: Diagnosis not present

## 2016-06-30 DIAGNOSIS — Z171 Estrogen receptor negative status [ER-]: Secondary | ICD-10-CM | POA: Diagnosis not present

## 2016-06-30 DIAGNOSIS — Z79899 Other long term (current) drug therapy: Secondary | ICD-10-CM | POA: Diagnosis not present

## 2016-06-30 DIAGNOSIS — Z5111 Encounter for antineoplastic chemotherapy: Secondary | ICD-10-CM | POA: Diagnosis not present

## 2016-06-30 DIAGNOSIS — C50411 Malignant neoplasm of upper-outer quadrant of right female breast: Secondary | ICD-10-CM | POA: Diagnosis not present

## 2016-07-06 DIAGNOSIS — Z171 Estrogen receptor negative status [ER-]: Secondary | ICD-10-CM | POA: Diagnosis not present

## 2016-07-06 DIAGNOSIS — Z5111 Encounter for antineoplastic chemotherapy: Secondary | ICD-10-CM | POA: Diagnosis not present

## 2016-07-06 DIAGNOSIS — C50411 Malignant neoplasm of upper-outer quadrant of right female breast: Secondary | ICD-10-CM | POA: Diagnosis not present

## 2016-07-08 ENCOUNTER — Other Ambulatory Visit: Payer: Self-pay | Admitting: Cardiovascular Disease

## 2016-07-10 ENCOUNTER — Encounter: Payer: Self-pay | Admitting: Pulmonary Disease

## 2016-07-10 ENCOUNTER — Ambulatory Visit (INDEPENDENT_AMBULATORY_CARE_PROVIDER_SITE_OTHER): Payer: Medicare Other | Admitting: Pulmonary Disease

## 2016-07-10 ENCOUNTER — Ambulatory Visit: Payer: Medicare Other | Admitting: Pulmonary Disease

## 2016-07-10 ENCOUNTER — Encounter: Payer: Self-pay | Admitting: Cardiovascular Disease

## 2016-07-10 ENCOUNTER — Ambulatory Visit (INDEPENDENT_AMBULATORY_CARE_PROVIDER_SITE_OTHER): Payer: Medicare Other | Admitting: Cardiovascular Disease

## 2016-07-10 VITALS — BP 132/80 | HR 114 | Wt 180.0 lb

## 2016-07-10 VITALS — BP 156/82 | HR 88 | Ht 62.0 in | Wt 180.5 lb

## 2016-07-10 DIAGNOSIS — J449 Chronic obstructive pulmonary disease, unspecified: Secondary | ICD-10-CM | POA: Diagnosis not present

## 2016-07-10 DIAGNOSIS — I10 Essential (primary) hypertension: Secondary | ICD-10-CM | POA: Diagnosis not present

## 2016-07-10 DIAGNOSIS — E785 Hyperlipidemia, unspecified: Secondary | ICD-10-CM

## 2016-07-10 DIAGNOSIS — I2510 Atherosclerotic heart disease of native coronary artery without angina pectoris: Secondary | ICD-10-CM | POA: Diagnosis not present

## 2016-07-10 MED ORDER — LOSARTAN POTASSIUM 100 MG PO TABS: 100.0000 mg | ORAL_TABLET | Freq: Every day | ORAL | 3 refills | Status: DC

## 2016-07-10 NOTE — Patient Instructions (Signed)
Medication Instructions:  Your physician has recommended you make the following change in your medication:  INCREASE losartan to 100mg  once daily   Labwork: none  Testing/Procedures: none  Follow-Up: Your physician wants you to follow-up in: 6 months with Dr. Fletcher Anon.  You will receive a reminder letter in the mail two months in advance. If you don't receive a letter, please call our office to schedule the follow-up appointment.   Any Other Special Instructions Will Be Listed Below (If Applicable).     If you need a refill on your cardiac medications before your next appointment, please call your pharmacy.

## 2016-07-10 NOTE — Patient Instructions (Signed)
Continue same medications Follow up in 3 months Call sooner as needed

## 2016-07-10 NOTE — Progress Notes (Signed)
Cardiology Office Note   Date:  07/10/2016   ID:  Traci Hall, [name] 21-Feb-1946, MRN [id number]  PCP:  Traci Mc, MD  Cardiologist:   [name], MD   Chief Complaint  Patient presents with  . other    1 yr f/u c/o sob and flunctuating BP. Pt had echo/labs at [facility] 06/22/16. Meds reviewed verbally with pt.      History of Present Illness: Traci Hall is a 71 y.o. female who presents for  a follow-up visit regarding  coronary atherosclerosis noted on CT scan of the chest.  She has known history of diet-controlled diabetes, previous tobacco use with mild COPD with an asthmatic component, essential hypertension, GERD, hyperlipidemia with intolerance to statins and obesity. She quit smoking in 2007. She has no family history of premature coronary artery disease.   Exercise nuclear stress test in September 2016 was low risk. She did have mild ischemic EKG changes with exercise but perfusion was normal and ejection fraction was greater than 65%. Lisinopril was switched to losartan due to cough. The patient was diagnosed with breast cancer and underwent lumpectomy. She is currently getting chemotherapy at [facility] with plans for radiation therapy. She experience worsening shortness of breath recently and was seen by Dr. Alva Hall. It was felt that her COPD was overall mild. She had an echocardiogram done at [facility] that showed normal LV systolic function with no significant valvular abnormalities. However, the study was limited echo and not complete echo. She reports that her shortness of breath actually resolved without intervention and she feels back to her baseline. She has been experiencing elevated blood pressure and she brought readings with her. Her blood pressures frequently about 140 mmHg.    Past Medical History:  Diagnosis Date  . Allergic rhinitis   . Asthma   . Barrett's esophagus   . COPD (chronic obstructive pulmonary disease) ([place])    by prior PFTs  . GERD  (gastroesophageal reflux disease)   . Hyperlipidemia   . Hypertension   . Obesity   . OSA on CPAP   . Personal history of tobacco use, presenting hazards to health 01/08/2015  . Polyp of colon     Past Surgical History:  Procedure Laterality Date  . ABDOMINAL HYSTERECTOMY  1991   secondary to endometriosis, and polyps  . APPENDECTOMY  1977  . BREAST BIOPSY Right 02/24/2016   path pending  . COLONOSCOPY    . ESOPHAGOGASTRODUODENOSCOPY    . ESOPHAGOGASTRODUODENOSCOPY (EGD) WITH PROPOFOL N/A 03/17/2016   Procedure: ESOPHAGOGASTRODUODENOSCOPY (EGD) WITH PROPOFOL;  Surgeon: Traci Sails, MD;  Location: [facility] ENDOSCOPY;  Service: Endoscopy;  Laterality: N/A;  . LIVER BIOPSY    . NASAL SINUS SURGERY  2008  . SKIN CANCER EXCISION  2015, 2017     Current Outpatient Prescriptions  Medication Sig Dispense Refill  . albuterol (PROVENTIL HFA;VENTOLIN HFA) 108 (90 Base) MCG/ACT inhaler Pt uses 2 puff qam  Only.   Give qty sufficient for 90 days 3 Inhaler 3  . aspirin 81 MG tablet Take 81 mg by mouth daily.    Marland Kitchen BREO ELLIPTA 100-25 MCG/INH AEPB USE 1 INHALATION DAILY 180 each 2  . Cholecalciferol (VITAMIN D-3) 1000 UNITS CAPS Take 1 capsule by mouth daily.    Marland Kitchen desloratadine (CLARINEX) 5 MG tablet Take 1 tablet (5 mg total) by mouth daily. 90 tablet 3  . famotidine (PEPCID) 40 MG tablet Take 40 mg by mouth daily.    . furosemide (LASIX)  20 MG tablet take 1 tablet by mouth once daily if needed 30 tablet 3  . hydrocortisone (ANUSOL-HC) 25 MG suppository Place 1 suppository (25 mg total) rectally 2 (two) times daily. 12 suppository 0  . losartan (COZAAR) 50 MG tablet TAKE 1 TABLET DAILY 90 tablet 3  . montelukast (SINGULAIR) 10 MG tablet TAKE 1 TABLET AT BEDTIME 90 tablet 2  . Multiple Vitamins-Minerals (CENTRUM SILVER PO) Take by mouth. Take 1 am     . olopatadine (PATANOL) 0.1 % ophthalmic solution Place 1 drop into both eyes as needed.     . pantoprazole (PROTONIX) 40 MG tablet Take 1  tablet (40 mg total) by mouth daily. 90 tablet 3  . Polyethyl Glycol-Propyl Glycol (SYSTANE OP) Apply to eye. 1-2 drops in each eye as needed.    . vitamin C (ASCORBIC ACID) 500 MG tablet Take 500 mg by mouth daily.    . vitamin E (VITAMIN E) 400 UNIT capsule Take 400 Units by mouth daily.     No current facility-administered medications for this visit.     Allergies:   Alendronate sodium; Demerol; Naproxen; and Statins    Social History:  The patient  reports that she quit smoking about 11 years ago. Her smoking use included Cigarettes. She has a 67.50 pack-year smoking history. She has never used smokeless tobacco. She reports that she drinks alcohol. She reports that she does not use drugs.   Family History:  The patient's family history includes COPD in her mother; Heart disease in her father; Hypertension in her mother.    ROS:  Please see the history of present illness.   Otherwise, review of systems are positive for none.   All other systems are reviewed and negative.    PHYSICAL EXAM: VS:  BP (!) 156/82 (BP Location: Left Arm, Patient Position: Sitting, Cuff Size: Normal)   Pulse 88   Ht 5\' 2"  (1.575 m)   Wt 180 lb 8 oz (81.9 kg)   BMI 33.01 kg/m  , BMI Body mass index is 33.01 kg/m. GEN: Well nourished, well developed, in no acute distress  HEENT: normal  Neck: no JVD, carotid bruits, or masses Cardiac: RRR; no rubs, or gallops,no edema . 1/6 systolic ejection murmur at the base of the heart Respiratory:  clear to auscultation bilaterally, normal work of breathing GI: soft, nontender, nondistended, + BS MS: no deformity or atrophy  Skin: warm and dry, no rash Neuro:  Strength and sensation are intact Psych: euthymic mood, full affect   EKG:  EKG is ordered today. The ekg ordered today demonstrates  normal sinus rhythm with sinus arrhythmia. Nonspecific ST and T wave changes.   Recent Labs: 11/27/2015: ALT 51; BUN 15; Creatinine, Ser 0.85; Hemoglobin 12.9; Platelets  279.0; Potassium 4.5; Sodium 137    Lipid Panel    Component Value Date/Time   CHOL 234 (H) 11/27/2015 1020   CHOL 216 (H) 04/12/2015 0837   TRIG 178.0 (H) 11/27/2015 1020   HDL 45.30 11/27/2015 1020   HDL 51 04/12/2015 0837   CHOLHDL 5 11/27/2015 1020   VLDL 35.6 11/27/2015 1020   LDLCALC 153 (H) 11/27/2015 1020   LDLCALC 131 (H) 04/12/2015 0837   LDLDIRECT 169.0 11/27/2015 1020      Wt Readings from Last 3 Encounters:  07/10/16 180 lb 8 oz (81.9 kg)  06/18/16 183 lb (83 kg)  05/28/16 181 lb 4 oz (82.2 kg)        No flowsheet data found.  ASSESSMENT AND PLAN:  1.  Coronary atherosclerosis without angina: Previous stress test in September 2016 was unremarkable. Continue low-dose aspirin. She is unfortunately intolerant to statins and Zetia.  2. Essential hypertension: Blood pressure has been elevated. Recent labs were unremarkable. I increased losartan to 100 mg once daily.  3. Hyperlipidemia: Not on any medications due to intolerance to statins and Zetia.  4. Exertional dyspnea: She reports that her symptoms resolved without intervention. Her cardiac murmur is benign and recent echocardiogram showed normal LV systolic function. If she has recurrent symptoms, I recommend a complete echocardiogram to get more thorough evaluation of valvular structures and pulmonary pressure. Recent echocardiogram at [facility] was a limited study.    Disposition:   FU with me in 6 months  Signed,  [name], MD  07/10/2016 10:55 AM    [place]

## 2016-07-14 ENCOUNTER — Inpatient Hospital Stay
Admission: EM | Admit: 2016-07-14 | Discharge: 2016-07-15 | DRG: 192 | Disposition: A | Discharge status: Home / Self Care | Payer: Medicare Other | Attending: Internal Medicine | Admitting: Internal Medicine

## 2016-07-14 ENCOUNTER — Emergency Department: Payer: Medicare Other

## 2016-07-14 ENCOUNTER — Encounter: Payer: Self-pay | Admitting: Emergency Medicine

## 2016-07-14 DIAGNOSIS — Z9221 Personal history of antineoplastic chemotherapy: Secondary | ICD-10-CM | POA: Diagnosis not present

## 2016-07-14 DIAGNOSIS — I2510 Atherosclerotic heart disease of native coronary artery without angina pectoris: Secondary | ICD-10-CM | POA: Diagnosis present

## 2016-07-14 DIAGNOSIS — K7581 Nonalcoholic steatohepatitis (NASH): Secondary | ICD-10-CM | POA: Diagnosis present

## 2016-07-14 DIAGNOSIS — E785 Hyperlipidemia, unspecified: Secondary | ICD-10-CM | POA: Diagnosis present

## 2016-07-14 DIAGNOSIS — C50919 Malignant neoplasm of unspecified site of unspecified female breast: Secondary | ICD-10-CM | POA: Diagnosis present

## 2016-07-14 DIAGNOSIS — K219 Gastro-esophageal reflux disease without esophagitis: Secondary | ICD-10-CM | POA: Diagnosis present

## 2016-07-14 DIAGNOSIS — G4733 Obstructive sleep apnea (adult) (pediatric): Secondary | ICD-10-CM | POA: Diagnosis present

## 2016-07-14 DIAGNOSIS — Z7982 Long term (current) use of aspirin: Secondary | ICD-10-CM

## 2016-07-14 DIAGNOSIS — Z87891 Personal history of nicotine dependence: Secondary | ICD-10-CM

## 2016-07-14 DIAGNOSIS — J189 Pneumonia, unspecified organism: Secondary | ICD-10-CM | POA: Diagnosis not present

## 2016-07-14 DIAGNOSIS — I10 Essential (primary) hypertension: Secondary | ICD-10-CM | POA: Diagnosis present

## 2016-07-14 DIAGNOSIS — Z90710 Acquired absence of both cervix and uterus: Secondary | ICD-10-CM | POA: Diagnosis not present

## 2016-07-14 DIAGNOSIS — J440 Chronic obstructive pulmonary disease with acute lower respiratory infection: Principal | ICD-10-CM | POA: Diagnosis present

## 2016-07-14 DIAGNOSIS — J208 Acute bronchitis due to other specified organisms: Secondary | ICD-10-CM | POA: Diagnosis present

## 2016-07-14 DIAGNOSIS — J449 Chronic obstructive pulmonary disease, unspecified: Secondary | ICD-10-CM | POA: Diagnosis not present

## 2016-07-14 DIAGNOSIS — Z825 Family history of asthma and other chronic lower respiratory diseases: Secondary | ICD-10-CM

## 2016-07-14 DIAGNOSIS — Z8249 Family history of ischemic heart disease and other diseases of the circulatory system: Secondary | ICD-10-CM | POA: Diagnosis not present

## 2016-07-14 DIAGNOSIS — Z86010 Personal history of colonic polyps: Secondary | ICD-10-CM | POA: Diagnosis not present

## 2016-07-14 DIAGNOSIS — R509 Fever, unspecified: Secondary | ICD-10-CM | POA: Diagnosis present

## 2016-07-14 HISTORY — DX: Malignant (primary) neoplasm, unspecified: C80.1

## 2016-07-14 LAB — URINALYSIS, COMPLETE (UACMP) WITH MICROSCOPIC
Bacteria, UA: NONE SEEN
Bilirubin Urine: NEGATIVE
GLUCOSE, UA: NEGATIVE mg/dL
Hgb urine dipstick: NEGATIVE
Ketones, ur: NEGATIVE mg/dL
Leukocytes, UA: NEGATIVE
Nitrite: NEGATIVE
PH: 5 (ref 5.0–8.0)
PROTEIN: NEGATIVE mg/dL
RBC / HPF: NONE SEEN RBC/hpf (ref 0–5)
SPECIFIC GRAVITY, URINE: 1.008 (ref 1.005–1.030)

## 2016-07-14 LAB — CBC WITH DIFFERENTIAL/PLATELET
BASOS PCT: 1 %
Basophils Absolute: 0.1 10*3/uL (ref 0–0.1)
EOS PCT: 2 %
Eosinophils Absolute: 0.1 10*3/uL (ref 0–0.7)
HCT: 33 % — ABNORMAL LOW (ref 35.0–47.0)
HEMOGLOBIN: 10.9 g/dL — AB (ref 12.0–16.0)
LYMPHS PCT: 33 %
Lymphs Abs: 2.3 10*3/uL (ref 1.0–3.6)
MCH: 29.7 pg (ref 26.0–34.0)
MCHC: 33.1 g/dL (ref 32.0–36.0)
MCV: 89.6 fL (ref 80.0–100.0)
Monocytes Absolute: 0.8 10*3/uL (ref 0.2–0.9)
Monocytes Relative: 12 %
NEUTROS ABS: 3.6 10*3/uL (ref 1.4–6.5)
NEUTROS PCT: 52 %
Platelets: 376 10*3/uL (ref 150–440)
RBC: 3.68 MIL/uL — ABNORMAL LOW (ref 3.80–5.20)
RDW: 18 % — ABNORMAL HIGH (ref 11.5–14.5)
WBC: 6.9 10*3/uL (ref 3.6–11.0)

## 2016-07-14 LAB — COMPREHENSIVE METABOLIC PANEL
ALK PHOS: 68 U/L (ref 38–126)
ALT: 38 U/L (ref 14–54)
AST: 27 U/L (ref 15–41)
Albumin: 3.7 g/dL (ref 3.5–5.0)
Anion gap: 6 (ref 5–15)
BILIRUBIN TOTAL: 0.4 mg/dL (ref 0.3–1.2)
BUN: 7 mg/dL (ref 6–20)
CALCIUM: 8.9 mg/dL (ref 8.9–10.3)
CO2: 26 mmol/L (ref 22–32)
CREATININE: 0.77 mg/dL (ref 0.44–1.00)
Chloride: 105 mmol/L (ref 101–111)
Glucose, Bld: 182 mg/dL — ABNORMAL HIGH (ref 65–99)
Potassium: 3.6 mmol/L (ref 3.5–5.1)
Sodium: 137 mmol/L (ref 135–145)
TOTAL PROTEIN: 6.6 g/dL (ref 6.5–8.1)

## 2016-07-14 LAB — PROCALCITONIN

## 2016-07-14 LAB — INFLUENZA PANEL BY PCR (TYPE A & B)
INFLAPCR: NEGATIVE
INFLBPCR: NEGATIVE

## 2016-07-14 LAB — LACTIC ACID, PLASMA: Lactic Acid, Venous: 1.6 mmol/L (ref 0.5–1.9)

## 2016-07-14 MED ORDER — MAGNESIUM CITRATE PO SOLN
1.0000 | Freq: Once | ORAL | Status: DC | PRN
  Filled 2016-07-14: qty 296

## 2016-07-14 MED ORDER — MONTELUKAST SODIUM 10 MG PO TABS
10.0000 mg | ORAL_TABLET | Freq: Every day | ORAL | Status: DC
Start: 2016-07-14 — End: 2016-07-15
  Administered 2016-07-14: 21:00:00 10 mg via ORAL
  Filled 2016-07-14: qty 1

## 2016-07-14 MED ORDER — POLYVINYL ALCOHOL 1.4 % OP SOLN
1.0000 [drp] | OPHTHALMIC | Status: DC | PRN
  Filled 2016-07-14: qty 15

## 2016-07-14 MED ORDER — FAMOTIDINE 20 MG PO TABS
40.0000 mg | ORAL_TABLET | Freq: Every day | ORAL | Status: DC
  Administered 2016-07-14: 21:00:00 40 mg via ORAL
  Filled 2016-07-14 (×2): qty 2

## 2016-07-14 MED ORDER — LORAZEPAM 0.5 MG PO TABS
0.5000 mg | ORAL_TABLET | Freq: Every day | ORAL | Status: DC
  Administered 2016-07-14: 0.5 mg via ORAL
  Filled 2016-07-14: qty 1

## 2016-07-14 MED ORDER — LATANOPROST 0.005 % OP SOLN
1.0000 [drp] | Freq: Every day | OPHTHALMIC | Status: DC
  Administered 2016-07-14: 1 [drp] via OPHTHALMIC
  Filled 2016-07-14: qty 2.5

## 2016-07-14 MED ORDER — ENOXAPARIN SODIUM 40 MG/0.4ML ~~LOC~~ SOLN
40.0000 mg | SUBCUTANEOUS | Status: DC
  Administered 2016-07-14: 21:00:00 40 mg via SUBCUTANEOUS
  Filled 2016-07-14: qty 0.4

## 2016-07-14 MED ORDER — PIPERACILLIN-TAZOBACTAM 3.375 G IVPB
3.3750 g | Freq: Three times a day (TID) | INTRAVENOUS | Status: DC
  Administered 2016-07-14 – 2016-07-15 (×2): 3.375 g via INTRAVENOUS
  Filled 2016-07-14 (×2): qty 50

## 2016-07-14 MED ORDER — ZOLPIDEM TARTRATE 5 MG PO TABS: 5.0000 mg | ORAL_TABLET | Freq: Every evening | ORAL | Status: DC | PRN

## 2016-07-14 MED ORDER — ASPIRIN EC 81 MG PO TBEC
81.0000 mg | DELAYED_RELEASE_TABLET | Freq: Every day | ORAL | Status: DC
  Administered 2016-07-14: 81 mg via ORAL
  Filled 2016-07-14 (×2): qty 1

## 2016-07-14 MED ORDER — BISACODYL 5 MG PO TBEC: 5.0000 mg | DELAYED_RELEASE_TABLET | Freq: Every day | ORAL | Status: DC | PRN

## 2016-07-14 MED ORDER — LOSARTAN POTASSIUM 50 MG PO TABS
100.0000 mg | ORAL_TABLET | Freq: Every day | ORAL | Status: DC
  Administered 2016-07-14: 21:00:00 100 mg via ORAL
  Filled 2016-07-14 (×2): qty 2

## 2016-07-14 MED ORDER — PIPERACILLIN-TAZOBACTAM 3.375 G IVPB 30 MIN
3.3750 g | Freq: Once | INTRAVENOUS | Status: AC
  Administered 2016-07-14: 3.375 g via INTRAVENOUS
  Filled 2016-07-14: qty 50

## 2016-07-14 MED ORDER — ACETAMINOPHEN 650 MG RE SUPP: 650.0000 mg | Freq: Four times a day (QID) | RECTAL | Status: DC | PRN

## 2016-07-14 MED ORDER — VANCOMYCIN HCL 10 G IV SOLR
1500.0000 mg | Freq: Once | INTRAVENOUS | Status: AC
  Administered 2016-07-14: 1500 mg via INTRAVENOUS
  Filled 2016-07-14: qty 1500

## 2016-07-14 MED ORDER — RISAQUAD PO CAPS
1.0000 | ORAL_CAPSULE | Freq: Every day | ORAL | Status: DC
  Administered 2016-07-14 – 2016-07-15 (×2): 1 via ORAL
  Filled 2016-07-14 (×2): qty 1

## 2016-07-14 MED ORDER — SODIUM CHLORIDE 0.9 % IV SOLN
INTRAVENOUS | Status: DC
  Administered 2016-07-14: 21:00:00 via INTRAVENOUS

## 2016-07-14 MED ORDER — IPRATROPIUM BROMIDE 0.02 % IN SOLN: 0.5000 mg | Freq: Four times a day (QID) | RESPIRATORY_TRACT | Status: DC | PRN

## 2016-07-14 MED ORDER — VANCOMYCIN HCL IN DEXTROSE 750-5 MG/150ML-% IV SOLN
750.0000 mg | Freq: Two times a day (BID) | INTRAVENOUS | Status: DC
  Administered 2016-07-15: 05:00:00 750 mg via INTRAVENOUS
  Filled 2016-07-14 (×3): qty 150

## 2016-07-14 MED ORDER — ONDANSETRON HCL 4 MG PO TABS: 4.0000 mg | ORAL_TABLET | Freq: Four times a day (QID) | ORAL | Status: DC | PRN

## 2016-07-14 MED ORDER — POLYETHYL GLYCOL-PROPYL GLYCOL 0.4-0.3 % OP GEL: Freq: Every day | OPHTHALMIC | Status: DC | PRN

## 2016-07-14 MED ORDER — PROCHLORPERAZINE MALEATE 10 MG PO TABS: 10.0000 mg | ORAL_TABLET | Freq: Four times a day (QID) | ORAL | Status: DC | PRN

## 2016-07-14 MED ORDER — VITAMIN C 500 MG PO TABS
500.0000 mg | ORAL_TABLET | Freq: Every day | ORAL | Status: DC
Start: 2016-07-15 — End: 2016-07-15
  Administered 2016-07-15: 09:00:00 500 mg via ORAL
  Filled 2016-07-14: qty 1

## 2016-07-14 MED ORDER — ALBUTEROL SULFATE (2.5 MG/3ML) 0.083% IN NEBU: 2.5000 mg | INHALATION_SOLUTION | Freq: Four times a day (QID) | RESPIRATORY_TRACT | Status: DC | PRN

## 2016-07-14 MED ORDER — VITAMIN E 180 MG (400 UNIT) PO CAPS
400.0000 [IU] | ORAL_CAPSULE | Freq: Every day | ORAL | Status: DC
  Administered 2016-07-15: 400 [IU] via ORAL
  Filled 2016-07-14: qty 1

## 2016-07-14 MED ORDER — FLUTICASONE FUROATE-VILANTEROL 100-25 MCG/INH IN AEPB
1.0000 | INHALATION_SPRAY | Freq: Every day | RESPIRATORY_TRACT | Status: DC
  Administered 2016-07-15: 1 via RESPIRATORY_TRACT
  Filled 2016-07-14 (×2): qty 28

## 2016-07-14 MED ORDER — ONDANSETRON HCL 4 MG/2ML IJ SOLN: 4.0000 mg | Freq: Four times a day (QID) | INTRAMUSCULAR | Status: DC | PRN

## 2016-07-14 MED ORDER — LORATADINE 10 MG PO TABS
10.0000 mg | ORAL_TABLET | Freq: Every day | ORAL | Status: DC
  Administered 2016-07-15: 09:00:00 10 mg via ORAL
  Filled 2016-07-14: qty 1

## 2016-07-14 MED ORDER — OXYCODONE HCL 5 MG PO TABS
5.0000 mg | ORAL_TABLET | ORAL | Status: DC | PRN
Start: 2016-07-14 — End: 2016-07-15

## 2016-07-14 MED ORDER — ACETAMINOPHEN 325 MG PO TABS
650.0000 mg | ORAL_TABLET | Freq: Four times a day (QID) | ORAL | Status: DC | PRN
  Administered 2016-07-15: 14:00:00 650 mg via ORAL
  Filled 2016-07-14: qty 2

## 2016-07-14 MED ORDER — SENNOSIDES-DOCUSATE SODIUM 8.6-50 MG PO TABS: 1.0000 | ORAL_TABLET | Freq: Every evening | ORAL | Status: DC | PRN

## 2016-07-14 MED ORDER — OLOPATADINE HCL 0.1 % OP SOLN
1.0000 [drp] | Freq: Two times a day (BID) | OPHTHALMIC | Status: DC
  Administered 2016-07-15: 1 [drp] via OPHTHALMIC
  Filled 2016-07-14: qty 5

## 2016-07-14 MED ORDER — SODIUM CHLORIDE 0.9 % IV BOLUS (SEPSIS)
1000.0000 mL | Freq: Once | INTRAVENOUS | Status: AC
  Administered 2016-07-14: 1000 mL via INTRAVENOUS

## 2016-07-14 MED ORDER — HYDROCORTISONE ACETATE 25 MG RE SUPP
25.0000 mg | Freq: Two times a day (BID) | RECTAL | Status: DC | PRN
  Filled 2016-07-14: qty 1

## 2016-07-14 MED ORDER — VITAMIN D 1000 UNITS PO TABS
1000.0000 [IU] | ORAL_TABLET | Freq: Every day | ORAL | Status: DC
  Administered 2016-07-15: 1000 [IU] via ORAL
  Filled 2016-07-14: qty 1

## 2016-07-14 MED ORDER — PANTOPRAZOLE SODIUM 40 MG PO TBEC
40.0000 mg | DELAYED_RELEASE_TABLET | Freq: Every day | ORAL | Status: DC
  Administered 2016-07-15: 09:00:00 40 mg via ORAL
  Filled 2016-07-14: qty 1

## 2016-07-14 MED ORDER — FUROSEMIDE 20 MG PO TABS: 20.0000 mg | ORAL_TABLET | Freq: Every day | ORAL | Status: DC | PRN

## 2016-07-14 NOTE — ED Provider Notes (Signed)
[facility] [name] Emergency Department Provider Note  ____________________________________________  Time seen: Approximately 2:36 PM  I have reviewed the triage vital signs and the nursing notes.   HISTORY  Chief Complaint Fever    HPI Traci Hall is a 71 y.o. female currently on chemotherapy for breast cancer presenting with fever, rhinorrhea, and cough. The patient reports that for the last 5 days she has had intermittent fevers and chills, as high as 100.8. Today, she began to have a dry cough with clear rhinorrhea. No ear pain, sore throat, shortness of breath. No nausea vomiting or diarrhea, no abdominal pain, no dysuria.Last chemotherapy to Monday's ago; receives her oncology care at [facility].   Past Medical History:  Diagnosis Date  . Allergic rhinitis   . Asthma   . Barrett's esophagus   . Cancer ([place])   . COPD (chronic obstructive pulmonary disease) ([place])    by prior PFTs  . GERD (gastroesophageal reflux disease)   . Hyperlipidemia   . Hypertension   . Obesity   . OSA on CPAP   . Personal history of tobacco use, presenting hazards to health 01/08/2015  . Polyp of colon     Patient Active Problem List   Diagnosis Date Noted  . Malignant neoplasm of upper-outer quadrant of right female breast ([place]) 02/28/2016  . Anxiety state 08/10/2015  . Coronary artery disease involving native coronary artery with other forms of angina pectoris 03/01/2015  . Essential hypertension 03/01/2015  . Personal history of tobacco use, presenting hazards to health 01/08/2015  . Obesity 05/21/2013  . Edema 05/19/2013  . Spinal stenosis at L4-L5 level 04/01/2013  . Statin intolerance 02/23/2013  . Hyperlipidemia 01/11/2013  . Medicare annual wellness visit, subsequent 11/21/2012  . DM II (diabetes mellitus, type II), controlled ([place]) 11/21/2012  . Diverticulosis of colon with hemorrhage 11/04/2012  . Barrett's esophagus 09/06/2012  . NASH (nonalcoholic  steatohepatitis) 10/02/2011  . COPD 09/01/2011  . Rhinitis, allergic 09/01/2011  . OSA (obstructive sleep apnea) 08/25/2011    Past Surgical History:  Procedure Laterality Date  . ABDOMINAL HYSTERECTOMY  1991   secondary to endometriosis, and polyps  . APPENDECTOMY  1977  . BREAST BIOPSY Right 02/24/2016   path pending  . COLONOSCOPY    . ESOPHAGOGASTRODUODENOSCOPY    . ESOPHAGOGASTRODUODENOSCOPY (EGD) WITH PROPOFOL N/A 03/17/2016   Procedure: ESOPHAGOGASTRODUODENOSCOPY (EGD) WITH PROPOFOL;  Surgeon: [name], MD;  Location: [facility] ENDOSCOPY;  Service: Endoscopy;  Laterality: N/A;  . LIVER BIOPSY    . NASAL SINUS SURGERY  2008  . SKIN CANCER EXCISION  2015, 2017    Current Outpatient Rx  . Order #: [id number] Class: Normal  . Order #: [id number] Class: Historical Med  . Order #: [id number] Class: Normal  . Order #: [id number] Class: Historical Med  . Order #: [id number] Class: Normal  . Order #: [id number] Class: Historical Med  . Order #: [id number] Class: Normal  . Order #: [id number] Class: Normal  . Order #: [id number] Class: Normal  . Order #: [id number] Class: Normal  . Order #: [id number] Class: Historical Med  . Order #: [id number] Class: Historical Med  . Order #: [id number] Class: Normal  . Order #: [id number] Class: Historical Med  . Order #: [id number] Class: Historical Med  . Order #: [id number] Class: Historical Med    Allergies Alendronate sodium; Demerol; Naproxen; and Statins  Family History  Problem Relation Age of Onset  . Heart disease Father     valvular cardiomyopathy,  mitral valve  . COPD Mother   . Hypertension Mother   .  Cancer Neg Hx     Social History Social History  Substance Use Topics  . Smoking status: Former Smoker    Packs/day: 1.50    Years: 45.00    Types: Cigarettes    Quit date: 06/15/2005  . Smokeless tobacco: Never Used  . Alcohol use Yes    Review of Systems Constitutional: Positive fever. Positive shaking chills. Eyes: No visual changes. No eye  discharge. ENT: No sore throat. Positive congestion and clear rhinorrhea. Cardiovascular: Denies chest pain. Denies palpitations. No concerns or issues with her left chest port. Respiratory: Denies shortness of breath.  Positive cough. Gastrointestinal: No abdominal pain.  No nausea, no vomiting.  No diarrhea.  No constipation. Genitourinary: Negative for dysuria. Musculoskeletal: Negative for back pain. Skin: Negative for rash. Neurological: Negative for headaches. No focal numbness, tingling or weakness.   10-point ROS otherwise negative.  ____________________________________________   PHYSICAL EXAM:  VITAL SIGNS: ED Triage Vitals  Enc Vitals Group     BP 07/14/16 1204 138/67     Pulse Rate 07/14/16 1204 (!) 105     Resp 07/14/16 1204 (!) 22     Temp 07/14/16 1205 98.5 F (36.9 C)     Temp Source 07/14/16 1205 Oral     SpO2 07/14/16 1204 96 %     Weight 07/14/16 1205 180 lb (81.6 kg)     Height 07/14/16 1205 5\' 2"  (1.575 m)     Head Circumference --      [place] Flow --      Pain Score --      Pain Loc --      Pain Edu? --      Excl. in [place]? --     Constitutional: Alert and oriented. Chronically ill appearing but in no acute distress. Answers questions appropriately. Eyes: Conjunctivae are normal.  EOMI. No scleral icterus. No eye discharge. EARS: L TM obscured by wax, R TM w/o swelling, fullness or erythema. Head: Atraumatic. Nose: No congestion/rhinnorhea. Mouth/Throat: Mucous membranes are moist. Minimal posterior pharyngeal erythema. Tonsils are not apparent although the patient states she has not had them removed. The posterior pharynx is symmetric and uvula is midline. Neck: No stridor.  Supple.  No meningismus. Cardiovascular: Normal rate, regular rhythm. No murmurs, rubs or gallops. Left chest has port in place without any surrounding erythema, swelling or tenderness to palpation. Respiratory: Normal respiratory effort.  No accessory muscle use or retractions. Lungs  CTAB.  No wheezes, rales or ronchi. Gastrointestinal: Overweight. Soft, nontender and nondistended.  No guarding or rebound.  No peritoneal signs. Musculoskeletal: No LE edema.  Neurologic:  A&Ox3.  Speech is clear.  Face and smile are symmetric.  EOMI.  Moves all extremities well. Skin:  Skin is warm, dry and intact. No rash noted. Psychiatric: Mood and affect are normal. Speech and behavior are normal.  Normal judgement.  ____________________________________________   LABS (all labs ordered are listed, but only abnormal results are displayed)  Labs Reviewed  CBC WITH DIFFERENTIAL/PLATELET - Abnormal; Notable for the following:       Result Value   RBC 3.68 (*)    Hemoglobin 10.9 (*)    HCT 33.0 (*)    RDW 18.0 (*)    All other components within normal limits  COMPREHENSIVE METABOLIC PANEL - Abnormal; Notable for the following:    Glucose, Bld 182 (*)    All other components within normal limits  URINALYSIS, COMPLETE (UACMP) WITH MICROSCOPIC - Abnormal; Notable for the following:  Color, Urine YELLOW (*)    APPearance CLEAR (*)    Squamous Epithelial / LPF 0-5 (*)    All other components within normal limits  URINE CULTURE  CULTURE, BLOOD (ROUTINE X 2)  CULTURE, BLOOD (ROUTINE X 2)  INFLUENZA PANEL BY PCR (TYPE A & B)  LACTIC ACID, PLASMA  LACTIC ACID, PLASMA   ____________________________________________  EKG  ED ECG REPORT I, Eula Listen, the attending physician, personally viewed and interpreted this ECG.   Date: 07/14/2016  EKG Time: 1638  Rate: 99  Rhythm: normal sinus rhythm  Axis: normal  Intervals:none  ST&T Change: no STEMI  ____________________________________________  RADIOLOGY  Dg Chest 2 View  Result Date: 07/14/2016 CLINICAL DATA:  Onset of fever 4 days ago. The patient is undergoing chemotherapy for right-sided breast malignancy. History of asthma -COPD, hypertension, former smoker. EXAM: CHEST  2 VIEW COMPARISON:  Chest x-ray of  July 26, 2011 and CT scan of the chest of January 22, 2016. FINDINGS: The lungs are mildly hyperinflated. The interstitial markings are coarse and more conspicuous than in the past. The heart is top-normal in size. The pulmonary vascularity is normal. The porta catheter tip projects over the midportion of the SVC. There is no pleural effusion. The mediastinum is normal in width. The bony thorax exhibits no acute abnormality. IMPRESSION: COPD. Mild interstitial prominence suggests superimposed acute bronchitis or early interstitial pneumonia. There is no alveolar pneumonia nor definite CHF. Followup PA and lateral chest X-ray is recommended in 3-4 weeks following trial of antibiotic therapy to ensure resolution and exclude underlying malignancy. Electronically Signed   By: David  Martinique M.D.   On: 07/14/2016 15:50    ____________________________________________   PROCEDURES  Procedure(s) performed: None  Procedures  Critical Care performed: No ____________________________________________   INITIAL IMPRESSION / ASSESSMENT AND PLAN / ED COURSE  Pertinent labs & imaging results that were available during my care of the patient were reviewed by me and considered in my medical decision making (see chart for details).  71 y.o. female with cough, rhinorrhea, and fever on chemotherapy for breast cancer. I am concerned about febrile neutropenia and I'm awaiting her counts to come back. I will initiate empiric anabiotic's with vancomycin and Zosyn although it is possible that the patient has a respiratory illness, either bacterial or viral. The influenza testing done from triage is negative today. We'll get a chest x-ray to evaluate for pneumonia. We have blood cultures, urine, urine culture, and hydrate the patient with intravenous fluids. She will be admitted to the hospital for further evaluation and treatment.  ----------------------------------------- 4:21 PM on  07/14/2016 -----------------------------------------  The patient's tachycardia has resolved, and we're continuing to monitor her temperature although she has not had any further fevers here. Her influenza testing is negative, but her chest x-ray does show an area which may be concerning for pneumonia. I have already given her empiric antibiotics and will plan to admit her to the hospital at this time.  ____________________________________________  FINAL CLINICAL IMPRESSION(S) / ED DIAGNOSES  Final diagnoses:  HCAP (healthcare-associated pneumonia)         NEW MEDICATIONS STARTED DURING THIS VISIT:  New Prescriptions   No medications on file      Eula Listen, MD 07/14/16 1646

## 2016-07-14 NOTE — Consult Note (Signed)
[facility] Infectious Disease     Reason for Consult: PNA, IMMUNOCOMPROMISED     Referring Physician: Hugelmeyer, A Date of Admission:  07/14/2016   Active Problems:   HCAP (healthcare-associated pneumonia)   HPI: Traci Hall is a 71 y.o. female admitted with cough, rhinorrhea and fevers. She is on chemo for breast cancer at [facility] (Paclitaxel weekly/Trastuzumab q3 weeks). On admit temp 99, wbc 7. CXR showed increased interstitial markings and COPD. Flu PCR neg, UA neg. Blood urine and sputum cx penidng.  She follows with Dr Sung Amabile in pulm for COPD and DOE and last saw him 1/4. Has been in pred tapers recently as well as chemo. Since admit  Temp 100.1.  Still with dry cough.    Past Medical History:  Diagnosis Date  . Allergic rhinitis   . Asthma   . Barrett's esophagus   . Cancer (HCC)   . COPD (chronic obstructive pulmonary disease) (HCC)    by prior PFTs  . GERD (gastroesophageal reflux disease)   . Hyperlipidemia   . Hypertension   . Obesity   . OSA on CPAP   . Personal history of tobacco use, presenting hazards to health 01/08/2015  . Polyp of colon    Past Surgical History:  Procedure Laterality Date  . ABDOMINAL HYSTERECTOMY  1991   secondary to endometriosis, and polyps  . APPENDECTOMY  1977  . BREAST BIOPSY Right 02/24/2016   path pending  . COLONOSCOPY    . ESOPHAGOGASTRODUODENOSCOPY    . ESOPHAGOGASTRODUODENOSCOPY (EGD) WITH PROPOFOL N/A 03/17/2016   Procedure: ESOPHAGOGASTRODUODENOSCOPY (EGD) WITH PROPOFOL;  Surgeon: Christena Deem, MD;  Location: [facility] ENDOSCOPY;  Service: Endoscopy;  Laterality: N/A;  . LIVER BIOPSY    . NASAL SINUS SURGERY  2008  . SKIN CANCER EXCISION  2015, 2017   Social History  Substance Use Topics  . Smoking status: Former Smoker    Packs/day: 1.50    Years: 45.00    Types: Cigarettes    Quit date: 06/15/2005  . Smokeless tobacco: Never Used  . Alcohol use Yes   Family History  Problem Relation Age of Onset  . Heart  disease Father     valvular cardiomyopathy,  mitral valve  . COPD Mother   . Hypertension Mother   . Cancer Neg Hx     Allergies:  Allergies  Allergen Reactions  . Alendronate Sodium     Leg cramps *FOSAMAX*  . Demerol     Over sensitivity  . Naproxen     Leg cramps an swelling    . Statins     Cramps in legs     Current antibiotics: Antibiotics Given (last 72 hours)    None      MEDICATIONS: . acidophilus  1 capsule Oral Daily  . aspirin EC  81 mg Oral Daily  . [START ON 07/15/2016] cholecalciferol  1,000 Units Oral Daily  . enoxaparin (LOVENOX) injection  40 mg Subcutaneous Q24H  . famotidine  40 mg Oral Daily  . [START ON 07/15/2016] fluticasone furoate-vilanterol  1 puff Inhalation Daily  . latanoprost  1 drop Both Eyes QHS  . [START ON 07/15/2016] loratadine  10 mg Oral Daily  . LORazepam  0.5 mg Oral QHS  . losartan  100 mg Oral Daily  . montelukast  10 mg Oral QHS  . olopatadine  1 drop Both Eyes BID  . [START ON 07/15/2016] pantoprazole  40 mg Oral Daily  . piperacillin-tazobactam (ZOSYN)  IV  3.375 g  Intravenous Q8H  . [START ON 07/15/2016] vancomycin  750 mg Intravenous Q12H  . [START ON 07/15/2016] vitamin C  500 mg Oral Daily  . [START ON 07/15/2016] vitamin E  400 Units Oral Daily    Review of Systems - 11 systems reviewed and negative per HPI   OBJECTIVE: Temp:  [98.5 F (36.9 C)-99.3 F (37.4 C)] 99.1 F (37.3 C) (01/30 1855) Pulse Rate:  [105-109] 109 (01/30 1855) Resp:  [16-22] 16 (01/30 1855) BP: (128-138)/(67-96) 128/96 (01/30 1855) SpO2:  [96 %-99 %] 99 % (01/30 1855) Weight:  [81.3 kg (179 lb 3.2 oz)-81.6 kg (180 lb)] 81.3 kg (179 lb 3.2 oz) (01/30 1855) Physical Exam  Constitutional:  oriented to person, place, and time. appears well-developed and well-nourished. No distress. Well appearing but coughing HENT: [place]/AT, PERRLA, no scleral icterus Mouth/Throat: Oropharynx is clear and moist. No oropharyngeal exudate.  Cardiovascular: Normal  rate, regular rhythm and normal heart sounds. Exam reveals no gallop and no friction rub.  Pulmonary/Chest: Effort normal and breath sounds normal. No respiratory distress.  has no wheezes.  Neck =supple, no nuchal rigidity Abdominal: Soft. Bowel sounds are normal.  exhibits no distension. There is no tenderness.  Lymphadenopathy: no cervical adenopathy. No axillary adenopathy Neurological: alert and oriented to person, place, and time.  Skin: Skin is warm and dry. No rash noted. No erythema.  Psychiatric: a normal mood and affect.  behavior is normal.    LABS: Results for orders placed or performed during the hospital encounter of 07/14/16 (from the past 48 hour(s))  CBC with Differential     Status: Abnormal   Collection Time: 07/14/16 12:46 PM  Result Value Ref Range   WBC 6.9 3.6 - 11.0 K/uL   RBC 3.68 (L) 3.80 - 5.20 MIL/uL   Hemoglobin 10.9 (L) 12.0 - 16.0 g/dL   HCT 33.0 (L) 35.0 - 47.0 %   MCV 89.6 80.0 - 100.0 fL   MCH 29.7 26.0 - 34.0 pg   MCHC 33.1 32.0 - 36.0 g/dL   RDW 18.0 (H) 11.5 - 14.5 %   Platelets 376 150 - 440 K/uL   Neutrophils Relative % 52 %   Lymphocytes Relative 33 %   Monocytes Relative 12 %   Eosinophils Relative 2 %   Basophils Relative 1 %   Neutro Abs 3.6 1.4 - 6.5 K/uL   Lymphs Abs 2.3 1.0 - 3.6 K/uL   Monocytes Absolute 0.8 0.2 - 0.9 K/uL   Eosinophils Absolute 0.1 0 - 0.7 K/uL   Basophils Absolute 0.1 0 - 0.1 K/uL   Smear Review MORPHOLOGY UNREMARKABLE   Comprehensive metabolic panel     Status: Abnormal   Collection Time: 07/14/16 12:46 PM  Result Value Ref Range   Sodium 137 135 - 145 mmol/L   Potassium 3.6 3.5 - 5.1 mmol/L   Chloride 105 101 - 111 mmol/L   CO2 26 22 - 32 mmol/L   Glucose, Bld 182 (H) 65 - 99 mg/dL   BUN 7 6 - 20 mg/dL   Creatinine, Ser 0.77 0.44 - 1.00 mg/dL   Calcium 8.9 8.9 - 10.3 mg/dL   Total Protein 6.6 6.5 - 8.1 g/dL   Albumin 3.7 3.5 - 5.0 g/dL   AST 27 15 - 41 U/L   ALT 38 14 - 54 U/L   Alkaline Phosphatase  68 38 - 126 U/L   Total Bilirubin 0.4 0.3 - 1.2 mg/dL   GFR calc non Af Amer >60 >60 mL/min  GFR calc Af Amer >60 >60 mL/min    Comment: (NOTE) The eGFR has been calculated using the CKD EPI equation. This calculation has not been validated in all clinical situations. eGFR's persistently <60 mL/min signify possible Chronic Kidney Disease.    Anion gap 6 5 - 15  Influenza panel by PCR (type A & B)     Status: None   Collection Time: 07/14/16 12:46 PM  Result Value Ref Range   Influenza A By PCR NEGATIVE NEGATIVE   Influenza B By PCR NEGATIVE NEGATIVE    Comment: (NOTE) The Xpert Xpress Flu assay is intended as an aid in the diagnosis of  influenza and should not be used as a sole basis for treatment.  This  assay is FDA approved for nasopharyngeal swab specimens only. Nasal  washings and aspirates are unacceptable for Xpert Xpress Flu testing.   Urinalysis, Complete w Microscopic     Status: Abnormal   Collection Time: 07/14/16  2:37 PM  Result Value Ref Range   Color, Urine YELLOW (A) YELLOW   APPearance CLEAR (A) CLEAR   Specific Gravity, Urine 1.008 1.005 - 1.030   pH 5.0 5.0 - 8.0   Glucose, UA NEGATIVE NEGATIVE mg/dL   Hgb urine dipstick NEGATIVE NEGATIVE   Bilirubin Urine NEGATIVE NEGATIVE   Ketones, ur NEGATIVE NEGATIVE mg/dL   Protein, ur NEGATIVE NEGATIVE mg/dL   Nitrite NEGATIVE NEGATIVE   Leukocytes, UA NEGATIVE NEGATIVE   RBC / HPF NONE SEEN 0 - 5 RBC/hpf   WBC, UA 0-5 0 - 5 WBC/hpf   Bacteria, UA NONE SEEN NONE SEEN   Squamous Epithelial / LPF 0-5 (A) NONE SEEN  Lactic acid, plasma     Status: None   Collection Time: 07/14/16  2:37 PM  Result Value Ref Range   Lactic Acid, Venous 1.6 0.5 - 1.9 mmol/L   No components found for: ESR, C REACTIVE PROTEIN MICRO: No results found for this or any previous visit (from the past 720 hour(s)).  IMAGING: Dg Chest 2 View  Result Date: 07/14/2016 CLINICAL DATA:  Onset of fever 4 days ago. The patient is undergoing  chemotherapy for right-sided breast malignancy. History of asthma -COPD, hypertension, former smoker. EXAM: CHEST  2 VIEW COMPARISON:  Chest x-ray of July 26, 2011 and CT scan of the chest of January 22, 2016. FINDINGS: The lungs are mildly hyperinflated. The interstitial markings are coarse and more conspicuous than in the past. The heart is top-normal in size. The pulmonary vascularity is normal. The porta catheter tip projects over the midportion of the SVC. There is no pleural effusion. The mediastinum is normal in width. The bony thorax exhibits no acute abnormality. IMPRESSION: COPD. Mild interstitial prominence suggests superimposed acute bronchitis or early interstitial pneumonia. There is no alveolar pneumonia nor definite CHF. Followup PA and lateral chest X-ray is recommended in 3-4 weeks following trial of antibiotic therapy to ensure resolution and exclude underlying malignancy. Electronically Signed   By: Miley Blanchett  [place] M.D.   On: 07/14/2016 15:50    Assessment:   Loana Salvaggio is a 71 y.o. female admitted with cough and low grade fevers. CXR does not show any focal infiltrate, wbc nml, procalcitonin nml. I suspect all viral URI and would suggest symptomatic treatment but no need for antibiotics.  Recommendations Dc off abx Cheratussin for cough Tylenol and symptomatic management  Thank you very much for allowing me to participate in the care of this patient. Please call with questions.  Cheral Marker. Ola Spurr, MD

## 2016-07-14 NOTE — ED Triage Notes (Signed)
Pt to triage for fever that started Friday. Temp high 100.8. Pt been taking tylenol. Pt currently receiving chemo treatments for right breast ca. Treatments are on Monday.

## 2016-07-14 NOTE — Progress Notes (Signed)
Pharmacy Antibiotic Note  Traci Hall is a 71 y.o. female admitted on 07/14/2016 with pneumonia.  Pharmacy has been consulted for vancomycin and piperacillin/tazobactam dosing.  Patient has breast cancer and is currently receiving chemotherapy, but missed yesterday's chemotherapy due to fever/illness.    Plan: Piperacillin/tazobactam 3.375 g IV q8h EI  Vancomycin 1500 mg loading dose given in ED (~23 mg/kg). Will follow with vancomycin 750 mg IV q12h beginning 12 hours after initial dose. Goal vancomycin trough 15-20 mcg/mL Vancomycin trough ordered for 2/1 @ 1630 which is prior to 5th dose and should represent steady state Patient is at risk for accumulation due to obesity.  Kinetics: Using adjusted body weight = 63 kg Ke: 0.060 Half-life: 11.5 hours Vd: 44 L Cmin (calculateD) ~ 16 mcg/mL   Height: 5\' 2"  (157.5 cm) Weight: 179 lb 3.2 oz (81.3 kg) IBW/kg (Calculated) : 50.1  Temp (24hrs), Avg:99 F (37.2 C), Min:98.5 F (36.9 C), Max:99.3 F (37.4 C)   Recent Labs Lab 07/14/16 1246 07/14/16 1437  WBC 6.9  --   CREATININE 0.77  --   LATICACIDVEN  --  1.6    Estimated Creatinine Clearance: 64.7 mL/min (by C-G formula based on SCr of 0.77 mg/dL).    Allergies  Allergen Reactions  . Alendronate Sodium     Leg cramps *FOSAMAX*  . Demerol     Over sensitivity  . Naproxen     Leg cramps an swelling    . Statins     Cramps in legs     Antimicrobials this admission: vancomycin 1/30 >>  Piperacillin/tazobactam 1/30 >>   Dose adjustments this admission:  Microbiology results: 1/30 BCx: Sent 1/30 UCx: Sent  1/30 Sputum: Sent   Thank you for allowing pharmacy to be a part of this patient's care.  Lenis Noon, PharmD, BCPS Clinical Pharmacist 07/14/2016 7:50 PM

## 2016-07-14 NOTE — H&P (Signed)
History and Physical   [facility] - [place] @ [facility] Admission History and Physical [employer], D.O.    Patient Name: Traci Hall [facility] MR#: [id number] Date of Birth: 1946/06/07 Date of Admission: 07/14/2016  Referring MD/NP/PA: Dr. Mariea Clonts Primary Care Physician: [name], MD Outpatient Specialists: [facility] Heme Onc, Dr. [name],   Patient coming from: Home  Chief Complaint: Fever  HPI: Traci Hall is a 71 y.o. female with a known history of breast cancer on chemotherapy, coronary artery disease COPD, asthma, hypertension, hyperlipidemia, GERD with Barrett's, NASH, obesity, obstructive sleep apnea was in a usual state of health until about 5 days ago when she developed fevers at home as high as 100.8 associated with nasal and sinus congestion, nonproductive cough, generalized fatigue and weakness. Patient has chemotherapy regularly on Mondays however she missed yesterday's chemotherapy secondary to fever and illness..   Otherwise there has been no change in status. Patient has been taking medication as prescribed and there has been no recent change in medication or diet.  No recent antibiotics.  There has been no recent illness, hospitalizations, travel or sick contacts.    Patient denies  dizziness, chest pain, shortness of breath, N/V/C/D, abdominal pain, dysuria/frequency, changes in mental status.   ED Course: Patient received Zosyn, vancomycin, 1 L normal saline bolus  Review of Systems:  CONSTITUTIONAL: Positive  fever/chills, fatigue, weakness, negative weight gain/loss, headache. EYES: No blurry or double vision. ENT: No tinnitus, postnasal drip, redness or soreness of the oropharynx. RESPIRATORY: Positive cough, negative dyspnea, wheeze.  No hemoptysis.  CARDIOVASCULAR: No chest pain, palpitations, syncope, orthopnea. No lower extremity edema.  GASTROINTESTINAL: No nausea, vomiting, abdominal pain, diarrhea, constipation.  No hematemesis, melena or  hematochezia. GENITOURINARY: No dysuria, frequency, hematuria. ENDOCRINE: No polyuria or nocturia. No heat or cold intolerance. HEMATOLOGY: No anemia, bruising, bleeding. INTEGUMENTARY: No rashes, ulcers, lesions. MUSCULOSKELETAL: No arthritis, gout, dyspnea. NEUROLOGIC: No numbness, tingling, ataxia, seizure-type activity, weakness. PSYCHIATRIC: No anxiety, depression, insomnia.   Past Medical History:  Diagnosis Date  . Allergic rhinitis   . Asthma   . Barrett's esophagus   . Cancer ([place])   . COPD (chronic obstructive pulmonary disease) ([place])    by prior PFTs  . GERD (gastroesophageal reflux disease)   . Hyperlipidemia   . Hypertension   . Obesity   . OSA on CPAP   . Personal history of tobacco use, presenting hazards to health 01/08/2015  . Polyp of colon     Past Surgical History:  Procedure Laterality Date  . ABDOMINAL HYSTERECTOMY  1991   secondary to endometriosis, and polyps  . APPENDECTOMY  1977  . BREAST BIOPSY Right 02/24/2016   path pending  . COLONOSCOPY    . ESOPHAGOGASTRODUODENOSCOPY    . ESOPHAGOGASTRODUODENOSCOPY (EGD) WITH PROPOFOL N/A 03/17/2016   Procedure: ESOPHAGOGASTRODUODENOSCOPY (EGD) WITH PROPOFOL;  Surgeon: [name], MD;  Location: [facility] ENDOSCOPY;  Service: Endoscopy;  Laterality: N/A;  . LIVER BIOPSY    . NASAL SINUS SURGERY  2008  . SKIN CANCER EXCISION  2015, 2017     reports that she quit smoking about 11 years ago. Her smoking use included Cigarettes. She has a 67.50 pack-year smoking history. She has never used smokeless tobacco. She reports that she drinks alcohol. She reports that she does not use drugs.  Allergies  Allergen Reactions  . Alendronate Sodium     Leg cramps *FOSAMAX*  . Demerol     Over sensitivity  . Naproxen  Leg cramps an swelling    . Statins     Cramps in legs     Family History  Problem Relation Age of Onset  . Heart disease Father     valvular cardiomyopathy,  mitral valve  . COPD Mother    . Hypertension Mother   . Cancer Neg Hx    Family history has been reviewed and confirmed with patient.   Prior to Admission medications   Medication Sig Start Date End Date Taking? Authorizing Provider  albuterol (PROVENTIL HFA;VENTOLIN HFA) 108 (90 Base) MCG/ACT inhaler Pt uses 2 puff qam  Only.   Give qty sufficient for 90 days 11/27/15  Yes [name], MD  aspirin 81 MG tablet Take 81 mg by mouth daily.   Yes Historical Provider, MD  bimatoprost (LUMIGAN) 0.03 % ophthalmic solution Place 1 drop into both eyes at bedtime.   Yes Historical Provider, MD  BREO ELLIPTA 100-25 MCG/INH AEPB USE 1 INHALATION DAILY 04/13/16  Yes [name], MD  Cholecalciferol (VITAMIN D-3) 1000 UNITS CAPS Take 1 capsule by mouth daily.   Yes Historical Provider, MD  desloratadine (CLARINEX) 5 MG tablet Take 1 tablet (5 mg total) by mouth daily. 07/02/15  Yes [name], MD  famotidine (PEPCID) 40 MG tablet Take 40 mg by mouth daily.   Yes Historical Provider, MD  furosemide (LASIX) 20 MG tablet take 1 tablet by mouth once daily if needed 12/04/14  Yes [name], MD  hydrocortisone (ANUSOL-HC) 25 MG suppository Place 1 suppository (25 mg total) rectally 2 (two) times daily. 02/02/14  Yes Jackolyn Confer, MD  LORazepam (ATIVAN) 0.5 MG tablet Take 0.5 mg by mouth at bedtime. Following chemo   Yes Historical Provider, MD  losartan (COZAAR) 100 MG tablet Take 1 tablet (100 mg total) by mouth daily. 07/10/16 10/08/16 Yes Wellington Hampshire, MD  montelukast (SINGULAIR) 10 MG tablet TAKE 1 TABLET AT BEDTIME 03/02/16  Yes [name], MD  Multiple Vitamins-Minerals (CENTRUM SILVER PO) Take by mouth. Take 1 am    Yes Historical Provider, MD  olopatadine (PATANOL) 0.1 % ophthalmic solution Place 1 drop into both eyes as needed.    Yes Historical Provider, MD  pantoprazole (PROTONIX) 40 MG tablet Take 1 tablet (40 mg total) by mouth daily. 12/16/15  Yes [name], MD  Polyethyl Glycol-Propyl Glycol (SYSTANE  OP) Apply to eye. 1-2 drops in each eye as needed.   Yes Historical Provider, MD  prochlorperazine (COMPAZINE) 10 MG tablet Take 10 mg by mouth every 6 (six) hours as needed for nausea or vomiting.   Yes Historical Provider, MD  vitamin C (ASCORBIC ACID) 500 MG tablet Take 500 mg by mouth daily.   Yes Historical Provider, MD  vitamin E (VITAMIN E) 400 UNIT capsule Take 400 Units by mouth daily.   Yes Historical Provider, MD    Physical Exam: Vitals:   07/14/16 1204 07/14/16 1205 07/14/16 1434  BP: 138/67    Pulse: (!) 105    Resp: (!) 22    Temp:  98.5 F (36.9 C) 99.3 F (37.4 C)  TempSrc:  Oral   SpO2: 96%    Weight:  81.6 kg (180 lb)   Height:  5\' 2"  (1.575 m)     GENERAL: 71 y.o.-year-old Female patient, well-developed, well-nourished lying in the bed in no acute distress.  Pleasant and cooperative.   HEENT: Head atraumatic, normocephalic. Pupils equal, round, reactive to light and accommodation. No scleral icterus.  Extraocular muscles intact. Nares are patent. Oropharynx is clear. Mucus membranes moist. NECK: Supple, full range of motion. No JVD, no bruit heard. No thyroid enlargement, no tenderness, no cervical lymphadenopathy. CHEST: Normal breath sounds bilaterally. No wheezing, rales, rhonchi or crackles. No use of accessory muscles of respiration.  No reproducible chest wall tenderness. Probable left chest wall port him a nontender without erythema CARDIOVASCULAR: S1, S2 normal. No murmurs, rubs, or gallops. Cap refill <2 seconds. Pulses intact distally.  ABDOMEN: Soft, nondistended, nontender. No rebound, guarding, rigidity. Normoactive bowel sounds present in all four quadrants. No organomegaly or mass. EXTREMITIES: No pedal edema, cyanosis, or clubbing. No calf tenderness or Homan's sign.  NEUROLOGIC: The patient is alert and oriented x 3. Cranial nerves II through XII are grossly intact with no focal sensorimotor deficit. Muscle strength 5/5 in all extremities. Sensation  intact. Gait not checked. PSYCHIATRIC:  Normal affect, mood, thought content. SKIN: Warm, dry, and intact without obvious rash, lesion, or ulcer.    Labs on Admission:  CBC:  Recent Labs Lab 07/14/16 1246  WBC 6.9  NEUTROABS 3.6  HGB 10.9*  HCT 33.0*  MCV 89.6  PLT [id number]   Basic Metabolic Panel:  Recent Labs Lab 07/14/16 1246  NA 137  K 3.6  CL 105  CO2 26  GLUCOSE 182*  BUN 7  CREATININE 0.77  CALCIUM 8.9   GFR: Estimated Creatinine Clearance: 64.8 mL/min (by C-G formula based on SCr of 0.77 mg/dL). Liver Function Tests:  Recent Labs Lab 07/14/16 1246  AST 27  ALT 38  ALKPHOS 68  BILITOT 0.4  PROT 6.6  ALBUMIN 3.7   No results for input(s): LIPASE, AMYLASE in the last 168 hours. No results for input(s): AMMONIA in the last 168 hours. Coagulation Profile: No results for input(s): INR, PROTIME in the last 168 hours. Cardiac Enzymes: No results for input(s): CKTOTAL, CKMB, CKMBINDEX, TROPONINI in the last 168 hours. BNP (last 3 results) No results for input(s): PROBNP in the last 8760 hours. HbA1C: No results for input(s): HGBA1C in the last 72 hours. CBG: No results for input(s): GLUCAP in the last 168 hours. Lipid Profile: No results for input(s): CHOL, HDL, LDLCALC, TRIG, CHOLHDL, LDLDIRECT in the last 72 hours. Thyroid Function Tests: No results for input(s): TSH, T4TOTAL, FREET4, T3FREE, THYROIDAB in the last 72 hours. Anemia Panel: No results for input(s): VITAMINB12, FOLATE, FERRITIN, TIBC, IRON, RETICCTPCT in the last 72 hours. Urine analysis:    Component Value Date/Time   COLORURINE YELLOW (A) 07/14/2016 1437   APPEARANCEUR CLEAR (A) 07/14/2016 1437   LABSPEC 1.008 07/14/2016 1437   PHURINE 5.0 07/14/2016 1437   GLUCOSEU NEGATIVE 07/14/2016 1437   GLUCOSEU NEGATIVE 05/30/2014 0959   HGBUR NEGATIVE 07/14/2016 1437   BILIRUBINUR NEGATIVE 07/14/2016 1437   BILIRUBINUR neg 05/30/2014 1002   KETONESUR NEGATIVE 07/14/2016 1437   PROTEINUR  NEGATIVE 07/14/2016 1437   UROBILINOGEN 0.2 05/30/2014 1002   UROBILINOGEN 0.2 05/30/2014 0959   NITRITE NEGATIVE 07/14/2016 1437   LEUKOCYTESUR NEGATIVE 07/14/2016 1437   Sepsis Labs: @LABRCNTIP (procalcitonin:4,lacticidven:4) )No results found for this or any previous visit (from the past 240 hour(s)).   Radiological Exams on Admission: Dg Chest 2 View  Result Date: 07/14/2016 CLINICAL DATA:  Onset of fever 4 days ago. The patient is undergoing chemotherapy for right-sided breast malignancy. History of asthma -COPD, hypertension, former smoker. EXAM: CHEST  2 VIEW COMPARISON:  Chest x-ray of July 26, 2011 and CT scan of the chest of January 22, 2016. FINDINGS:  The lungs are mildly hyperinflated. The interstitial markings are coarse and more conspicuous than in the past. The heart is top-normal in size. The pulmonary vascularity is normal. The porta catheter tip projects over the midportion of the SVC. There is no pleural effusion. The mediastinum is normal in width. The bony thorax exhibits no acute abnormality. IMPRESSION: COPD. Mild interstitial prominence suggests superimposed acute bronchitis or early interstitial pneumonia. There is no alveolar pneumonia nor definite CHF. Followup PA and lateral chest X-ray is recommended in 3-4 weeks following trial of antibiotic therapy to ensure resolution and exclude underlying malignancy. Electronically Signed   By: David  Martin[place] M.D.   On: 07/14/2016 15:50    EKG: Normal sinus rhythm at 99 bpm with normal axis and nonspecific ST-T wave changes.   Assessment/Plan  This is a 71 y.o. female with a history of breast cancer on chemotherapy, coronary artery disease COPD, asthma, hypertension, hyperlipidemia, GERD with Barrett's, NASH, obesity, obstructive sleep apnea now being admitted with:  1. Febrile illness in an immunosuppressed patient, likely early HCAP given clinical history and XR. Marland Kitchen Other sources possible are urine and port -Admit to  inpatient -Cover with IV vancomycin and Zosyn -Follow blood urine and sputum cultures as available -Gentle IV fluid hydration -O2 therapy and med nebs as needed  -Given patient's immunocompromise will consult infectious disease  2. History of COPD/asthma -Continue Breo Ellipta, Singulair, Clarinex  3. History of hypertension -Continue Lasix, losartan   4. History of CAD -Continue aspirin   5. History of GERD, Barrett's -Continue Protonix and Pepcid   Admission status: Inpatient  IV Fluids: IV normal saline  Diet/Nutrition: Heart healthy  Consults called: Infectious disease  DVT Px: Lovenox, SCDs and early ambulation. Code Status: Full Code  Disposition Plan: To home in 1-2 days   All the records are reviewed and case discussed with ED provider. Management plans discussed with the patient and/or family who express understanding and agree with plan of care.  Colston Pyle D.O. on 07/14/2016 at 4:44 PM Between 7am to 6pm - Pager - [phone] After 6pm go to [email or web address] - [employer] [facility] [place] [facility] Office [phone] CC: Primary care physician; [name], MD   07/14/2016, 4:44 PM

## 2016-07-15 ENCOUNTER — Encounter: Payer: Self-pay | Admitting: Pulmonary Disease

## 2016-07-15 DIAGNOSIS — J189 Pneumonia, unspecified organism: Secondary | ICD-10-CM | POA: Diagnosis not present

## 2016-07-15 DIAGNOSIS — R509 Fever, unspecified: Secondary | ICD-10-CM | POA: Diagnosis not present

## 2016-07-15 DIAGNOSIS — J449 Chronic obstructive pulmonary disease, unspecified: Secondary | ICD-10-CM | POA: Diagnosis not present

## 2016-07-15 DIAGNOSIS — J208 Acute bronchitis due to other specified organisms: Secondary | ICD-10-CM | POA: Diagnosis not present

## 2016-07-15 DIAGNOSIS — K7581 Nonalcoholic steatohepatitis (NASH): Secondary | ICD-10-CM | POA: Diagnosis not present

## 2016-07-15 DIAGNOSIS — J440 Chronic obstructive pulmonary disease with acute lower respiratory infection: Secondary | ICD-10-CM | POA: Diagnosis not present

## 2016-07-15 DIAGNOSIS — I10 Essential (primary) hypertension: Secondary | ICD-10-CM | POA: Diagnosis not present

## 2016-07-15 DIAGNOSIS — C50919 Malignant neoplasm of unspecified site of unspecified female breast: Secondary | ICD-10-CM | POA: Diagnosis not present

## 2016-07-15 DIAGNOSIS — K219 Gastro-esophageal reflux disease without esophagitis: Secondary | ICD-10-CM | POA: Diagnosis not present

## 2016-07-15 LAB — CBC WITH DIFFERENTIAL/PLATELET
BAND NEUTROPHILS: 6 %
BASOS ABS: 0.2 10*3/uL — AB (ref 0–0.1)
Basophils Relative: 3 %
Blasts: 0 %
EOS PCT: 3 %
Eosinophils Absolute: 0.2 10*3/uL (ref 0–0.7)
HEMATOCRIT: 29.4 % — AB (ref 35.0–47.0)
HEMOGLOBIN: 10.1 g/dL — AB (ref 12.0–16.0)
LYMPHS ABS: 2.7 10*3/uL (ref 1.0–3.6)
Lymphocytes Relative: 41 %
MCH: 30.5 pg (ref 26.0–34.0)
MCHC: 34.2 g/dL (ref 32.0–36.0)
MCV: 89.1 fL (ref 80.0–100.0)
METAMYELOCYTES PCT: 1 %
MONOS PCT: 10 %
Monocytes Absolute: 0.7 10*3/uL (ref 0.2–0.9)
Myelocytes: 1 %
NEUTROS ABS: 2.7 10*3/uL (ref 1.4–6.5)
Neutrophils Relative %: 35 %
Other: 0 %
Platelets: 319 10*3/uL (ref 150–440)
Promyelocytes Absolute: 0 %
RBC: 3.3 MIL/uL — AB (ref 3.80–5.20)
RDW: 17.6 % — ABNORMAL HIGH (ref 11.5–14.5)
WBC: 6.5 10*3/uL (ref 3.6–11.0)
nRBC: 0 /100 WBC

## 2016-07-15 LAB — EXPECTORATED SPUTUM ASSESSMENT W GRAM STAIN, RFLX TO RESP C

## 2016-07-15 LAB — BASIC METABOLIC PANEL
Anion gap: 8 (ref 5–15)
BUN: 8 mg/dL (ref 6–20)
CO2: 23 mmol/L (ref 22–32)
CREATININE: 0.87 mg/dL (ref 0.44–1.00)
Calcium: 8.4 mg/dL — ABNORMAL LOW (ref 8.9–10.3)
Chloride: 108 mmol/L (ref 101–111)
GFR calc Af Amer: >60 mL/min (ref >60)
GFR calc non Af Amer: >60 mL/min (ref >60)
GLUCOSE: 135 mg/dL — AB (ref 65–99)
Potassium: 3.9 mmol/L (ref 3.5–5.1)
SODIUM: 139 mmol/L (ref 135–145)

## 2016-07-15 LAB — EXPECTORATED SPUTUM ASSESSMENT W REFEX TO RESP CULTURE

## 2016-07-15 MED ORDER — GUAIFENESIN-CODEINE 100-10 MG/5ML PO SYRP: 5.0000 mL | ORAL_SOLUTION | Freq: Three times a day (TID) | ORAL | 0 refills | Status: DC | PRN

## 2016-07-15 MED ORDER — AZITHROMYCIN 500 MG PO TABS
500.0000 mg | ORAL_TABLET | Freq: Once | ORAL | Status: AC
  Administered 2016-07-15: 500 mg via ORAL
  Filled 2016-07-15: qty 1

## 2016-07-15 MED ORDER — AZITHROMYCIN 250 MG PO TABS: ORAL_TABLET | ORAL | 0 refills | Status: DC

## 2016-07-15 NOTE — Progress Notes (Signed)
PT Refusal Note  Patient Details Name: Traci Hall MRN: [id number] DOB: [birth year]   Cancelled Treatment:    Reason Eval/Treat Not Completed: Patient declined, no reason specified. Chart reviewed. Attempted to see patient. Pt very pleasantly refuses PT evaluation at this time. She states that she is steady and strong on her feet and has no concerns regarding discharging home when necessary. Order will be completed. Please enter new order if status or needs change.   Lyndel Safe Huprich PT, DPT   Huprich,Jason 07/15/2016, 11:48 AM

## 2016-07-15 NOTE — Discharge Summary (Signed)
[facility] NAME: [place]    MR#:  [id number]  DATE OF BIRTH:  [birth year]  DATE OF ADMISSION:  07/14/2016   ADMITTING PHYSICIAN: Harvie Bridge, DO  DATE OF DISCHARGE: 07/15/2016  3:10 PM  PRIMARY CARE PHYSICIAN: [name], MD   ADMISSION DIAGNOSIS:   HCAP (healthcare-associated pneumonia) [J18.9]  DISCHARGE DIAGNOSIS:   Active Problems:   HCAP (healthcare-associated pneumonia)   SECONDARY DIAGNOSIS:   Past Medical History:  Diagnosis Date  . Allergic rhinitis   . Asthma   . Barrett's esophagus   . Cancer ([place])   . COPD (chronic obstructive pulmonary disease) ([place])    by prior PFTs  . GERD (gastroesophageal reflux disease)   . Hyperlipidemia   . Hypertension   . Obesity   . OSA on CPAP   . Personal history of tobacco use, presenting hazards to health 01/08/2015  . Polyp of colon     HOSPITAL COURSE:   71 year old female with past medical history significant for breast cancer currently on chemotherapy, CAD, COPD not on home oxygen, hypertension, hyperlipidemia, GERD, NASH, sleep apnea presents to hospital secondary to fevers and cough.  #1 acute viral bronchitis-low-grade fevers present, chest x-ray with bronchitis. -Blood cultures are negative. Urine analysis and urine cultures are negative. -Appreciate infectious disease consult. Being discharged on azithromycin. -Cough medications added as well -Received vancomycin and Zosyn in the hospital  #2 COPD/asthma-stable. Not on home oxygen. -Continue inhalers at home.  #3 breast cancer-diagnosed in September 2017. Status post partial mastectomy and sentinel node biopsy revealing papillary carcinoma. Follows at [facility]. -Continue chemotherapy as recommended and follow up with outpatient oncology.  #4 NASH-history of fatty liver in the past. Outpatient LFT follow-ups.  Patient has been stable. Very anxious to go home and is being discharged  home.    DISCHARGE CONDITIONS:   Guarded  CONSULTS OBTAINED:   Treatment Team:  Leonel Ramsay, MD  DRUG ALLERGIES:   Allergies  Allergen Reactions  . Alendronate Sodium     Leg cramps *FOSAMAX*  . Demerol     Over sensitivity  . Naproxen     Leg cramps an swelling    . Statins     Cramps in legs    DISCHARGE MEDICATIONS:   Allergies as of 07/15/2016      Reactions   Alendronate Sodium    Leg cramps *FOSAMAX*   Demerol    Over sensitivity   Naproxen    Leg cramps an swelling    Statins    Cramps in legs       Medication List    TAKE these medications   albuterol 108 (90 Base) MCG/ACT inhaler Commonly known as:  PROVENTIL HFA;VENTOLIN HFA Pt uses 2 puff qam  Only.   Give qty sufficient for 90 days   aspirin 81 MG tablet Take 81 mg by mouth daily.   azithromycin 250 MG tablet Commonly known as:  ZITHROMAX Z-PAK 2 tabs on the first day followed by 1 tab every day for a total of 5 days   bimatoprost 0.03 % ophthalmic solution Commonly known as:  LUMIGAN Place 1 drop into both eyes at bedtime.   BREO ELLIPTA 100-25 MCG/INH Aepb Generic drug:  fluticasone furoate-vilanterol USE 1 INHALATION DAILY   CENTRUM SILVER PO Take by mouth. Take 1 am   desloratadine 5 MG tablet Commonly known as:  CLARINEX Take 1 tablet (5 mg total) by mouth daily.  famotidine 40 MG tablet Commonly known as:  PEPCID Take 40 mg by mouth daily.   furosemide 20 MG tablet Commonly known as:  LASIX take 1 tablet by mouth once daily if needed   guaiFENesin-codeine 100-10 MG/5ML syrup Commonly known as:  ROBITUSSIN AC Take 5 mLs by mouth 3 (three) times daily as needed for cough.   hydrocortisone 25 MG suppository Commonly known as:  ANUSOL-HC Place 1 suppository (25 mg total) rectally 2 (two) times daily.   LORazepam 0.5 MG tablet Commonly known as:  ATIVAN Take 0.5 mg by mouth at bedtime. Following chemo   losartan 100 MG tablet Commonly known as:   COZAAR Take 1 tablet (100 mg total) by mouth daily.   montelukast 10 MG tablet Commonly known as:  SINGULAIR TAKE 1 TABLET AT BEDTIME   olopatadine 0.1 % ophthalmic solution Commonly known as:  PATANOL Place 1 drop into both eyes as needed.   pantoprazole 40 MG tablet Commonly known as:  PROTONIX Take 1 tablet (40 mg total) by mouth daily.   prochlorperazine 10 MG tablet Commonly known as:  COMPAZINE Take 10 mg by mouth every 6 (six) hours as needed for nausea or vomiting.   SYSTANE OP Apply to eye. 1-2 drops in each eye as needed.   vitamin C 500 MG tablet Commonly known as:  ASCORBIC ACID Take 500 mg by mouth daily.   Vitamin D-3 1000 units Caps Take 1 capsule by mouth daily.   vitamin E 400 UNIT capsule Generic drug:  vitamin E Take 400 Units by mouth daily.        DISCHARGE INSTRUCTIONS:   1. PCP f/u in 1 week 2. Oncology f/u as prior scheduled  DIET:   Cardiac diet  ACTIVITY:   Activity as tolerated  OXYGEN:   Home Oxygen: No.  Oxygen Delivery: room air  DISCHARGE LOCATION:   home   If you experience worsening of your admission symptoms, develop shortness of breath, life threatening emergency, suicidal or homicidal thoughts you must seek medical attention immediately by calling 911 or calling your MD immediately  if symptoms less severe.  You Must read complete instructions/literature along with all the possible adverse reactions/side effects for all the Medicines you take and that have been prescribed to you. Take any new Medicines after you have completely understood and accpet all the possible adverse reactions/side effects.   Please note  You were cared for by a hospitalist during your hospital stay. If you have any questions about your discharge medications or the care you received while you were in the hospital after you are discharged, you can call the unit and asked to speak with the hospitalist on call if the hospitalist that took care of  you is not available. Once you are discharged, your primary care physician will handle any further medical issues. Please note that NO REFILLS for any discharge medications will be authorized once you are discharged, as it is imperative that you return to your primary care physician (or establish a relationship with a primary care physician if you do not have one) for your aftercare needs so that they can reassess your need for medications and monitor your lab values.    On the day of Discharge:  VITAL SIGNS:   Blood pressure 138/61, pulse 96, temperature 99.1 F (37.3 C), temperature source Oral, resp. rate 20, height 5\' 2"  (1.575 m), weight 81.3 kg (179 lb 3.2 oz), SpO2 97 %.  PHYSICAL EXAMINATION:    GENERAL:  71 y.o.-year-old patient lying in the bed with no acute distress.  EYES: Pupils equal, round, reactive to light and accommodation. No scleral icterus. Extraocular muscles intact.  HEENT: Head atraumatic, normocephalic. Oropharynx and nasopharynx clear.  NECK:  Supple, no jugular venous distention. No thyroid enlargement, no tenderness.  LUNGS: Normal breath sounds bilaterally, no wheezing, rales,rhonchi or crepitation. No use of accessory muscles of respiration. Decreased bibasilar breath sounds. Left chest port present. CARDIOVASCULAR: S1, S2 normal. No rubs, or gallops. 2/6 systolic murmur present. ABDOMEN: Soft, non-tender, non-distended. Bowel sounds present. No organomegaly or mass.  EXTREMITIES: No pedal edema, cyanosis, or clubbing.  NEUROLOGIC: Cranial nerves II through XII are intact. Muscle strength 5/5 in all extremities. Sensation intact. Gait not checked.  PSYCHIATRIC: The patient is alert and oriented x 3.  SKIN: No obvious rash, lesion, or ulcer.   DATA REVIEW:   CBC  Recent Labs Lab 07/15/16 0322  WBC 6.5  HGB 10.1*  HCT 29.4*  PLT 319    Chemistries   Recent Labs Lab 07/14/16 1246 07/15/16 0322  NA 137 139  K 3.6 3.9  CL 105 108  CO2 26 23   GLUCOSE 182* 135*  BUN 7 8  CREATININE 0.77 0.87  CALCIUM 8.9 8.4*  AST 27  --   ALT 38  --   ALKPHOS 68  --   BILITOT 0.4  --      Microbiology Results  Results for orders placed or performed during the hospital encounter of 07/14/16  Blood culture (routine x 2)     Status: None (Preliminary result)   Collection Time: 07/14/16  2:37 PM  Result Value Ref Range Status   Specimen Description BLOOD L W IV  Final   Special Requests   Final    BOTTLES DRAWN AEROBIC AND ANAEROBIC AER 10ML ANA 11ML   Culture NO GROWTH < 24 HOURS  Final   Report Status PENDING  Incomplete  Blood culture (routine x 2)     Status: None (Preliminary result)   Collection Time: 07/14/16  2:37 PM  Result Value Ref Range Status   Specimen Description BLOOD L AC  Final   Special Requests BOTTLES DRAWN AEROBIC AND ANAEROBIC 9ML  Final   Culture NO GROWTH < 24 HOURS  Final   Report Status PENDING  Incomplete  Culture, sputum-assessment     Status: None   Collection Time: 07/15/16  6:28 AM  Result Value Ref Range Status   Specimen Description EXPECTORATED SPUTUM  Final   Special Requests Immunocompromised  Final   Sputum evaluation   Final    Sputum specimen not acceptable for testing.  Please recollect.     Report Status 07/15/2016 FINAL  Final    RADIOLOGY:  No results found.   Management plans discussed with the patient, family and they are in agreement.  CODE STATUS:     Code Status Orders        Start     Ordered   07/14/16 1724  Full code  Continuous     07/14/16 1724    Code Status History    Date Active Date Inactive Code Status Order ID Comments User Context   This patient has a current code status but no historical code status.    Advance Directive Documentation   Flowsheet Row Most Recent Value  Type of Advance Directive  Healthcare Power of Attorney  Pre-existing out of facility DNR order (yellow form or pink MOST form)  No data  "MOST"  Form in Place?  No data      TOTAL  TIME TAKING CARE OF THIS PATIENT: 37 minutes.    Gladstone Lighter M.D on 07/15/2016 at 5:21 PM  Between 7am to 6pm - Pager - [phone]  After 6pm go to [email or web address] - Proofreader  [facility]  Office  [phone]  CC: Primary care physician; [name], MD   Note: This dictation was prepared with Dragon dictation along with smaller phrase technology. Any transcriptional errors that result from this process are unintentional.

## 2016-07-15 NOTE — Progress Notes (Signed)
PROBLEMS: Mild COPD with asthmatic component  DATA: PFTs 12/31/13: normal Echocardiogram 06/22/16: Limited study. Normal ejection fraction 60 to 65%. Degenerative mitral valve disease  INTERVAL HISTORY: Continues on chemotherapy for recent dx of breast cancer - Taxol and herceptin  SUBJ: Treated with course of prednisone last visit with complaints of increased dyspnea. Now she is better with improved dyspnea and no new complaints  OBJ: Vitals:   07/10/16 1212  BP: 132/80  Pulse: (!) 114  SpO2: 98%  Weight: 180 lb (81.6 kg)   NAD @ rest HEENT WNL No JVD BS full, slightly coarse, no wheezes Tachy, regular, I/VI blowing systolic M NABS, soft No C/C/E  Current Outpatient Prescriptions on File Prior to Visit  Medication Sig Dispense Refill  . albuterol (PROVENTIL HFA;VENTOLIN HFA) 108 (90 Base) MCG/ACT inhaler Pt uses 2 puff qam  Only.   Give qty sufficient for 90 days 3 Inhaler 3  . aspirin 81 MG tablet Take 81 mg by mouth daily.    Marland Kitchen BREO ELLIPTA 100-25 MCG/INH AEPB USE 1 INHALATION DAILY 180 each 2  . Cholecalciferol (VITAMIN D-3) 1000 UNITS CAPS Take 1 capsule by mouth daily.    Marland Kitchen desloratadine (CLARINEX) 5 MG tablet Take 1 tablet (5 mg total) by mouth daily. 90 tablet 3  . famotidine (PEPCID) 40 MG tablet Take 40 mg by mouth daily.    . furosemide (LASIX) 20 MG tablet take 1 tablet by mouth once daily if needed 30 tablet 3  . hydrocortisone (ANUSOL-HC) 25 MG suppository Place 1 suppository (25 mg total) rectally 2 (two) times daily. 12 suppository 0  . losartan (COZAAR) 100 MG tablet Take 1 tablet (100 mg total) by mouth daily. 90 tablet 3  . montelukast (SINGULAIR) 10 MG tablet TAKE 1 TABLET AT BEDTIME 90 tablet 2  . Multiple Vitamins-Minerals (CENTRUM SILVER PO) Take by mouth. Take 1 am     . olopatadine (PATANOL) 0.1 % ophthalmic solution Place 1 drop into both eyes as needed.     . pantoprazole (PROTONIX) 40 MG tablet Take 1 tablet (40 mg total) by mouth daily. 90  tablet 3  . Polyethyl Glycol-Propyl Glycol (SYSTANE OP) Apply to eye. 1-2 drops in each eye as needed.    . vitamin C (ASCORBIC ACID) 500 MG tablet Take 500 mg by mouth daily.    . vitamin E (VITAMIN E) 400 UNIT capsule Take 400 Units by mouth daily.     No current facility-administered medications on file prior to visit.      DATA: No new data  IMPRESSION: 1) Mild COPD @ baseline 2) Recent mild asthmatic bronchitis - appears to have resolved  PLAN: Continue Breo and PRN albuterol ROV 3 months  Merton Border, MD PCCM service Mobile [phone] Pager [phone] 07/15/2016

## 2016-07-15 NOTE — Progress Notes (Signed)
Patient discharged home per MD order. Prescriptions given to patient. All discharge instructions given to patient and family and all questions answered.

## 2016-07-16 LAB — URINE CULTURE

## 2016-07-17 ENCOUNTER — Telehealth: Payer: Self-pay | Admitting: Internal Medicine

## 2016-07-17 NOTE — Telephone Encounter (Signed)
Transition Care Management Follow-up Telephone Call  How have you been since you were released from the hospital? I feel good just still running a fever of 101, alternating tylenol and Ibuprofen.   Do you understand why you were in the hospital? Yes   Do you understand the discharge instrcutions?Yes  Items Reviewed:  Medications reviewed: Yes  Allergies reviewed: Yes  Dietary changes reviewed: Yes  Referrals reviewed:Yes   Functional Questionnaire:   Activities of Daily Living (ADLs):   She states they are independent in the following: Patient is resting and staying bed using sponge bathes. States they require assistance with the following: Patient family is handling any activities patient cannot handle.   Any transportation issues/concerns?: No   Any patient concerns?Yes just the fever.   Confirmed importance and date/time of follow-up visits scheduled: Yes   Confirmed with patient if condition begins to worsen call PCP or go to the ER.  Patient was given the Call-a-Nurse line [phone]

## 2016-07-19 LAB — CULTURE, BLOOD (ROUTINE X 2)
CULTURE: NO GROWTH
Culture: NO GROWTH

## 2016-07-20 DIAGNOSIS — I2510 Atherosclerotic heart disease of native coronary artery without angina pectoris: Secondary | ICD-10-CM | POA: Diagnosis present

## 2016-07-20 DIAGNOSIS — B9620 Unspecified Escherichia coli [E. coli] as the cause of diseases classified elsewhere: Secondary | ICD-10-CM | POA: Diagnosis present

## 2016-07-20 DIAGNOSIS — R918 Other nonspecific abnormal finding of lung field: Secondary | ICD-10-CM | POA: Diagnosis not present

## 2016-07-20 DIAGNOSIS — J449 Chronic obstructive pulmonary disease, unspecified: Secondary | ICD-10-CM | POA: Diagnosis not present

## 2016-07-20 DIAGNOSIS — Z7982 Long term (current) use of aspirin: Secondary | ICD-10-CM | POA: Diagnosis not present

## 2016-07-20 DIAGNOSIS — G4733 Obstructive sleep apnea (adult) (pediatric): Secondary | ICD-10-CM | POA: Diagnosis present

## 2016-07-20 DIAGNOSIS — C50419 Malignant neoplasm of upper-outer quadrant of unspecified female breast: Secondary | ICD-10-CM | POA: Diagnosis present

## 2016-07-20 DIAGNOSIS — Z171 Estrogen receptor negative status [ER-]: Secondary | ICD-10-CM | POA: Diagnosis not present

## 2016-07-20 DIAGNOSIS — R509 Fever, unspecified: Secondary | ICD-10-CM | POA: Diagnosis not present

## 2016-07-20 DIAGNOSIS — C50411 Malignant neoplasm of upper-outer quadrant of right female breast: Secondary | ICD-10-CM | POA: Diagnosis not present

## 2016-07-20 DIAGNOSIS — E785 Hyperlipidemia, unspecified: Secondary | ICD-10-CM | POA: Diagnosis present

## 2016-07-20 DIAGNOSIS — G629 Polyneuropathy, unspecified: Secondary | ICD-10-CM | POA: Diagnosis present

## 2016-07-20 DIAGNOSIS — J811 Chronic pulmonary edema: Secondary | ICD-10-CM | POA: Diagnosis not present

## 2016-07-20 DIAGNOSIS — N390 Urinary tract infection, site not specified: Secondary | ICD-10-CM | POA: Diagnosis not present

## 2016-07-20 DIAGNOSIS — H409 Unspecified glaucoma: Secondary | ICD-10-CM | POA: Diagnosis present

## 2016-07-20 DIAGNOSIS — R739 Hyperglycemia, unspecified: Secondary | ICD-10-CM | POA: Diagnosis not present

## 2016-07-20 DIAGNOSIS — M1990 Unspecified osteoarthritis, unspecified site: Secondary | ICD-10-CM | POA: Diagnosis present

## 2016-07-20 DIAGNOSIS — R8271 Bacteriuria: Secondary | ICD-10-CM | POA: Diagnosis not present

## 2016-07-20 DIAGNOSIS — J208 Acute bronchitis due to other specified organisms: Secondary | ICD-10-CM | POA: Diagnosis present

## 2016-07-20 DIAGNOSIS — R0902 Hypoxemia: Secondary | ICD-10-CM | POA: Diagnosis not present

## 2016-07-20 DIAGNOSIS — Z853 Personal history of malignant neoplasm of breast: Secondary | ICD-10-CM | POA: Diagnosis not present

## 2016-07-20 DIAGNOSIS — J441 Chronic obstructive pulmonary disease with (acute) exacerbation: Secondary | ICD-10-CM | POA: Diagnosis not present

## 2016-07-20 DIAGNOSIS — J440 Chronic obstructive pulmonary disease with acute lower respiratory infection: Secondary | ICD-10-CM | POA: Diagnosis present

## 2016-07-20 DIAGNOSIS — K210 Gastro-esophageal reflux disease with esophagitis: Secondary | ICD-10-CM | POA: Diagnosis present

## 2016-07-20 DIAGNOSIS — I10 Essential (primary) hypertension: Secondary | ICD-10-CM | POA: Diagnosis present

## 2016-07-20 DIAGNOSIS — J90 Pleural effusion, not elsewhere classified: Secondary | ICD-10-CM | POA: Diagnosis not present

## 2016-07-24 ENCOUNTER — Ambulatory Visit (INDEPENDENT_AMBULATORY_CARE_PROVIDER_SITE_OTHER): Payer: Medicare Other | Admitting: Internal Medicine

## 2016-07-24 ENCOUNTER — Encounter: Payer: Self-pay | Admitting: Internal Medicine

## 2016-07-24 VITALS — BP 140/76 | HR 98 | Temp 98.1°F | Resp 16 | Wt 182.0 lb

## 2016-07-24 DIAGNOSIS — E119 Type 2 diabetes mellitus without complications: Secondary | ICD-10-CM | POA: Diagnosis not present

## 2016-07-24 DIAGNOSIS — Z09 Encounter for follow-up examination after completed treatment for conditions other than malignant neoplasm: Secondary | ICD-10-CM | POA: Diagnosis not present

## 2016-07-24 DIAGNOSIS — I2510 Atherosclerotic heart disease of native coronary artery without angina pectoris: Secondary | ICD-10-CM

## 2016-07-24 DIAGNOSIS — D649 Anemia, unspecified: Secondary | ICD-10-CM

## 2016-07-24 DIAGNOSIS — K7581 Nonalcoholic steatohepatitis (NASH): Secondary | ICD-10-CM

## 2016-07-24 DIAGNOSIS — E538 Deficiency of other specified B group vitamins: Secondary | ICD-10-CM

## 2016-07-24 LAB — POCT GLYCOSYLATED HEMOGLOBIN (HGB A1C): Hemoglobin A1C: 6.8

## 2016-07-24 MED ORDER — PANTOPRAZOLE SODIUM 40 MG PO TBEC: 40.0000 mg | DELAYED_RELEASE_TABLET | Freq: Two times a day (BID) | ORAL | 3 refills | Status: DC

## 2016-07-24 MED ORDER — ACCU-CHEK FASTCLIX LANCETS MISC: 0 refills | Status: DC

## 2016-07-24 MED ORDER — ACCU-CHEK MULTICLIX LANCETS MISC: 12 refills | Status: DC

## 2016-07-24 NOTE — Progress Notes (Signed)
Subjective:  Patient ID: Traci Hall, female    DOB: [birth year]  Age: 71 y.o. MRN: [id number]  CC: The primary encounter diagnosis was Controlled type 2 diabetes mellitus without complication, unspecified long term insulin use status ([place]). Diagnoses of NASH (nonalcoholic steatohepatitis), Anemia, unspecified type, B12 deficiency, and Hospital discharge follow-up were also pertinent to this visit.  HPI Traci Hall presents for hospital follow up,. She was admitted to [facility] on Jan 30th with CAP and discharged on Jan 31st to home.  Went to [facility] on Monday for scheduled chemotherapy for recently diagnosed breast cancer and  became acutely short of breath with uncontrolled  Cough.  She was admitted to [facility] on Monday and relesed on Thursday .  Since discharge from [facility] she has been taking prednisone 40 mg x 5 days,  And has 2 more days of levaquin.  Using supplemental oxygen with ambulation,  Room air sats are  86% at home without it.   Labs and chest x rays reviewed from both hospitalizations .  the cbc frrom [facility] on Feb 5th  Is notable for a drop in hgb to 8.5 down from 10.1 on jan 31 at [facility]      Outpatient Medications Prior to Visit  Medication Sig Dispense Refill  . albuterol (PROVENTIL HFA;VENTOLIN HFA) 108 (90 Base) MCG/ACT inhaler Pt uses 2 puff qam  Only.   Give qty sufficient for 90 days 3 Inhaler 3  . aspirin 81 MG tablet Take 81 mg by mouth daily.    . bimatoprost (LUMIGAN) 0.03 % ophthalmic solution Place 1 drop into both eyes at bedtime.    Marland Kitchen BREO ELLIPTA 100-25 MCG/INH AEPB USE 1 INHALATION DAILY 180 each 2  . Cholecalciferol (VITAMIN D-3) 1000 UNITS CAPS Take 1 capsule by mouth daily.    Marland Kitchen desloratadine (CLARINEX) 5 MG tablet Take 1 tablet (5 mg total) by mouth daily. 90 tablet 3  . famotidine (PEPCID) 40 MG tablet Take 40 mg by mouth daily.    . furosemide (LASIX) 20 MG tablet take 1 tablet by mouth once daily if needed 30 tablet 3  . hydrocortisone (ANUSOL-HC) 25 MG  suppository Place 1 suppository (25 mg total) rectally 2 (two) times daily. 12 suppository 0  . LORazepam (ATIVAN) 0.5 MG tablet Take 0.5 mg by mouth at bedtime. Following chemo    . losartan (COZAAR) 100 MG tablet Take 1 tablet (100 mg total) by mouth daily. 90 tablet 3  . montelukast (SINGULAIR) 10 MG tablet TAKE 1 TABLET AT BEDTIME 90 tablet 2  . Multiple Vitamins-Minerals (CENTRUM SILVER PO) Take by mouth. Take 1 am     . olopatadine (PATANOL) 0.1 % ophthalmic solution Place 1 drop into both eyes as needed.     [name] Glycol-Propyl Glycol (SYSTANE OP) Apply to eye. 1-2 drops in each eye as needed.    . prochlorperazine (COMPAZINE) 10 MG tablet Take 10 mg by mouth every 6 (six) hours as needed for nausea or vomiting.    . vitamin C (ASCORBIC ACID) 500 MG tablet Take 500 mg by mouth daily.    . vitamin E (VITAMIN E) 400 UNIT capsule Take 400 Units by mouth daily.    Marland Kitchen azithromycin (ZITHROMAX Z-PAK) 250 MG tablet 2 tabs on the first day followed by 1 tab every day for a total of 5 days 6 each 0  . pantoprazole (PROTONIX) 40 MG tablet Take 1 tablet (40 mg total) by mouth daily. 90 tablet 3  . guaiFENesin-codeine (  ROBITUSSIN AC) 100-10 MG/5ML syrup Take 5 mLs by mouth 3 (three) times daily as needed for cough. (Patient not taking: Reported on 07/24/2016) 118 mL 0   No facility-administered medications prior to visit.     Review of Systems;  Patient denies headache, fevers, malaise, unintentional weight loss, skin rash, eye pain, sinus congestion and sinus pain, sore throat, dysphagia,  hemoptysis , cough, dyspnea, wheezing, chest pain, palpitations, orthopnea, edema, abdominal pain, nausea, melena, diarrhea, constipation, flank pain, dysuria, hematuria, urinary  Frequency, nocturia, numbness, tingling, seizures,  Focal weakness, Loss of consciousness,  Tremor, insomnia, depression, anxiety, and suicidal ideation.      Objective:  BP 140/76   Pulse 98   Temp 98.1 F (36.7 C) (Oral)    Resp 16   Wt 182 lb (82.6 kg)   SpO2 96%   BMI 33.29 kg/m   BP Readings from Last 3 Encounters:  07/24/16 140/76  07/15/16 138/61  07/10/16 132/80    Wt Readings from Last 3 Encounters:  07/24/16 182 lb (82.6 kg)  07/14/16 179 lb 3.2 oz (81.3 kg)  07/10/16 180 lb (81.6 kg)    General appearance: alert, cooperative and appears stated age Ears: normal TM's and external ear canals both ears Throat: lips, mucosa, and tongue normal; teeth and gums normal Neck: no adenopathy, no carotid bruit, supple, symmetrical, trachea midline and thyroid not enlarged, symmetric, no tenderness/mass/nodules Back: symmetric, no curvature. ROM normal. No CVA tenderness. Lungs: clear to auscultation bilaterally Heart: regular rate and rhythm, S1, S2 normal, no murmur, click, rub or gallop Abdomen: soft, non-tender; bowel sounds normal; no masses,  no organomegaly Pulses: 2+ and symmetric Skin: Skin color, texture, turgor normal. No rashes or lesions Lymph nodes: Cervical, supraclavicular, and axillary nodes normal.  Lab Results  Component Value Date   HGBA1C 6.8% 07/24/2016   HGBA1C 6.3 11/27/2015   HGBA1C 6.2 04/22/2015    Lab Results  Component Value Date   CREATININE 0.87 07/15/2016   CREATININE 0.77 07/14/2016   CREATININE 0.85 11/27/2015    Lab Results  Component Value Date   WBC 6.5 07/15/2016   HGB 10.1 (L) 07/15/2016   HCT 29.4 (L) 07/15/2016   PLT 319 07/15/2016   GLUCOSE 135 (H) 07/15/2016   CHOL 234 (H) 11/27/2015   TRIG 178.0 (H) 11/27/2015   HDL 45.30 11/27/2015   LDLDIRECT 169.0 11/27/2015   LDLCALC 153 (H) 11/27/2015   ALT 38 07/14/2016   AST 27 07/14/2016   NA 139 07/15/2016   K 3.9 07/15/2016   CL 108 07/15/2016   CREATININE 0.87 07/15/2016   BUN 8 07/15/2016   CO2 23 07/15/2016   TSH 3.17 11/23/2013   HGBA1C 6.8% 07/24/2016   MICROALBUR <0.7 11/27/2015    Dg Chest 2 View  Result Date: 07/14/2016 CLINICAL DATA:  Onset of fever 4 days ago. The patient is  undergoing chemotherapy for right-sided breast malignancy. History of asthma -COPD, hypertension, former smoker. EXAM: CHEST  2 VIEW COMPARISON:  Chest x-ray of July 26, 2011 and CT scan of the chest of January 22, 2016. FINDINGS: The lungs are mildly hyperinflated. The interstitial markings are coarse and more conspicuous than in the past. The heart is top-normal in size. The pulmonary vascularity is normal. The porta catheter tip projects over the midportion of the SVC. There is no pleural effusion. The mediastinum is normal in width. The bony thorax exhibits no acute abnormality. IMPRESSION: COPD. Mild interstitial prominence suggests superimposed acute bronchitis or early interstitial pneumonia. There is  no alveolar pneumonia nor definite CHF. Followup PA and lateral chest X-ray is recommended in 3-4 weeks following trial of antibiotic therapy to ensure resolution and exclude underlying malignancy. Electronically Signed   By: David  Martinique M.D.   On: 07/14/2016 15:50    Assessment & Plan:   Problem List Items Addressed This Visit    Anemia    New onset, likely chemotherapy induced with recent drop to 8.5 .noted since she left [facility] and was admitted to [facility]. Repeat hgb is still pending and patient will contact her oncologist if it has dropped < 8   Lab Results  Component Value Date   WBC 6.5 07/15/2016   HGB 10.1 (L) 07/15/2016   HCT 29.4 (L) 07/15/2016   MCV 89.1 07/15/2016   PLT 319 07/15/2016         Relevant Orders   CBC with Differential/Platelet   DM II (diabetes mellitus, type II), controlled ([place]) - Primary    Diet controlled since 2014 with normoglycemia until recent treatment with steroids for COPD exacerbation. A1c still excellent.  No meds needed.  Diet reviewed in detail today with patient.   Lab Results  Component Value Date   HGBA1C 6.8% 07/24/2016   Lab Results  Component Value Date   MICROALBUR <0.7 11/27/2015         Relevant Orders   Hemoglobin A1c   POCT  HgB A1C (Completed)   Hospital discharge follow-up    Patient was discharged from [facility] and readmitted with COPD exacerbation at [facility] less than 3 days later. Patient is stable post discharge and has worsening anemia by review of labs via the EPIC portal. Given the rapid drop an no follow up planned for 2 weeks, I have repeated her CBC today, which is still pending at present.  All issues and questions about discharge plans were answered at the visit today . All labs , imaging studies and progress notes from admission were reviewed with patient today        NASH (nonalcoholic steatohepatitis)    . Continue lifestyle modifications with goal weight loss of 10% over the next 6 months using exercise and a low glycemic index diet. She has been vaccinated against hep a and b.   Lab Results  Component Value Date   ALT 38 07/14/2016   AST 27 07/14/2016   ALKPHOS 68 07/14/2016   BILITOT 0.4 07/14/2016          Other Visit Diagnoses    B12 deficiency        A total of 40 minutes was spent with patient more than half of which was spent in counseling patient on the above mentioned issues , reviewing and explaining recent labs and imaging studies done, and coordination of care.  I have discontinued Ms. [place]'s azithromycin. I have also changed her pantoprazole. Additionally, I am having her start on ACCU-CHEK FASTCLIX LANCETS and accu-chek multiclix. Lastly, I am having her maintain her aspirin, olopatadine, Multiple Vitamins-Minerals (CENTRUM SILVER PO), vitamin C, vitamin E, Polyethyl Glycol-Propyl Glycol (SYSTANE OP), famotidine, Vitamin D-3, hydrocortisone, furosemide, desloratadine, albuterol, montelukast, BREO ELLIPTA, losartan, bimatoprost, LORazepam, prochlorperazine, guaiFENesin-codeine, predniSONE, levofloxacin, and OXYGEN.  Meds ordered this encounter  Medications  . predniSONE (DELTASONE) 20 MG tablet    Sig: Take 40 mg by mouth daily.  Marland Kitchen levofloxacin (LEVAQUIN) 500 MG tablet    Sig:  Take 500 mg by mouth daily.  . OXYGEN    Sig: Inhale 4 L into the lungs as needed.  Marland Kitchen  pantoprazole (PROTONIX) 40 MG tablet    Sig: Take 1 tablet (40 mg total) by mouth 2 (two) times daily.    Dispense:  180 tablet    Refill:  3  . ACCU-CHEK FASTCLIX LANCETS MISC    Sig: Use once or twice daily to check blood sugars    Dispense:  102 each    Refill:  0  . Lancets (ACCU-CHEK MULTICLIX) lancets    Sig: Use as instructed    Dispense:  100 each    Refill:  12    Medications Discontinued During This Encounter  Medication Reason  . azithromycin (ZITHROMAX Z-PAK) 250 MG tablet Completed Course  . pantoprazole (PROTONIX) 40 MG tablet Reorder    Follow-up: No Follow-up on file.   [name], MD

## 2016-07-24 NOTE — Progress Notes (Signed)
Pre visit review using our clinic review tool, if applicable. No additional management support is needed unless otherwise documented below in the visit note. 

## 2016-07-24 NOTE — Patient Instructions (Addendum)
Here are some protein bars that are lower in sugar than your [place] bars so they won't drive your sugars up :  KIND Low glycemic index (5 g sugar) bars  Atkins (all of them)  New flavors,  Non chocolate flavors!! Lemon Bars, vanilla pecan, and blue berry yogurt bars   You might also want to try a premixed protein drink called Premier Protein shake for breakfast or late night snack . It is great tasting,   very low sugar and available of < $2 serving at [facility] and  In bulk for $1.50/serving at [employer] and [employer]  .   Better tasting than Atkins especially the CARAMEL FLAVOR   Nutritional analysis :  160 cal  30 g protein  1 g sugar 50% calcium needs   [employer] and [employer]  If your hemoglobin has dropped below 8,  You need to let your oncologist know (DON'T ASSUME IT'S OK TO WAIT 2 WEEKS)

## 2016-07-25 LAB — CBC WITH DIFFERENTIAL/PLATELET
BASOS ABS: 168 {cells}/uL (ref 0–200)
BASOS PCT: 1 %
EOS ABS: 0 {cells}/uL — AB (ref 15–500)
Eosinophils Relative: 0 %
HEMATOCRIT: 30.7 % — AB (ref 35.0–45.0)
HEMOGLOBIN: 9.8 g/dL — AB (ref 11.7–15.5)
LYMPHS ABS: 2856 {cells}/uL (ref 850–3900)
Lymphocytes Relative: 17 %
MCH: 28.7 pg (ref 27.0–33.0)
MCHC: 31.9 g/dL — ABNORMAL LOW (ref 32.0–36.0)
MCV: 89.8 fL (ref 80.0–100.0)
MONO ABS: 672 {cells}/uL (ref 200–950)
MPV: 9.6 fL (ref 7.5–12.5)
Monocytes Relative: 4 %
NEUTROS ABS: 13104 {cells}/uL — AB (ref 1500–7800)
Neutrophils Relative %: 78 %
Platelets: 524 10*3/uL — ABNORMAL HIGH (ref 140–400)
RBC: 3.42 MIL/uL — ABNORMAL LOW (ref 3.80–5.10)
RDW: 17.3 % — ABNORMAL HIGH (ref 11.0–15.0)
WBC: 16.8 10*3/uL — ABNORMAL HIGH (ref 3.8–10.8)

## 2016-07-25 LAB — HEMOGLOBIN A1C
Hgb A1c MFr Bld: 6.8 % — ABNORMAL HIGH (ref <5.7)
Mean Plasma Glucose: 148 mg/dL

## 2016-07-26 DIAGNOSIS — Z09 Encounter for follow-up examination after completed treatment for conditions other than malignant neoplasm: Secondary | ICD-10-CM | POA: Insufficient documentation

## 2016-07-26 DIAGNOSIS — D649 Anemia, unspecified: Secondary | ICD-10-CM | POA: Insufficient documentation

## 2016-07-26 NOTE — Assessment & Plan Note (Addendum)
.   Continue lifestyle modifications with goal weight loss of 10% over the next 6 months using exercise and a low glycemic index diet. She has been vaccinated against hep a and b.   Lab Results  Component Value Date   ALT 38 07/14/2016   AST 27 07/14/2016   ALKPHOS 68 07/14/2016   BILITOT 0.4 07/14/2016

## 2016-07-26 NOTE — Assessment & Plan Note (Signed)
New onset, likely chemotherapy induced with recent drop to 8.5 .noted since she left [facility] and was admitted to [facility]. Repeat hgb is still pending and patient will contact her oncologist if it has dropped < 8   Lab Results  Component Value Date   WBC 6.5 07/15/2016   HGB 10.1 (L) 07/15/2016   HCT 29.4 (L) 07/15/2016   MCV 89.1 07/15/2016   PLT 319 07/15/2016

## 2016-07-26 NOTE — Assessment & Plan Note (Signed)
Patient was discharged from [facility] and readmitted with COPD exacerbation at [facility] less than 3 days later. Patient is stable post discharge and has worsening anemia by review of labs via the EPIC portal. Given the rapid drop an no follow up planned for 2 weeks, I have repeated her CBC today, which is still pending at present.  All issues and questions about discharge plans were answered at the visit today . All labs , imaging studies and progress notes from admission were reviewed with patient today

## 2016-07-26 NOTE — Assessment & Plan Note (Addendum)
Diet controlled since 2014 with normoglycemia until recent treatment with steroids for COPD exacerbation. A1c still excellent.  No meds needed.  Diet reviewed in detail today with patient.   Lab Results  Component Value Date   HGBA1C 6.8% 07/24/2016   Lab Results  Component Value Date   MICROALBUR <0.7 11/27/2015

## 2016-07-27 LAB — PATHOLOGIST SMEAR REVIEW

## 2016-07-29 ENCOUNTER — Encounter: Payer: Self-pay | Admitting: Internal Medicine

## 2016-07-31 ENCOUNTER — Ambulatory Visit (INDEPENDENT_AMBULATORY_CARE_PROVIDER_SITE_OTHER): Payer: Medicare Other | Admitting: Pulmonary Disease

## 2016-07-31 ENCOUNTER — Encounter: Payer: Self-pay | Admitting: Pulmonary Disease

## 2016-07-31 VITALS — BP 130/82 | HR 93 | Wt 180.0 lb

## 2016-07-31 DIAGNOSIS — J441 Chronic obstructive pulmonary disease with (acute) exacerbation: Secondary | ICD-10-CM

## 2016-07-31 DIAGNOSIS — I2510 Atherosclerotic heart disease of native coronary artery without angina pectoris: Secondary | ICD-10-CM

## 2016-07-31 DIAGNOSIS — J449 Chronic obstructive pulmonary disease, unspecified: Secondary | ICD-10-CM | POA: Diagnosis not present

## 2016-07-31 NOTE — Patient Instructions (Signed)
Continue Breo inhaler Continue Singulair Continue albuterol as needed Follow up in 3 months or as needed

## 2016-07-31 NOTE — Progress Notes (Signed)
PROBLEMS: Mild COPD with asthmatic component  DATA: PFTs 12/31/13: normal Echocardiogram 06/22/16: Limited study. Normal ejection fraction 60 to 65%. Degenerative mitral valve disease  INTERVAL HISTORY: Shortly after last visit, developed a "virus" and and was hospitalized @ [facility] 01/30-01/31/18. Then went for chemo 02/05 at [facility] and was coughing - therefore admitted there for AECOPD. Discharged home on O2  SUBJ: As above. She is now back to her baseline. She is maintained on Breo and montelukast. She rarely uses albuterol rescue inhaler. She is not wearing O2 much - was instructed to use with exertion however, she notes that her SpO2 is never < 94% on her home oximeter.   OBJ: Vitals:   07/31/16 1004  BP: 130/82  Pulse: 93  SpO2: 99%  Weight: 180 lb (81.6 kg)   NAD @ rest HEENT WNL No JVD BS full, no wheezes Regular, I/VI blowing systolic M NABS, soft No C/C/E  Current Outpatient Prescriptions on File Prior to Visit  Medication Sig Dispense Refill  . ACCU-CHEK FASTCLIX LANCETS MISC Use once or twice daily to check blood sugars 102 each 0  . albuterol (PROVENTIL HFA;VENTOLIN HFA) 108 (90 Base) MCG/ACT inhaler Pt uses 2 puff qam  Only.   Give qty sufficient for 90 days 3 Inhaler 3  . aspirin 81 MG tablet Take 81 mg by mouth daily.    . bimatoprost (LUMIGAN) 0.03 % ophthalmic solution Place 1 drop into both eyes at bedtime.    Marland Kitchen BREO ELLIPTA 100-25 MCG/INH AEPB USE 1 INHALATION DAILY 180 each 2  . Cholecalciferol (VITAMIN D-3) 1000 UNITS CAPS Take 1 capsule by mouth daily.    Marland Kitchen desloratadine (CLARINEX) 5 MG tablet Take 1 tablet (5 mg total) by mouth daily. 90 tablet 3  . furosemide (LASIX) 20 MG tablet take 1 tablet by mouth once daily if needed 30 tablet 3  . guaiFENesin-codeine (ROBITUSSIN AC) 100-10 MG/5ML syrup Take 5 mLs by mouth 3 (three) times daily as needed for cough. 118 mL 0  . hydrocortisone (ANUSOL-HC) 25 MG suppository Place 1 suppository (25 mg total) rectally 2  (two) times daily. 12 suppository 0  . Lancets (ACCU-CHEK MULTICLIX) lancets Use as instructed 100 each 12  . LORazepam (ATIVAN) 0.5 MG tablet Take 0.5 mg by mouth at bedtime. Following chemo    . losartan (COZAAR) 100 MG tablet Take 1 tablet (100 mg total) by mouth daily. 90 tablet 3  . montelukast (SINGULAIR) 10 MG tablet TAKE 1 TABLET AT BEDTIME 90 tablet 2  . Multiple Vitamins-Minerals (CENTRUM SILVER PO) Take by mouth. Take 1 am     . olopatadine (PATANOL) 0.1 % ophthalmic solution Place 1 drop into both eyes as needed.     . OXYGEN Inhale 4 L into the lungs as needed.    . pantoprazole (PROTONIX) 40 MG tablet Take 1 tablet (40 mg total) by mouth 2 (two) times daily. 180 tablet 3  . Polyethyl Glycol-Propyl Glycol (SYSTANE OP) Apply to eye. 1-2 drops in each eye as needed.    . prochlorperazine (COMPAZINE) 10 MG tablet Take 10 mg by mouth every 6 (six) hours as needed for nausea or vomiting.    . vitamin C (ASCORBIC ACID) 500 MG tablet Take 500 mg by mouth daily.    . vitamin E (VITAMIN E) 400 UNIT capsule Take 400 Units by mouth daily.     No current facility-administered medications on file prior to visit.      DATA: No new data  IMPRESSION: 1) Mild  COPD @ baseline 2) Recent mild asthmatic bronchitis - appears to have resolved  PLAN: Continue Breo inhaler Continue Singulair Continue albuterol as needed Follow up in 3 months or as needed  Merton Border, MD PCCM service Mobile [phone] Pager [phone] 07/31/2016

## 2016-08-03 DIAGNOSIS — Z85828 Personal history of other malignant neoplasm of skin: Secondary | ICD-10-CM | POA: Diagnosis not present

## 2016-08-03 DIAGNOSIS — Z7952 Long term (current) use of systemic steroids: Secondary | ICD-10-CM | POA: Diagnosis not present

## 2016-08-03 DIAGNOSIS — K219 Gastro-esophageal reflux disease without esophagitis: Secondary | ICD-10-CM | POA: Diagnosis not present

## 2016-08-03 DIAGNOSIS — Z5111 Encounter for antineoplastic chemotherapy: Secondary | ICD-10-CM | POA: Diagnosis not present

## 2016-08-03 DIAGNOSIS — K7581 Nonalcoholic steatohepatitis (NASH): Secondary | ICD-10-CM | POA: Diagnosis not present

## 2016-08-03 DIAGNOSIS — Z87891 Personal history of nicotine dependence: Secondary | ICD-10-CM | POA: Diagnosis not present

## 2016-08-03 DIAGNOSIS — C50411 Malignant neoplasm of upper-outer quadrant of right female breast: Secondary | ICD-10-CM | POA: Diagnosis not present

## 2016-08-03 DIAGNOSIS — E7800 Pure hypercholesterolemia, unspecified: Secondary | ICD-10-CM | POA: Diagnosis not present

## 2016-08-03 DIAGNOSIS — C50511 Malignant neoplasm of lower-outer quadrant of right female breast: Secondary | ICD-10-CM | POA: Diagnosis not present

## 2016-08-03 DIAGNOSIS — Z5112 Encounter for antineoplastic immunotherapy: Secondary | ICD-10-CM | POA: Diagnosis not present

## 2016-08-03 DIAGNOSIS — Z171 Estrogen receptor negative status [ER-]: Secondary | ICD-10-CM | POA: Diagnosis not present

## 2016-08-03 DIAGNOSIS — Z7982 Long term (current) use of aspirin: Secondary | ICD-10-CM | POA: Diagnosis not present

## 2016-08-03 DIAGNOSIS — G620 Drug-induced polyneuropathy: Secondary | ICD-10-CM | POA: Diagnosis not present

## 2016-08-03 DIAGNOSIS — M4682 Other specified inflammatory spondylopathies, cervical region: Secondary | ICD-10-CM | POA: Diagnosis not present

## 2016-08-03 DIAGNOSIS — T451X5D Adverse effect of antineoplastic and immunosuppressive drugs, subsequent encounter: Secondary | ICD-10-CM | POA: Diagnosis not present

## 2016-08-03 DIAGNOSIS — Z7951 Long term (current) use of inhaled steroids: Secondary | ICD-10-CM | POA: Diagnosis not present

## 2016-08-03 DIAGNOSIS — M19042 Primary osteoarthritis, left hand: Secondary | ICD-10-CM | POA: Diagnosis not present

## 2016-08-03 DIAGNOSIS — I10 Essential (primary) hypertension: Secondary | ICD-10-CM | POA: Diagnosis not present

## 2016-08-03 DIAGNOSIS — Z92241 Personal history of systemic steroid therapy: Secondary | ICD-10-CM | POA: Diagnosis not present

## 2016-08-03 DIAGNOSIS — J449 Chronic obstructive pulmonary disease, unspecified: Secondary | ICD-10-CM | POA: Diagnosis not present

## 2016-08-03 DIAGNOSIS — G4730 Sleep apnea, unspecified: Secondary | ICD-10-CM | POA: Diagnosis not present

## 2016-08-03 DIAGNOSIS — Z8619 Personal history of other infectious and parasitic diseases: Secondary | ICD-10-CM | POA: Diagnosis not present

## 2016-08-03 DIAGNOSIS — I2510 Atherosclerotic heart disease of native coronary artery without angina pectoris: Secondary | ICD-10-CM | POA: Diagnosis not present

## 2016-08-03 DIAGNOSIS — Z9011 Acquired absence of right breast and nipple: Secondary | ICD-10-CM | POA: Diagnosis not present

## 2016-08-10 DIAGNOSIS — Z171 Estrogen receptor negative status [ER-]: Secondary | ICD-10-CM | POA: Diagnosis not present

## 2016-08-10 DIAGNOSIS — Z885 Allergy status to narcotic agent status: Secondary | ICD-10-CM | POA: Diagnosis not present

## 2016-08-10 DIAGNOSIS — Z87891 Personal history of nicotine dependence: Secondary | ICD-10-CM | POA: Diagnosis not present

## 2016-08-10 DIAGNOSIS — I10 Essential (primary) hypertension: Secondary | ICD-10-CM | POA: Diagnosis not present

## 2016-08-10 DIAGNOSIS — J441 Chronic obstructive pulmonary disease with (acute) exacerbation: Secondary | ICD-10-CM | POA: Diagnosis not present

## 2016-08-10 DIAGNOSIS — G629 Polyneuropathy, unspecified: Secondary | ICD-10-CM | POA: Diagnosis not present

## 2016-08-10 DIAGNOSIS — Z7982 Long term (current) use of aspirin: Secondary | ICD-10-CM | POA: Diagnosis not present

## 2016-08-10 DIAGNOSIS — Z5111 Encounter for antineoplastic chemotherapy: Secondary | ICD-10-CM | POA: Diagnosis not present

## 2016-08-10 DIAGNOSIS — Z8249 Family history of ischemic heart disease and other diseases of the circulatory system: Secondary | ICD-10-CM | POA: Diagnosis not present

## 2016-08-10 DIAGNOSIS — C50511 Malignant neoplasm of lower-outer quadrant of right female breast: Secondary | ICD-10-CM | POA: Diagnosis not present

## 2016-08-10 DIAGNOSIS — J45909 Unspecified asthma, uncomplicated: Secondary | ICD-10-CM | POA: Diagnosis not present

## 2016-08-10 DIAGNOSIS — I2510 Atherosclerotic heart disease of native coronary artery without angina pectoris: Secondary | ICD-10-CM | POA: Diagnosis not present

## 2016-08-10 DIAGNOSIS — Z79899 Other long term (current) drug therapy: Secondary | ICD-10-CM | POA: Diagnosis not present

## 2016-08-10 DIAGNOSIS — K219 Gastro-esophageal reflux disease without esophagitis: Secondary | ICD-10-CM | POA: Diagnosis not present

## 2016-08-10 DIAGNOSIS — C50411 Malignant neoplasm of upper-outer quadrant of right female breast: Secondary | ICD-10-CM | POA: Diagnosis not present

## 2016-08-18 DIAGNOSIS — Z8249 Family history of ischemic heart disease and other diseases of the circulatory system: Secondary | ICD-10-CM | POA: Diagnosis not present

## 2016-08-18 DIAGNOSIS — I2510 Atherosclerotic heart disease of native coronary artery without angina pectoris: Secondary | ICD-10-CM | POA: Diagnosis not present

## 2016-08-18 DIAGNOSIS — J441 Chronic obstructive pulmonary disease with (acute) exacerbation: Secondary | ICD-10-CM | POA: Diagnosis not present

## 2016-08-18 DIAGNOSIS — J45909 Unspecified asthma, uncomplicated: Secondary | ICD-10-CM | POA: Diagnosis not present

## 2016-08-18 DIAGNOSIS — Z79899 Other long term (current) drug therapy: Secondary | ICD-10-CM | POA: Diagnosis not present

## 2016-08-18 DIAGNOSIS — C50411 Malignant neoplasm of upper-outer quadrant of right female breast: Secondary | ICD-10-CM | POA: Diagnosis not present

## 2016-08-18 DIAGNOSIS — C50511 Malignant neoplasm of lower-outer quadrant of right female breast: Secondary | ICD-10-CM | POA: Diagnosis not present

## 2016-08-18 DIAGNOSIS — K219 Gastro-esophageal reflux disease without esophagitis: Secondary | ICD-10-CM | POA: Diagnosis not present

## 2016-08-18 DIAGNOSIS — I10 Essential (primary) hypertension: Secondary | ICD-10-CM | POA: Diagnosis not present

## 2016-08-18 DIAGNOSIS — Z885 Allergy status to narcotic agent status: Secondary | ICD-10-CM | POA: Diagnosis not present

## 2016-08-18 DIAGNOSIS — G629 Polyneuropathy, unspecified: Secondary | ICD-10-CM | POA: Diagnosis not present

## 2016-08-18 DIAGNOSIS — Z171 Estrogen receptor negative status [ER-]: Secondary | ICD-10-CM | POA: Diagnosis not present

## 2016-08-18 DIAGNOSIS — Z5111 Encounter for antineoplastic chemotherapy: Secondary | ICD-10-CM | POA: Diagnosis not present

## 2016-08-18 DIAGNOSIS — Z7982 Long term (current) use of aspirin: Secondary | ICD-10-CM | POA: Diagnosis not present

## 2016-08-18 DIAGNOSIS — Z87891 Personal history of nicotine dependence: Secondary | ICD-10-CM | POA: Diagnosis not present

## 2016-08-24 DIAGNOSIS — Z5112 Encounter for antineoplastic immunotherapy: Secondary | ICD-10-CM | POA: Diagnosis not present

## 2016-08-24 DIAGNOSIS — K219 Gastro-esophageal reflux disease without esophagitis: Secondary | ICD-10-CM | POA: Diagnosis not present

## 2016-08-24 DIAGNOSIS — Z5181 Encounter for therapeutic drug level monitoring: Secondary | ICD-10-CM | POA: Diagnosis not present

## 2016-08-24 DIAGNOSIS — I2510 Atherosclerotic heart disease of native coronary artery without angina pectoris: Secondary | ICD-10-CM | POA: Diagnosis not present

## 2016-08-24 DIAGNOSIS — R7989 Other specified abnormal findings of blood chemistry: Secondary | ICD-10-CM | POA: Diagnosis not present

## 2016-08-24 DIAGNOSIS — Z888 Allergy status to other drugs, medicaments and biological substances status: Secondary | ICD-10-CM | POA: Diagnosis not present

## 2016-08-24 DIAGNOSIS — C50911 Malignant neoplasm of unspecified site of right female breast: Secondary | ICD-10-CM | POA: Diagnosis not present

## 2016-08-24 DIAGNOSIS — I10 Essential (primary) hypertension: Secondary | ICD-10-CM | POA: Diagnosis not present

## 2016-08-24 DIAGNOSIS — G629 Polyneuropathy, unspecified: Secondary | ICD-10-CM | POA: Diagnosis not present

## 2016-08-24 DIAGNOSIS — J449 Chronic obstructive pulmonary disease, unspecified: Secondary | ICD-10-CM | POA: Diagnosis not present

## 2016-08-24 DIAGNOSIS — Z7982 Long term (current) use of aspirin: Secondary | ICD-10-CM | POA: Diagnosis not present

## 2016-08-24 DIAGNOSIS — C50411 Malignant neoplasm of upper-outer quadrant of right female breast: Secondary | ICD-10-CM | POA: Diagnosis not present

## 2016-08-24 DIAGNOSIS — Z79899 Other long term (current) drug therapy: Secondary | ICD-10-CM | POA: Diagnosis not present

## 2016-08-24 DIAGNOSIS — Z171 Estrogen receptor negative status [ER-]: Secondary | ICD-10-CM | POA: Diagnosis not present

## 2016-08-24 DIAGNOSIS — Z886 Allergy status to analgesic agent status: Secondary | ICD-10-CM | POA: Diagnosis not present

## 2016-08-24 DIAGNOSIS — Z87891 Personal history of nicotine dependence: Secondary | ICD-10-CM | POA: Diagnosis not present

## 2016-08-24 DIAGNOSIS — J45909 Unspecified asthma, uncomplicated: Secondary | ICD-10-CM | POA: Diagnosis not present

## 2016-08-24 DIAGNOSIS — Z7951 Long term (current) use of inhaled steroids: Secondary | ICD-10-CM | POA: Diagnosis not present

## 2016-08-24 DIAGNOSIS — Z9889 Other specified postprocedural states: Secondary | ICD-10-CM | POA: Diagnosis not present

## 2016-08-27 ENCOUNTER — Other Ambulatory Visit: Payer: Self-pay | Admitting: Internal Medicine

## 2016-08-27 MED ORDER — GLUCOSE BLOOD VI STRP: ORAL_STRIP | 3 refills | Status: DC

## 2016-08-27 NOTE — Progress Notes (Unsigned)
Lab Results  Component Value Date   HGBA1C 6.8% 07/24/2016

## 2016-08-31 DIAGNOSIS — Z5111 Encounter for antineoplastic chemotherapy: Secondary | ICD-10-CM | POA: Diagnosis not present

## 2016-08-31 DIAGNOSIS — Z87891 Personal history of nicotine dependence: Secondary | ICD-10-CM | POA: Diagnosis not present

## 2016-08-31 DIAGNOSIS — K219 Gastro-esophageal reflux disease without esophagitis: Secondary | ICD-10-CM | POA: Diagnosis not present

## 2016-08-31 DIAGNOSIS — I10 Essential (primary) hypertension: Secondary | ICD-10-CM | POA: Diagnosis not present

## 2016-08-31 DIAGNOSIS — I2510 Atherosclerotic heart disease of native coronary artery without angina pectoris: Secondary | ICD-10-CM | POA: Diagnosis not present

## 2016-08-31 DIAGNOSIS — J441 Chronic obstructive pulmonary disease with (acute) exacerbation: Secondary | ICD-10-CM | POA: Diagnosis not present

## 2016-08-31 DIAGNOSIS — Z8249 Family history of ischemic heart disease and other diseases of the circulatory system: Secondary | ICD-10-CM | POA: Diagnosis not present

## 2016-08-31 DIAGNOSIS — Z7982 Long term (current) use of aspirin: Secondary | ICD-10-CM | POA: Diagnosis not present

## 2016-08-31 DIAGNOSIS — C50511 Malignant neoplasm of lower-outer quadrant of right female breast: Secondary | ICD-10-CM | POA: Diagnosis not present

## 2016-08-31 DIAGNOSIS — Z171 Estrogen receptor negative status [ER-]: Secondary | ICD-10-CM | POA: Diagnosis not present

## 2016-08-31 DIAGNOSIS — Z885 Allergy status to narcotic agent status: Secondary | ICD-10-CM | POA: Diagnosis not present

## 2016-08-31 DIAGNOSIS — C50411 Malignant neoplasm of upper-outer quadrant of right female breast: Secondary | ICD-10-CM | POA: Diagnosis not present

## 2016-08-31 DIAGNOSIS — J45909 Unspecified asthma, uncomplicated: Secondary | ICD-10-CM | POA: Diagnosis not present

## 2016-08-31 DIAGNOSIS — Z79899 Other long term (current) drug therapy: Secondary | ICD-10-CM | POA: Diagnosis not present

## 2016-08-31 DIAGNOSIS — G629 Polyneuropathy, unspecified: Secondary | ICD-10-CM | POA: Diagnosis not present

## 2016-09-01 DIAGNOSIS — C50511 Malignant neoplasm of lower-outer quadrant of right female breast: Secondary | ICD-10-CM | POA: Diagnosis not present

## 2016-09-01 DIAGNOSIS — G629 Polyneuropathy, unspecified: Secondary | ICD-10-CM | POA: Diagnosis not present

## 2016-09-01 DIAGNOSIS — J441 Chronic obstructive pulmonary disease with (acute) exacerbation: Secondary | ICD-10-CM | POA: Diagnosis not present

## 2016-09-01 DIAGNOSIS — J45909 Unspecified asthma, uncomplicated: Secondary | ICD-10-CM | POA: Diagnosis not present

## 2016-09-01 DIAGNOSIS — I2510 Atherosclerotic heart disease of native coronary artery without angina pectoris: Secondary | ICD-10-CM | POA: Diagnosis not present

## 2016-09-01 DIAGNOSIS — K219 Gastro-esophageal reflux disease without esophagitis: Secondary | ICD-10-CM | POA: Diagnosis not present

## 2016-09-02 ENCOUNTER — Telehealth: Payer: Self-pay | Admitting: Internal Medicine

## 2016-09-02 DIAGNOSIS — J45909 Unspecified asthma, uncomplicated: Secondary | ICD-10-CM | POA: Diagnosis not present

## 2016-09-02 DIAGNOSIS — G629 Polyneuropathy, unspecified: Secondary | ICD-10-CM | POA: Diagnosis not present

## 2016-09-02 DIAGNOSIS — I2510 Atherosclerotic heart disease of native coronary artery without angina pectoris: Secondary | ICD-10-CM | POA: Diagnosis not present

## 2016-09-02 DIAGNOSIS — C50511 Malignant neoplasm of lower-outer quadrant of right female breast: Secondary | ICD-10-CM | POA: Diagnosis not present

## 2016-09-02 DIAGNOSIS — K219 Gastro-esophageal reflux disease without esophagitis: Secondary | ICD-10-CM | POA: Diagnosis not present

## 2016-09-02 DIAGNOSIS — J441 Chronic obstructive pulmonary disease with (acute) exacerbation: Secondary | ICD-10-CM | POA: Diagnosis not present

## 2016-09-02 NOTE — Telephone Encounter (Signed)
Placed detailed written order for new glucometer in quick sign for PCP to sign please fax after signature received , same for the order for lancets these must be completed for medicare to pay.

## 2016-09-03 DIAGNOSIS — J45909 Unspecified asthma, uncomplicated: Secondary | ICD-10-CM | POA: Diagnosis not present

## 2016-09-03 DIAGNOSIS — I2510 Atherosclerotic heart disease of native coronary artery without angina pectoris: Secondary | ICD-10-CM | POA: Diagnosis not present

## 2016-09-03 DIAGNOSIS — J441 Chronic obstructive pulmonary disease with (acute) exacerbation: Secondary | ICD-10-CM | POA: Diagnosis not present

## 2016-09-03 DIAGNOSIS — K219 Gastro-esophageal reflux disease without esophagitis: Secondary | ICD-10-CM | POA: Diagnosis not present

## 2016-09-03 DIAGNOSIS — G629 Polyneuropathy, unspecified: Secondary | ICD-10-CM | POA: Diagnosis not present

## 2016-09-03 DIAGNOSIS — C50511 Malignant neoplasm of lower-outer quadrant of right female breast: Secondary | ICD-10-CM | POA: Diagnosis not present

## 2016-09-03 NOTE — Telephone Encounter (Signed)
Faxed paperwork for glucometer.

## 2016-09-04 DIAGNOSIS — J441 Chronic obstructive pulmonary disease with (acute) exacerbation: Secondary | ICD-10-CM | POA: Diagnosis not present

## 2016-09-04 DIAGNOSIS — K219 Gastro-esophageal reflux disease without esophagitis: Secondary | ICD-10-CM | POA: Diagnosis not present

## 2016-09-04 DIAGNOSIS — C50511 Malignant neoplasm of lower-outer quadrant of right female breast: Secondary | ICD-10-CM | POA: Diagnosis not present

## 2016-09-04 DIAGNOSIS — I2510 Atherosclerotic heart disease of native coronary artery without angina pectoris: Secondary | ICD-10-CM | POA: Diagnosis not present

## 2016-09-04 DIAGNOSIS — G629 Polyneuropathy, unspecified: Secondary | ICD-10-CM | POA: Diagnosis not present

## 2016-09-04 DIAGNOSIS — J45909 Unspecified asthma, uncomplicated: Secondary | ICD-10-CM | POA: Diagnosis not present

## 2016-09-07 DIAGNOSIS — G629 Polyneuropathy, unspecified: Secondary | ICD-10-CM | POA: Diagnosis not present

## 2016-09-07 DIAGNOSIS — I2510 Atherosclerotic heart disease of native coronary artery without angina pectoris: Secondary | ICD-10-CM | POA: Diagnosis not present

## 2016-09-07 DIAGNOSIS — C50411 Malignant neoplasm of upper-outer quadrant of right female breast: Secondary | ICD-10-CM | POA: Diagnosis not present

## 2016-09-07 DIAGNOSIS — J45909 Unspecified asthma, uncomplicated: Secondary | ICD-10-CM | POA: Diagnosis not present

## 2016-09-07 DIAGNOSIS — J441 Chronic obstructive pulmonary disease with (acute) exacerbation: Secondary | ICD-10-CM | POA: Diagnosis not present

## 2016-09-07 DIAGNOSIS — K219 Gastro-esophageal reflux disease without esophagitis: Secondary | ICD-10-CM | POA: Diagnosis not present

## 2016-09-07 DIAGNOSIS — C50511 Malignant neoplasm of lower-outer quadrant of right female breast: Secondary | ICD-10-CM | POA: Diagnosis not present

## 2016-09-07 DIAGNOSIS — Z171 Estrogen receptor negative status [ER-]: Secondary | ICD-10-CM | POA: Diagnosis not present

## 2016-09-08 DIAGNOSIS — C50511 Malignant neoplasm of lower-outer quadrant of right female breast: Secondary | ICD-10-CM | POA: Diagnosis not present

## 2016-09-08 DIAGNOSIS — G629 Polyneuropathy, unspecified: Secondary | ICD-10-CM | POA: Diagnosis not present

## 2016-09-08 DIAGNOSIS — J45909 Unspecified asthma, uncomplicated: Secondary | ICD-10-CM | POA: Diagnosis not present

## 2016-09-08 DIAGNOSIS — I2510 Atherosclerotic heart disease of native coronary artery without angina pectoris: Secondary | ICD-10-CM | POA: Diagnosis not present

## 2016-09-08 DIAGNOSIS — K219 Gastro-esophageal reflux disease without esophagitis: Secondary | ICD-10-CM | POA: Diagnosis not present

## 2016-09-08 DIAGNOSIS — J441 Chronic obstructive pulmonary disease with (acute) exacerbation: Secondary | ICD-10-CM | POA: Diagnosis not present

## 2016-09-09 DIAGNOSIS — J45909 Unspecified asthma, uncomplicated: Secondary | ICD-10-CM | POA: Diagnosis not present

## 2016-09-09 DIAGNOSIS — J441 Chronic obstructive pulmonary disease with (acute) exacerbation: Secondary | ICD-10-CM | POA: Diagnosis not present

## 2016-09-09 DIAGNOSIS — C50511 Malignant neoplasm of lower-outer quadrant of right female breast: Secondary | ICD-10-CM | POA: Diagnosis not present

## 2016-09-09 DIAGNOSIS — I2510 Atherosclerotic heart disease of native coronary artery without angina pectoris: Secondary | ICD-10-CM | POA: Diagnosis not present

## 2016-09-09 DIAGNOSIS — G629 Polyneuropathy, unspecified: Secondary | ICD-10-CM | POA: Diagnosis not present

## 2016-09-09 DIAGNOSIS — K219 Gastro-esophageal reflux disease without esophagitis: Secondary | ICD-10-CM | POA: Diagnosis not present

## 2016-09-10 DIAGNOSIS — I2510 Atherosclerotic heart disease of native coronary artery without angina pectoris: Secondary | ICD-10-CM | POA: Diagnosis not present

## 2016-09-10 DIAGNOSIS — K219 Gastro-esophageal reflux disease without esophagitis: Secondary | ICD-10-CM | POA: Diagnosis not present

## 2016-09-10 DIAGNOSIS — G629 Polyneuropathy, unspecified: Secondary | ICD-10-CM | POA: Diagnosis not present

## 2016-09-10 DIAGNOSIS — J45909 Unspecified asthma, uncomplicated: Secondary | ICD-10-CM | POA: Diagnosis not present

## 2016-09-10 DIAGNOSIS — C50511 Malignant neoplasm of lower-outer quadrant of right female breast: Secondary | ICD-10-CM | POA: Diagnosis not present

## 2016-09-10 DIAGNOSIS — J441 Chronic obstructive pulmonary disease with (acute) exacerbation: Secondary | ICD-10-CM | POA: Diagnosis not present

## 2016-09-11 DIAGNOSIS — C50511 Malignant neoplasm of lower-outer quadrant of right female breast: Secondary | ICD-10-CM | POA: Diagnosis not present

## 2016-09-11 DIAGNOSIS — I2510 Atherosclerotic heart disease of native coronary artery without angina pectoris: Secondary | ICD-10-CM | POA: Diagnosis not present

## 2016-09-11 DIAGNOSIS — J45909 Unspecified asthma, uncomplicated: Secondary | ICD-10-CM | POA: Diagnosis not present

## 2016-09-11 DIAGNOSIS — G629 Polyneuropathy, unspecified: Secondary | ICD-10-CM | POA: Diagnosis not present

## 2016-09-11 DIAGNOSIS — Z171 Estrogen receptor negative status [ER-]: Secondary | ICD-10-CM | POA: Diagnosis not present

## 2016-09-11 DIAGNOSIS — K219 Gastro-esophageal reflux disease without esophagitis: Secondary | ICD-10-CM | POA: Diagnosis not present

## 2016-09-11 DIAGNOSIS — J441 Chronic obstructive pulmonary disease with (acute) exacerbation: Secondary | ICD-10-CM | POA: Diagnosis not present

## 2016-09-11 DIAGNOSIS — C50411 Malignant neoplasm of upper-outer quadrant of right female breast: Secondary | ICD-10-CM | POA: Diagnosis not present

## 2016-09-14 DIAGNOSIS — I10 Essential (primary) hypertension: Secondary | ICD-10-CM | POA: Diagnosis not present

## 2016-09-14 DIAGNOSIS — Z5112 Encounter for antineoplastic immunotherapy: Secondary | ICD-10-CM | POA: Diagnosis not present

## 2016-09-14 DIAGNOSIS — J441 Chronic obstructive pulmonary disease with (acute) exacerbation: Secondary | ICD-10-CM | POA: Diagnosis not present

## 2016-09-14 DIAGNOSIS — Z87891 Personal history of nicotine dependence: Secondary | ICD-10-CM | POA: Diagnosis not present

## 2016-09-14 DIAGNOSIS — Z8249 Family history of ischemic heart disease and other diseases of the circulatory system: Secondary | ICD-10-CM | POA: Diagnosis not present

## 2016-09-14 DIAGNOSIS — J45909 Unspecified asthma, uncomplicated: Secondary | ICD-10-CM | POA: Diagnosis not present

## 2016-09-14 DIAGNOSIS — G629 Polyneuropathy, unspecified: Secondary | ICD-10-CM | POA: Diagnosis not present

## 2016-09-14 DIAGNOSIS — Z7982 Long term (current) use of aspirin: Secondary | ICD-10-CM | POA: Diagnosis not present

## 2016-09-14 DIAGNOSIS — Z5111 Encounter for antineoplastic chemotherapy: Secondary | ICD-10-CM | POA: Diagnosis not present

## 2016-09-14 DIAGNOSIS — C50411 Malignant neoplasm of upper-outer quadrant of right female breast: Secondary | ICD-10-CM | POA: Diagnosis not present

## 2016-09-14 DIAGNOSIS — K219 Gastro-esophageal reflux disease without esophagitis: Secondary | ICD-10-CM | POA: Diagnosis not present

## 2016-09-14 DIAGNOSIS — Z79899 Other long term (current) drug therapy: Secondary | ICD-10-CM | POA: Diagnosis not present

## 2016-09-14 DIAGNOSIS — Z171 Estrogen receptor negative status [ER-]: Secondary | ICD-10-CM | POA: Diagnosis not present

## 2016-09-14 DIAGNOSIS — Z885 Allergy status to narcotic agent status: Secondary | ICD-10-CM | POA: Diagnosis not present

## 2016-09-14 DIAGNOSIS — I2510 Atherosclerotic heart disease of native coronary artery without angina pectoris: Secondary | ICD-10-CM | POA: Diagnosis not present

## 2016-09-14 DIAGNOSIS — C50511 Malignant neoplasm of lower-outer quadrant of right female breast: Secondary | ICD-10-CM | POA: Diagnosis not present

## 2016-09-15 DIAGNOSIS — I058 Other rheumatic mitral valve diseases: Secondary | ICD-10-CM | POA: Diagnosis not present

## 2016-09-15 DIAGNOSIS — Z5181 Encounter for therapeutic drug level monitoring: Secondary | ICD-10-CM | POA: Diagnosis not present

## 2016-09-15 DIAGNOSIS — I517 Cardiomegaly: Secondary | ICD-10-CM | POA: Diagnosis not present

## 2016-09-15 DIAGNOSIS — Z79899 Other long term (current) drug therapy: Secondary | ICD-10-CM | POA: Diagnosis not present

## 2016-09-16 DIAGNOSIS — G629 Polyneuropathy, unspecified: Secondary | ICD-10-CM | POA: Diagnosis not present

## 2016-09-16 DIAGNOSIS — Z7982 Long term (current) use of aspirin: Secondary | ICD-10-CM | POA: Diagnosis not present

## 2016-09-16 DIAGNOSIS — Z885 Allergy status to narcotic agent status: Secondary | ICD-10-CM | POA: Diagnosis not present

## 2016-09-16 DIAGNOSIS — Z87891 Personal history of nicotine dependence: Secondary | ICD-10-CM | POA: Diagnosis not present

## 2016-09-16 DIAGNOSIS — Z79899 Other long term (current) drug therapy: Secondary | ICD-10-CM | POA: Diagnosis not present

## 2016-09-16 DIAGNOSIS — J441 Chronic obstructive pulmonary disease with (acute) exacerbation: Secondary | ICD-10-CM | POA: Diagnosis not present

## 2016-09-16 DIAGNOSIS — J45909 Unspecified asthma, uncomplicated: Secondary | ICD-10-CM | POA: Diagnosis not present

## 2016-09-16 DIAGNOSIS — K219 Gastro-esophageal reflux disease without esophagitis: Secondary | ICD-10-CM | POA: Diagnosis not present

## 2016-09-16 DIAGNOSIS — Z5111 Encounter for antineoplastic chemotherapy: Secondary | ICD-10-CM | POA: Diagnosis not present

## 2016-09-16 DIAGNOSIS — Z171 Estrogen receptor negative status [ER-]: Secondary | ICD-10-CM | POA: Diagnosis not present

## 2016-09-16 DIAGNOSIS — Z5112 Encounter for antineoplastic immunotherapy: Secondary | ICD-10-CM | POA: Diagnosis not present

## 2016-09-16 DIAGNOSIS — I2510 Atherosclerotic heart disease of native coronary artery without angina pectoris: Secondary | ICD-10-CM | POA: Diagnosis not present

## 2016-09-16 DIAGNOSIS — I10 Essential (primary) hypertension: Secondary | ICD-10-CM | POA: Diagnosis not present

## 2016-09-16 DIAGNOSIS — C50411 Malignant neoplasm of upper-outer quadrant of right female breast: Secondary | ICD-10-CM | POA: Diagnosis not present

## 2016-09-16 DIAGNOSIS — C50511 Malignant neoplasm of lower-outer quadrant of right female breast: Secondary | ICD-10-CM | POA: Diagnosis not present

## 2016-09-16 DIAGNOSIS — Z8249 Family history of ischemic heart disease and other diseases of the circulatory system: Secondary | ICD-10-CM | POA: Diagnosis not present

## 2016-09-17 DIAGNOSIS — J441 Chronic obstructive pulmonary disease with (acute) exacerbation: Secondary | ICD-10-CM | POA: Diagnosis not present

## 2016-09-17 DIAGNOSIS — I2510 Atherosclerotic heart disease of native coronary artery without angina pectoris: Secondary | ICD-10-CM | POA: Diagnosis not present

## 2016-09-17 DIAGNOSIS — J45909 Unspecified asthma, uncomplicated: Secondary | ICD-10-CM | POA: Diagnosis not present

## 2016-09-17 DIAGNOSIS — G629 Polyneuropathy, unspecified: Secondary | ICD-10-CM | POA: Diagnosis not present

## 2016-09-17 DIAGNOSIS — C50511 Malignant neoplasm of lower-outer quadrant of right female breast: Secondary | ICD-10-CM | POA: Diagnosis not present

## 2016-09-17 DIAGNOSIS — K219 Gastro-esophageal reflux disease without esophagitis: Secondary | ICD-10-CM | POA: Diagnosis not present

## 2016-09-18 DIAGNOSIS — I2510 Atherosclerotic heart disease of native coronary artery without angina pectoris: Secondary | ICD-10-CM | POA: Diagnosis not present

## 2016-09-18 DIAGNOSIS — J441 Chronic obstructive pulmonary disease with (acute) exacerbation: Secondary | ICD-10-CM | POA: Diagnosis not present

## 2016-09-18 DIAGNOSIS — G629 Polyneuropathy, unspecified: Secondary | ICD-10-CM | POA: Diagnosis not present

## 2016-09-18 DIAGNOSIS — C50511 Malignant neoplasm of lower-outer quadrant of right female breast: Secondary | ICD-10-CM | POA: Diagnosis not present

## 2016-09-18 DIAGNOSIS — K219 Gastro-esophageal reflux disease without esophagitis: Secondary | ICD-10-CM | POA: Diagnosis not present

## 2016-09-18 DIAGNOSIS — J45909 Unspecified asthma, uncomplicated: Secondary | ICD-10-CM | POA: Diagnosis not present

## 2016-09-21 DIAGNOSIS — C50511 Malignant neoplasm of lower-outer quadrant of right female breast: Secondary | ICD-10-CM | POA: Diagnosis not present

## 2016-09-21 DIAGNOSIS — J441 Chronic obstructive pulmonary disease with (acute) exacerbation: Secondary | ICD-10-CM | POA: Diagnosis not present

## 2016-09-21 DIAGNOSIS — G629 Polyneuropathy, unspecified: Secondary | ICD-10-CM | POA: Diagnosis not present

## 2016-09-21 DIAGNOSIS — I2510 Atherosclerotic heart disease of native coronary artery without angina pectoris: Secondary | ICD-10-CM | POA: Diagnosis not present

## 2016-09-21 DIAGNOSIS — K219 Gastro-esophageal reflux disease without esophagitis: Secondary | ICD-10-CM | POA: Diagnosis not present

## 2016-09-21 DIAGNOSIS — Z171 Estrogen receptor negative status [ER-]: Secondary | ICD-10-CM | POA: Diagnosis not present

## 2016-09-21 DIAGNOSIS — J45909 Unspecified asthma, uncomplicated: Secondary | ICD-10-CM | POA: Diagnosis not present

## 2016-09-21 DIAGNOSIS — C50411 Malignant neoplasm of upper-outer quadrant of right female breast: Secondary | ICD-10-CM | POA: Diagnosis not present

## 2016-09-22 DIAGNOSIS — J441 Chronic obstructive pulmonary disease with (acute) exacerbation: Secondary | ICD-10-CM | POA: Diagnosis not present

## 2016-09-22 DIAGNOSIS — C50411 Malignant neoplasm of upper-outer quadrant of right female breast: Secondary | ICD-10-CM | POA: Diagnosis not present

## 2016-09-22 DIAGNOSIS — C50511 Malignant neoplasm of lower-outer quadrant of right female breast: Secondary | ICD-10-CM | POA: Diagnosis not present

## 2016-09-22 DIAGNOSIS — Z171 Estrogen receptor negative status [ER-]: Secondary | ICD-10-CM | POA: Diagnosis not present

## 2016-09-22 DIAGNOSIS — I2510 Atherosclerotic heart disease of native coronary artery without angina pectoris: Secondary | ICD-10-CM | POA: Diagnosis not present

## 2016-09-22 DIAGNOSIS — K219 Gastro-esophageal reflux disease without esophagitis: Secondary | ICD-10-CM | POA: Diagnosis not present

## 2016-09-22 DIAGNOSIS — G629 Polyneuropathy, unspecified: Secondary | ICD-10-CM | POA: Diagnosis not present

## 2016-09-22 DIAGNOSIS — J45909 Unspecified asthma, uncomplicated: Secondary | ICD-10-CM | POA: Diagnosis not present

## 2016-09-23 DIAGNOSIS — J441 Chronic obstructive pulmonary disease with (acute) exacerbation: Secondary | ICD-10-CM | POA: Diagnosis not present

## 2016-09-23 DIAGNOSIS — G629 Polyneuropathy, unspecified: Secondary | ICD-10-CM | POA: Diagnosis not present

## 2016-09-23 DIAGNOSIS — I2510 Atherosclerotic heart disease of native coronary artery without angina pectoris: Secondary | ICD-10-CM | POA: Diagnosis not present

## 2016-09-23 DIAGNOSIS — C50511 Malignant neoplasm of lower-outer quadrant of right female breast: Secondary | ICD-10-CM | POA: Diagnosis not present

## 2016-09-23 DIAGNOSIS — J45909 Unspecified asthma, uncomplicated: Secondary | ICD-10-CM | POA: Diagnosis not present

## 2016-09-23 DIAGNOSIS — K219 Gastro-esophageal reflux disease without esophagitis: Secondary | ICD-10-CM | POA: Diagnosis not present

## 2016-09-24 DIAGNOSIS — C50511 Malignant neoplasm of lower-outer quadrant of right female breast: Secondary | ICD-10-CM | POA: Diagnosis not present

## 2016-09-24 DIAGNOSIS — G629 Polyneuropathy, unspecified: Secondary | ICD-10-CM | POA: Diagnosis not present

## 2016-09-24 DIAGNOSIS — J441 Chronic obstructive pulmonary disease with (acute) exacerbation: Secondary | ICD-10-CM | POA: Diagnosis not present

## 2016-09-24 DIAGNOSIS — J45909 Unspecified asthma, uncomplicated: Secondary | ICD-10-CM | POA: Diagnosis not present

## 2016-09-24 DIAGNOSIS — I2510 Atherosclerotic heart disease of native coronary artery without angina pectoris: Secondary | ICD-10-CM | POA: Diagnosis not present

## 2016-09-24 DIAGNOSIS — K219 Gastro-esophageal reflux disease without esophagitis: Secondary | ICD-10-CM | POA: Diagnosis not present

## 2016-09-25 DIAGNOSIS — J45909 Unspecified asthma, uncomplicated: Secondary | ICD-10-CM | POA: Diagnosis not present

## 2016-09-25 DIAGNOSIS — K219 Gastro-esophageal reflux disease without esophagitis: Secondary | ICD-10-CM | POA: Diagnosis not present

## 2016-09-25 DIAGNOSIS — J441 Chronic obstructive pulmonary disease with (acute) exacerbation: Secondary | ICD-10-CM | POA: Diagnosis not present

## 2016-09-25 DIAGNOSIS — I2510 Atherosclerotic heart disease of native coronary artery without angina pectoris: Secondary | ICD-10-CM | POA: Diagnosis not present

## 2016-09-25 DIAGNOSIS — G629 Polyneuropathy, unspecified: Secondary | ICD-10-CM | POA: Diagnosis not present

## 2016-09-25 DIAGNOSIS — C50511 Malignant neoplasm of lower-outer quadrant of right female breast: Secondary | ICD-10-CM | POA: Diagnosis not present

## 2016-09-28 DIAGNOSIS — Z171 Estrogen receptor negative status [ER-]: Secondary | ICD-10-CM | POA: Diagnosis not present

## 2016-09-28 DIAGNOSIS — G629 Polyneuropathy, unspecified: Secondary | ICD-10-CM | POA: Diagnosis not present

## 2016-09-28 DIAGNOSIS — K219 Gastro-esophageal reflux disease without esophagitis: Secondary | ICD-10-CM | POA: Diagnosis not present

## 2016-09-28 DIAGNOSIS — I2510 Atherosclerotic heart disease of native coronary artery without angina pectoris: Secondary | ICD-10-CM | POA: Diagnosis not present

## 2016-09-28 DIAGNOSIS — C50411 Malignant neoplasm of upper-outer quadrant of right female breast: Secondary | ICD-10-CM | POA: Diagnosis not present

## 2016-09-28 DIAGNOSIS — J45909 Unspecified asthma, uncomplicated: Secondary | ICD-10-CM | POA: Diagnosis not present

## 2016-09-28 DIAGNOSIS — J441 Chronic obstructive pulmonary disease with (acute) exacerbation: Secondary | ICD-10-CM | POA: Diagnosis not present

## 2016-09-28 DIAGNOSIS — C50511 Malignant neoplasm of lower-outer quadrant of right female breast: Secondary | ICD-10-CM | POA: Diagnosis not present

## 2016-10-05 DIAGNOSIS — Z5112 Encounter for antineoplastic immunotherapy: Secondary | ICD-10-CM | POA: Diagnosis not present

## 2016-10-05 DIAGNOSIS — Z171 Estrogen receptor negative status [ER-]: Secondary | ICD-10-CM | POA: Diagnosis not present

## 2016-10-05 DIAGNOSIS — C50411 Malignant neoplasm of upper-outer quadrant of right female breast: Secondary | ICD-10-CM | POA: Diagnosis not present

## 2016-10-11 ENCOUNTER — Other Ambulatory Visit: Payer: Self-pay | Admitting: Internal Medicine

## 2016-10-26 ENCOUNTER — Ambulatory Visit: Payer: Medicare Other | Admitting: Pulmonary Disease

## 2016-10-26 DIAGNOSIS — Z5181 Encounter for therapeutic drug level monitoring: Secondary | ICD-10-CM | POA: Diagnosis not present

## 2016-10-26 DIAGNOSIS — K219 Gastro-esophageal reflux disease without esophagitis: Secondary | ICD-10-CM | POA: Diagnosis not present

## 2016-10-26 DIAGNOSIS — J45909 Unspecified asthma, uncomplicated: Secondary | ICD-10-CM | POA: Diagnosis not present

## 2016-10-26 DIAGNOSIS — K7581 Nonalcoholic steatohepatitis (NASH): Secondary | ICD-10-CM | POA: Diagnosis not present

## 2016-10-26 DIAGNOSIS — Z87891 Personal history of nicotine dependence: Secondary | ICD-10-CM | POA: Diagnosis not present

## 2016-10-26 DIAGNOSIS — Z171 Estrogen receptor negative status [ER-]: Secondary | ICD-10-CM | POA: Diagnosis not present

## 2016-10-26 DIAGNOSIS — Z888 Allergy status to other drugs, medicaments and biological substances status: Secondary | ICD-10-CM | POA: Diagnosis not present

## 2016-10-26 DIAGNOSIS — E7800 Pure hypercholesterolemia, unspecified: Secondary | ICD-10-CM | POA: Diagnosis not present

## 2016-10-26 DIAGNOSIS — Z5112 Encounter for antineoplastic immunotherapy: Secondary | ICD-10-CM | POA: Diagnosis not present

## 2016-10-26 DIAGNOSIS — J449 Chronic obstructive pulmonary disease, unspecified: Secondary | ICD-10-CM | POA: Diagnosis not present

## 2016-10-26 DIAGNOSIS — Z79899 Other long term (current) drug therapy: Secondary | ICD-10-CM | POA: Diagnosis not present

## 2016-10-26 DIAGNOSIS — Z7982 Long term (current) use of aspirin: Secondary | ICD-10-CM | POA: Diagnosis not present

## 2016-10-26 DIAGNOSIS — G4730 Sleep apnea, unspecified: Secondary | ICD-10-CM | POA: Diagnosis not present

## 2016-10-26 DIAGNOSIS — M1990 Unspecified osteoarthritis, unspecified site: Secondary | ICD-10-CM | POA: Diagnosis not present

## 2016-10-26 DIAGNOSIS — Z7951 Long term (current) use of inhaled steroids: Secondary | ICD-10-CM | POA: Diagnosis not present

## 2016-10-26 DIAGNOSIS — I10 Essential (primary) hypertension: Secondary | ICD-10-CM | POA: Diagnosis not present

## 2016-10-26 DIAGNOSIS — C50411 Malignant neoplasm of upper-outer quadrant of right female breast: Secondary | ICD-10-CM | POA: Diagnosis not present

## 2016-10-26 DIAGNOSIS — K2270 Barrett's esophagus without dysplasia: Secondary | ICD-10-CM | POA: Diagnosis not present

## 2016-10-26 DIAGNOSIS — I2510 Atherosclerotic heart disease of native coronary artery without angina pectoris: Secondary | ICD-10-CM | POA: Diagnosis not present

## 2016-11-06 ENCOUNTER — Ambulatory Visit (INDEPENDENT_AMBULATORY_CARE_PROVIDER_SITE_OTHER): Payer: Medicare Other | Admitting: Pulmonary Disease

## 2016-11-06 ENCOUNTER — Encounter: Payer: Self-pay | Admitting: Pulmonary Disease

## 2016-11-06 VITALS — BP 138/78 | HR 104 | Ht 62.0 in | Wt 181.0 lb

## 2016-11-06 DIAGNOSIS — J45909 Unspecified asthma, uncomplicated: Secondary | ICD-10-CM | POA: Diagnosis not present

## 2016-11-06 DIAGNOSIS — I2510 Atherosclerotic heart disease of native coronary artery without angina pectoris: Secondary | ICD-10-CM

## 2016-11-06 DIAGNOSIS — J449 Chronic obstructive pulmonary disease, unspecified: Secondary | ICD-10-CM | POA: Diagnosis not present

## 2016-11-06 NOTE — Patient Instructions (Signed)
You may try off of the Singulair and remain off if you notice no worsening of respiratory symptoms  Continue Breo inhaler and albuterol as needed  Follow-up in 3-4 months with PFTs

## 2016-11-09 NOTE — Progress Notes (Signed)
PROBLEMS: Mild COPD with asthmatic component  DATA: PFTs 12/31/13: normal Echocardiogram 06/22/16: Limited study. Normal ejection fraction 60 to 65%. Degenerative mitral valve disease  INTERVAL HISTORY: No major events. Has completed chemo and XRT. Feels much better ow  SUBJ: As above. She is at her baseline with minimal DOE/SOB. Remains on Breo and montelukast. She rarely uses albuterol rescue inhaler. Denies CP, fever, purulent sputum, hemoptysis, LE edema and calf tenderness.  OBJ: Vitals:   11/06/16 1044  BP: 138/78  Pulse: (!) 104  SpO2: 97%  Weight: 181 lb (82.1 kg)  Height: 5\' 2"  (1.575 m)   NAD @ rest HEENT WNL No JVD BS full, no wheezes Regular, I/VI systolic M NABS, soft No C/C/E  Current Outpatient Prescriptions on File Prior to Visit  Medication Sig Dispense Refill  . ACCU-CHEK FASTCLIX LANCETS MISC Use once or twice daily to check blood sugars 102 each 0  . albuterol (PROVENTIL HFA;VENTOLIN HFA) 108 (90 Base) MCG/ACT inhaler Pt uses 2 puff qam  Only.   Give qty sufficient for 90 days 3 Inhaler 3  . aspirin 81 MG tablet Take 81 mg by mouth daily.    . bimatoprost (LUMIGAN) 0.03 % ophthalmic solution Place 1 drop into both eyes at bedtime.    Marland Kitchen BREO ELLIPTA 100-25 MCG/INH AEPB USE 1 INHALATION DAILY 180 each 2  . Cholecalciferol (VITAMIN D-3) 1000 UNITS CAPS Take 1 capsule by mouth daily.    Marland Kitchen desloratadine (CLARINEX) 5 MG tablet TAKE 1 TABLET DAILY 90 tablet 3  . furosemide (LASIX) 20 MG tablet take 1 tablet by mouth once daily if needed 30 tablet 3  . hydrocortisone (ANUSOL-HC) 25 MG suppository Place 1 suppository (25 mg total) rectally 2 (two) times daily. 12 suppository 0  . montelukast (SINGULAIR) 10 MG tablet TAKE 1 TABLET AT BEDTIME 90 tablet 2  . Multiple Vitamins-Minerals (CENTRUM SILVER PO) Take by mouth. Take 1 am     . olopatadine (PATANOL) 0.1 % ophthalmic solution Place 1 drop into both eyes as needed.     . pantoprazole (PROTONIX) 40 MG tablet  Take 1 tablet (40 mg total) by mouth 2 (two) times daily. 180 tablet 3  . Polyethyl Glycol-Propyl Glycol (SYSTANE OP) Apply to eye. 1-2 drops in each eye as needed.    . vitamin C (ASCORBIC ACID) 500 MG tablet Take 500 mg by mouth daily.    . vitamin E (VITAMIN E) 400 UNIT capsule Take 400 Units by mouth daily.    Marland Kitchen losartan (COZAAR) 100 MG tablet Take 1 tablet (100 mg total) by mouth daily. 90 tablet 3   No current facility-administered medications on file prior to visit.      DATA: No new data  IMPRESSION: Mild COPD @ baseline with asthmatic component - well maintained on current regimen  PLAN: Continue Breo inhaler Instructed that she may try off of montelukast and remain off if no change in symptoms Continue albuterol as needed Follow up in 3-4 months with PFTs prior  Merton Border, MD PCCM service Mobile [phone] Pager [phone] 11/09/2016

## 2016-11-16 DIAGNOSIS — Z171 Estrogen receptor negative status [ER-]: Secondary | ICD-10-CM | POA: Diagnosis not present

## 2016-11-16 DIAGNOSIS — Z5112 Encounter for antineoplastic immunotherapy: Secondary | ICD-10-CM | POA: Diagnosis not present

## 2016-11-16 DIAGNOSIS — C50411 Malignant neoplasm of upper-outer quadrant of right female breast: Secondary | ICD-10-CM | POA: Diagnosis not present

## 2016-11-18 DIAGNOSIS — H8112 Benign paroxysmal vertigo, left ear: Secondary | ICD-10-CM | POA: Diagnosis not present

## 2016-11-24 DIAGNOSIS — D1800 Hemangioma unspecified site: Secondary | ICD-10-CM | POA: Diagnosis not present

## 2016-11-24 DIAGNOSIS — L720 Epidermal cyst: Secondary | ICD-10-CM | POA: Diagnosis not present

## 2016-11-24 DIAGNOSIS — L859 Epidermal thickening, unspecified: Secondary | ICD-10-CM | POA: Diagnosis not present

## 2016-11-24 DIAGNOSIS — L700 Acne vulgaris: Secondary | ICD-10-CM | POA: Diagnosis not present

## 2016-11-24 DIAGNOSIS — Z85828 Personal history of other malignant neoplasm of skin: Secondary | ICD-10-CM | POA: Diagnosis not present

## 2016-11-24 DIAGNOSIS — L821 Other seborrheic keratosis: Secondary | ICD-10-CM | POA: Diagnosis not present

## 2016-11-24 DIAGNOSIS — D229 Melanocytic nevi, unspecified: Secondary | ICD-10-CM | POA: Diagnosis not present

## 2016-11-24 DIAGNOSIS — L304 Erythema intertrigo: Secondary | ICD-10-CM | POA: Diagnosis not present

## 2016-11-24 DIAGNOSIS — L718 Other rosacea: Secondary | ICD-10-CM | POA: Diagnosis not present

## 2016-11-26 ENCOUNTER — Encounter: Payer: Self-pay | Admitting: Internal Medicine

## 2016-11-26 ENCOUNTER — Ambulatory Visit (INDEPENDENT_AMBULATORY_CARE_PROVIDER_SITE_OTHER): Payer: Medicare Other | Admitting: Internal Medicine

## 2016-11-26 ENCOUNTER — Ambulatory Visit (INDEPENDENT_AMBULATORY_CARE_PROVIDER_SITE_OTHER): Payer: Medicare Other

## 2016-11-26 VITALS — BP 148/76 | HR 71 | Temp 98.3°F | Resp 16 | Ht 62.5 in | Wt 182.4 lb

## 2016-11-26 VITALS — BP 148/76 | HR 71 | Temp 98.3°F | Resp 16 | Ht 62.25 in | Wt 182.4 lb

## 2016-11-26 DIAGNOSIS — E119 Type 2 diabetes mellitus without complications: Secondary | ICD-10-CM

## 2016-11-26 DIAGNOSIS — I2510 Atherosclerotic heart disease of native coronary artery without angina pectoris: Secondary | ICD-10-CM | POA: Diagnosis not present

## 2016-11-26 DIAGNOSIS — C50411 Malignant neoplasm of upper-outer quadrant of right female breast: Secondary | ICD-10-CM

## 2016-11-26 DIAGNOSIS — Z1389 Encounter for screening for other disorder: Secondary | ICD-10-CM | POA: Diagnosis not present

## 2016-11-26 DIAGNOSIS — Z1331 Encounter for screening for depression: Secondary | ICD-10-CM

## 2016-11-26 DIAGNOSIS — R5383 Other fatigue: Secondary | ICD-10-CM | POA: Diagnosis not present

## 2016-11-26 DIAGNOSIS — Z0000 Encounter for general adult medical examination without abnormal findings: Secondary | ICD-10-CM | POA: Diagnosis not present

## 2016-11-26 DIAGNOSIS — K7581 Nonalcoholic steatohepatitis (NASH): Secondary | ICD-10-CM | POA: Diagnosis not present

## 2016-11-26 DIAGNOSIS — Z171 Estrogen receptor negative status [ER-]: Secondary | ICD-10-CM | POA: Diagnosis not present

## 2016-11-26 DIAGNOSIS — E7800 Pure hypercholesterolemia, unspecified: Secondary | ICD-10-CM | POA: Diagnosis not present

## 2016-11-26 LAB — COMPREHENSIVE METABOLIC PANEL
ALT: 68 U/L — ABNORMAL HIGH (ref 0–35)
AST: 48 U/L — ABNORMAL HIGH (ref 0–37)
Albumin: 4 g/dL (ref 3.5–5.2)
Alkaline Phosphatase: 80 U/L (ref 39–117)
BUN: 13 mg/dL (ref 6–23)
CHLORIDE: 103 meq/L (ref 96–112)
CO2: 28 mEq/L (ref 19–32)
Calcium: 9.7 mg/dL (ref 8.4–10.5)
Creatinine, Ser: 0.81 mg/dL (ref 0.40–1.20)
GFR: 74.08 mL/min (ref >60.00)
GLUCOSE: 115 mg/dL — AB (ref 70–99)
POTASSIUM: 4.3 meq/L (ref 3.5–5.1)
Sodium: 140 mEq/L (ref 135–145)
Total Bilirubin: 0.4 mg/dL (ref 0.2–1.2)
Total Protein: 6.9 g/dL (ref 6.0–8.3)

## 2016-11-26 LAB — HEMOGLOBIN A1C: Hgb A1c MFr Bld: 7.1 % — ABNORMAL HIGH (ref 4.6–6.5)

## 2016-11-26 LAB — MICROALBUMIN / CREATININE URINE RATIO
Creatinine,U: 92.1 mg/dL
Microalb Creat Ratio: 0.8 mg/g (ref 0.0–30.0)

## 2016-11-26 LAB — LIPID PANEL
CHOL/HDL RATIO: 5
Cholesterol: 249 mg/dL — ABNORMAL HIGH (ref 0–200)
HDL: 53.8 mg/dL (ref >39.00)
LDL CALC: 164 mg/dL — AB (ref 0–99)
NONHDL: 194.73
Triglycerides: 155 mg/dL — ABNORMAL HIGH (ref 0.0–149.0)
VLDL: 31 mg/dL (ref 0.0–40.0)

## 2016-11-26 LAB — TSH: TSH: 2.66 u[IU]/mL (ref 0.35–4.50)

## 2016-11-26 NOTE — Patient Instructions (Addendum)
You are doing extremely well !  Keep an eye on your blood pressure,  Goal is < 130/80     Here are several low carb protein bars that make great snacks and will not raise your sugars .  All have 5 g sugar or less    and can be found at [facility] in the pharmacy section :   Power Crunch  KIND 5 g sugar variety  [employer]  Bars (loaded with fiber) try microwaving for 10 sec out of wrapper  Atkins  The [employer] vaccine  IS ADVISED for all interested adults over 50 to prevent shingles   You can get it in september

## 2016-11-26 NOTE — Assessment & Plan Note (Signed)
.   Continue lifestyle modifications with goal weight loss of 10% over the next 6 months using exercise and a low glycemic index diet. She has been vaccinated against hep a and b.   Lab Results  Component Value Date   ALT 68 (H) 11/26/2016   AST 48 (H) 11/26/2016   ALKPHOS 80 11/26/2016   BILITOT 0.4 11/26/2016

## 2016-11-26 NOTE — Progress Notes (Signed)
Subjective:   Traci Hall is a 71 y.o. female who presents for Medicare Annual (Subsequent) preventive examination.  Review of Systems:  No ROS.  Medicare Wellness Visit. Additional risk factors are reflected in the social history.  Cardiac Risk Factors include: advanced age (>55men, >58 women);hypertension;diabetes mellitus;obesity (BMI >30kg/m2)     Objective:     Vitals: BP (!) 148/76 (BP Location: Right Arm, Patient Position: Sitting, Cuff Size: Normal)   Pulse 71   Temp 98.3 F (36.8 C) (Oral)   Resp 16   Ht 5' 2.5" (1.588 m)   Wt 182 lb 6.4 oz (82.7 kg)   SpO2 97%   BMI 32.83 kg/m   Body mass index is 32.83 kg/m.   Tobacco History  Smoking Status  . Former Smoker  . Packs/day: 1.50  . Years: 45.00  . Types: Cigarettes  . Quit date: 06/15/2005  Smokeless Tobacco  . Never Used     Counseling given: Not Answered   Past Medical History:  Diagnosis Date  . Allergic rhinitis   . Asthma   . Barrett's esophagus   . Cancer ([place])   . COPD (chronic obstructive pulmonary disease) ([place])    by prior PFTs  . GERD (gastroesophageal reflux disease)   . Hyperlipidemia   . Hypertension   . Obesity   . OSA on CPAP   . Personal history of tobacco use, presenting hazards to health 01/08/2015  . Polyp of colon    Past Surgical History:  Procedure Laterality Date  . ABDOMINAL HYSTERECTOMY  1991   secondary to endometriosis, and polyps  . APPENDECTOMY  1977  . BREAST BIOPSY Right 02/24/2016   path pending/biopsy  . BREAST SURGERY  03/27/2016   Lymph node removal  . COLONOSCOPY    . ESOPHAGOGASTRODUODENOSCOPY    . ESOPHAGOGASTRODUODENOSCOPY (EGD) WITH PROPOFOL N/A 03/17/2016   Procedure: ESOPHAGOGASTRODUODENOSCOPY (EGD) WITH PROPOFOL;  Surgeon: [name], MD;  Location: [facility] ENDOSCOPY;  Service: Endoscopy;  Laterality: N/A;  . LIVER BIOPSY    . NASAL SINUS SURGERY  2008  . SKIN CANCER EXCISION  2015, 2017   Family History  Problem Relation Age of  Onset  . Heart disease Father        valvular cardiomyopathy,  mitral valve  . Hyperlipidemia Father   . COPD Mother   . Hypertension Mother   . Emphysema Mother   . Hyperlipidemia Brother   . Stroke Maternal Grandfather   . Hyperlipidemia Brother   . Hypertension Brother   . Hyperlipidemia Brother   . Hypertension Daughter   . Hyperlipidemia Daughter   . Hyperlipidemia Son   . Cancer Neg Hx    History  Sexual Activity  . Sexual activity: Yes  . Birth control/ protection: Post-menopausal    Outpatient Encounter Prescriptions as of 11/26/2016  Medication Sig  . aspirin 81 MG tablet Take 81 mg by mouth daily.  . bimatoprost (LUMIGAN) 0.03 % ophthalmic solution Place 1 drop into both eyes at bedtime.  Marland Kitchen BREO ELLIPTA 100-25 MCG/INH AEPB USE 1 INHALATION DAILY  . Cholecalciferol (VITAMIN D-3) 1000 UNITS CAPS Take 1 capsule by mouth daily.  Marland Kitchen desloratadine (CLARINEX) 5 MG tablet TAKE 1 TABLET DAILY  . furosemide (LASIX) 20 MG tablet take 1 tablet by mouth once daily if needed  . hydrocortisone (ANUSOL-HC) 25 MG suppository Place 1 suppository (25 mg total) rectally 2 (two) times daily.  . montelukast (SINGULAIR) 10 MG tablet TAKE 1 TABLET AT BEDTIME  . Multiple Vitamins-Minerals (  CENTRUM SILVER PO) Take by mouth. Take 1 am   . olopatadine (PATANOL) 0.1 % ophthalmic solution Place 1 drop into both eyes as needed.   . pantoprazole (PROTONIX) 40 MG tablet Take 1 tablet (40 mg total) by mouth 2 (two) times daily.  [name] Glycol-Propyl Glycol (SYSTANE OP) Apply to eye. 1-2 drops in each eye as needed.  . vitamin C (ASCORBIC ACID) 500 MG tablet Take 500 mg by mouth daily.  . vitamin E (VITAMIN E) 400 UNIT capsule Take 400 Units by mouth daily.  . [DISCONTINUED] ACCU-CHEK FASTCLIX LANCETS MISC Use once or twice daily to check blood sugars  . albuterol (PROVENTIL HFA;VENTOLIN HFA) 108 (90 Base) MCG/ACT inhaler Pt uses 2 puff qam  Only.   Give qty sufficient for 90 days  . losartan  (COZAAR) 100 MG tablet Take 1 tablet (100 mg total) by mouth daily.   No facility-administered encounter medications on file as of 11/26/2016.     Activities of Daily Living In your present state of health, do you have any difficulty performing the following activities: 11/26/2016 07/14/2016  Hearing? Tempie Donning  Vision? N N  Difficulty concentrating or making decisions? N N  Walking or climbing stairs? Y Y  Dressing or bathing? N N  Doing errands, shopping? N N  Preparing Food and eating ? N -  Using the Toilet? N -  In the past six months, have you accidently leaked urine? N -  Do you have problems with loss of bowel control? N -  Managing your Medications? N -  Managing your Finances? N -  Housekeeping or managing your Housekeeping? N -  Some recent data might be hidden    Patient Care Team: [name], MD as PCP - General (Internal Medicine) Bary Castilla, Forest Gleason, MD (General Surgery) [name], MD (Internal Medicine)    Assessment:    This is a routine wellness examination for [place]. The goal of the wellness visit is to assist the patient how to close the gaps in care and create a preventative care plan for the patient.   The roster of all physicians providing medical care to patient is listed in the Snapshot section of the chart.  Taking calcium VIT D as appropriate/Osteoporosis risk reviewed.    Safety issues reviewed; Smoke and carbon monoxide detectors in the home. No firearms in the home.  Wears seatbelts when driving or riding with others. Patient does wear sunscreen or protective clothing when in direct sunlight. No violence in the home.  Depression- PHQ 2 &9 complete.  No signs/symptoms or verbal communication regarding little pleasure in doing things, feeling down, depressed or hopeless. No changes in sleeping, energy, eating, concentrating.  No thoughts of self harm or harm towards others. Time spent on this topic is 9 minutes.  Patient is alert, normal  appearance, oriented to person/place/and time. Correctly identified the president of the [place], recall of 3/3 words, and performing simple calculations. Displays appropriate judgement and can read correct time from watch face.   No new identified risk were noted.  No failures at ADL's or IADL's.  A  BMI- discussed the importance of a healthy diet, water intake and the benefits of aerobic exercise. Educational material provided.   24 hour diet recall: Breakfast: Eggs, hash brown, bacon  Lunch: Hamburger sandwich Dinner: Sausage biscuits Snack:Cookies, tea Daily fluid intake: 1 cups of caffeine,  4 cups of water, other no caffeine  Dental- every 6 months.  Dr. Ronita Hipps.  Eye-  Visual acuity not assessed per patient preference since they have regular follow up with the ophthalmologist.  Wears corrective lenses.  Sleep patterns- Sleeps 9 hours at night.  Wakes feeling rested. CPAP in use.  Patient Concerns: None at this time. Follow up with PCP as needed.  Exercise Activities and Dietary recommendations Current Exercise Habits: The patient does not participate in regular exercise at present  Goals    . Increase physical activity          Stretch exercises. Chair exercises.   Yoga exercises.      . Increase water intake          Stay hydrated      Fall Risk Fall Risk  11/26/2016 11/27/2015 11/26/2014 05/30/2014 05/19/2013  Falls in the past year? No No No No No   Depression Screen PHQ 2/9 Scores 11/26/2016 11/26/2014 05/30/2014 05/19/2013  PHQ - 2 Score 0 0 0 0  PHQ- 9 Score 0 - - -     Cognitive Function MMSE - Mini Mental State Exam 11/26/2016  Orientation to time 5  Orientation to Place 5  Registration 3  Attention/ Calculation 5  Recall 3  Language- name 2 objects 2  Language- repeat 1  Language- follow 3 step command 3  Language- read & follow direction 1  Write a sentence 1  Copy design 1  Total score 30        Immunization History  Administered Date(s)  Administered  . Hep A / Hep B 05/19/2013, 06/20/2013, 11/17/2013  . Influenza Split 03/21/2014  . Influenza Whole 02/23/2011, 03/07/2012  . Influenza-Unspecified 03/09/2013, 02/28/2015, 02/14/2016  . Pneumococcal Conjugate-13 03/07/2009, 08/14/2013  . Pneumococcal Polysaccharide-23 03/07/2009, 08/08/2015  . Tdap 07/21/2012  . Zoster 06/16/2007   Screening Tests Health Maintenance  Topic Date Due  . OPHTHALMOLOGY EXAM  09/09/2016  . URINE MICROALBUMIN  11/26/2016  . INFLUENZA VACCINE  01/13/2017  . HEMOGLOBIN A1C  01/21/2017  . FOOT EXAM  05/28/2017  . MAMMOGRAM  01/01/2018  . COLONOSCOPY  03/07/2022  . TETANUS/TDAP  07/21/2022  . DEXA SCAN  Completed  . Hepatitis C Screening  Completed  . PNA vac Low Risk Adult  Completed      Plan:    End of life planning; Advance aging; Advanced directives discussed. Copy of current HCPOA/Living Will requested.    I have personally reviewed and noted the following in the patient's chart:   . Medical and social history . Use of alcohol, tobacco or illicit drugs  . Current medications and supplements . Functional ability and status . Nutritional status . Physical activity . Advanced directives . List of other physicians . Hospitalizations, surgeries, and ER visits in previous 12 months . Vitals . Screenings to include cognitive, depression, and falls . Referrals and appointments  In addition, I have reviewed and discussed with patient certain preventive protocols, quality metrics, and best practice recommendations. A written personalized care plan for preventive services as well as general preventive health recommendations were provided to patient.     OBrien-Blaney, Denisa L, LPN  4/49/6759   I have reviewed the above information and agree with above.   Deborra Medina, MD

## 2016-11-26 NOTE — Progress Notes (Signed)
Patient ID: Traci Hall, female    DOB: 1946-04-12  Age: 71 y.o. MRN: [id number]  The patient is here for follow up and  management of other chronic and acute problems.  Had AWV with denisa today  Diagnosed with BRCA Sept 2017 under treatment at [facility] . Finsihed taxol.  On herceptin  ECHO done March 2018 ECHO NORMAL  Sees aRida July Saw [facility],  PFTS    Not checking BS   BP elevated today but seeing Fletcher Anon next month  Improving  energy level ,  but no stamina for exercise Has developed Neuropathy soles of feet bothering her after 90 minutes.  Eye exam set up with [facility]     Lab Results  Component Value Date   MICROALBUR <0.7 11/26/2016      The risk factors are reflected in the social history.  The roster of all physicians providing medical care to patient - is listed in the Snapshot section of the chart.  Activities of daily living:  The patient is 100% independent in all ADLs: dressing, toileting, feeding as well as independent mobility  Home safety : The patient has smoke detectors in the home. They wear seatbelts.  There are no firearms at home. There is no violence in the home.   There is no risks for hepatitis, STDs or HIV. There is no   history of blood transfusion. They have no travel history to infectious disease endemic areas of the world.  The patient has seen their dentist in the last six month. They have seen their eye doctor in the last year. They admit to slight hearing difficulty with regard to whispered voices and some television programs.  They have deferred audiologic testing in the last year.  They do not  have excessive sun exposure. Discussed the need for sun protection: hats, long sleeves and use of sunscreen if there is significant sun exposure.   Diet: the importance of a healthy diet is discussed. They do have a healthy diet.  The benefits of regular aerobic exercise were discussed. She walks 4 times per week ,  20 minutes.   Depression screen:  there are no signs or vegative symptoms of depression- irritability, change in appetite, anhedonia, sadness/tearfullness.  Cognitive assessment: the patient manages all their financial and personal affairs and is actively engaged. They could relate day,date,year and events; recalled 2/3 objects at 3 minutes; performed clock-face test normally.  The following portions of the patient's history were reviewed and updated as appropriate: allergies, current medications, past family history, past medical history,  past surgical history, past social history  and problem list.  Visual acuity was not assessed per patient preference since she has regular follow up with her ophthalmologist. Hearing and body mass index were assessed and reviewed.   During the course of the visit the patient was educated and counseled about appropriate screening and preventive services including : fall prevention , diabetes screening, nutrition counseling, colorectal cancer screening, and recommended immunizations.    CC: The primary encounter diagnosis was Fatigue, unspecified type. Diagnoses of NASH (nonalcoholic steatohepatitis), Controlled type 2 diabetes mellitus without complication, unspecified whether long term insulin use ([place]), Malignant neoplasm of upper-outer quadrant of right breast in female, estrogen receptor negative ([place]), and Pure hypercholesterolemia were also pertinent to this visit.  History Ashly has a past medical history of Allergic rhinitis; Asthma; Barrett's esophagus; Cancer [facility]); COPD (chronic obstructive pulmonary disease) ([place]); GERD (gastroesophageal reflux disease); Hyperlipidemia; Hypertension; Obesity; OSA on CPAP; Personal history of  tobacco use, presenting hazards to health (01/08/2015); and Polyp of colon.   She has a past surgical history that includes Nasal sinus surgery (2008); Appendectomy (1977); Abdominal hysterectomy (1991); Skin cancer excision (2015, 2017); Colonoscopy; Liver biopsy;  Esophagogastroduodenoscopy; Esophagogastroduodenoscopy (egd) with propofol (N/A, 03/17/2016); Breast biopsy (Right, 02/24/2016); and Breast surgery (03/27/2016).   Her family history includes COPD in her mother; Emphysema in her mother; Heart disease in her father; Hyperlipidemia in her brother, brother, brother, daughter, father, and son; Hypertension in her brother, daughter, and mother; Stroke in her maternal grandfather.She reports that she quit smoking about 11 years ago. Her smoking use included Cigarettes. She has a 67.50 pack-year smoking history. She has never used smokeless tobacco. She reports that she drinks about 1.2 oz of alcohol per week . She reports that she does not use drugs.  Outpatient Medications Prior to Visit  Medication Sig Dispense Refill  . albuterol (PROVENTIL HFA;VENTOLIN HFA) 108 (90 Base) MCG/ACT inhaler Pt uses 2 puff qam  Only.   Give qty sufficient for 90 days 3 Inhaler 3  . aspirin 81 MG tablet Take 81 mg by mouth daily.    . bimatoprost (LUMIGAN) 0.03 % ophthalmic solution Place 1 drop into both eyes at bedtime.    Marland Kitchen BREO ELLIPTA 100-25 MCG/INH AEPB USE 1 INHALATION DAILY 180 each 2  . Cholecalciferol (VITAMIN D-3) 1000 UNITS CAPS Take 1 capsule by mouth daily.    Marland Kitchen desloratadine (CLARINEX) 5 MG tablet TAKE 1 TABLET DAILY 90 tablet 3  . furosemide (LASIX) 20 MG tablet take 1 tablet by mouth once daily if needed 30 tablet 3  . hydrocortisone (ANUSOL-HC) 25 MG suppository Place 1 suppository (25 mg total) rectally 2 (two) times daily. 12 suppository 0  . montelukast (SINGULAIR) 10 MG tablet TAKE 1 TABLET AT BEDTIME 90 tablet 2  . Multiple Vitamins-Minerals (CENTRUM SILVER PO) Take by mouth. Take 1 am     . olopatadine (PATANOL) 0.1 % ophthalmic solution Place 1 drop into both eyes as needed.     . pantoprazole (PROTONIX) 40 MG tablet Take 1 tablet (40 mg total) by mouth 2 (two) times daily. 180 tablet 3  . Polyethyl Glycol-Propyl Glycol (SYSTANE OP) Apply to eye.  1-2 drops in each eye as needed.    . vitamin C (ASCORBIC ACID) 500 MG tablet Take 500 mg by mouth daily.    . vitamin E (VITAMIN E) 400 UNIT capsule Take 400 Units by mouth daily.    Marland Kitchen losartan (COZAAR) 100 MG tablet Take 1 tablet (100 mg total) by mouth daily. 90 tablet 3   No facility-administered medications prior to visit.     Review of Systems   Patient denies headache, fevers, malaise, unintentional weight loss, skin rash, eye pain, sinus congestion and sinus pain, sore throat, dysphagia,  hemoptysis , cough, dyspnea, wheezing, chest pain, palpitations, orthopnea, edema, abdominal pain, nausea, melena, diarrhea, constipation, flank pain, dysuria, hematuria, urinary  Frequency, nocturia, numbness, tingling, seizures,  Focal weakness, Loss of consciousness,  Tremor, insomnia, depression, anxiety, and suicidal ideation.      Objective:  BP (!) 148/76 (BP Location: Left Arm, Patient Position: Sitting, Cuff Size: Large)   Pulse 71   Temp 98.3 F (36.8 C) (Oral)   Resp 16   Ht 5' 2.25" (1.581 m)   Wt 182 lb 6.4 oz (82.7 kg)   SpO2 97%   BMI 33.09 kg/m   Physical Exam   General appearance: alert, cooperative and appears stated age Head: Normocephalic,  without obvious abnormality, atraumatic Eyes: conjunctivae/corneas clear. PERRL, EOM's intact. Fundi benign. Ears: normal TM's and external ear canals both ears Nose: Nares normal. Septum midline. Mucosa normal. No drainage or sinus tenderness. Throat: lips, mucosa, and tongue normal; teeth and gums normal Neck: no adenopathy, no carotid bruit, no JVD, supple, symmetrical, trachea midline and thyroid not enlarged, symmetric, no tenderness/mass/nodules Lungs: clear to auscultation bilaterally Breasts: right lumpectomy scar , left portacath.  Otherwise normal appearance, no masses or tenderness Heart: regular rate and rhythm, S1, S2 normal, no murmur, click, rub or gallop Abdomen: soft, non-tender; bowel sounds normal; no masses,  no  organomegaly Extremities: extremities normal, atraumatic, no cyanosis or edema Pulses: 2+ and symmetric Skin: Skin color, texture, turgor normal. No rashes or lesions Neurologic: Alert and oriented X 3, normal strength and tone. Normal symmetric reflexes. Normal coordination and gait.   Foot exam:  Nails are well trimmed,  No callouses,  Sensation intact to microfilament    Assessment & Plan:   Problem List Items Addressed This Visit    NASH (nonalcoholic steatohepatitis)    . Continue lifestyle modifications with goal weight loss of 10% over the next 6 months using exercise and a low glycemic index diet. She has been vaccinated against hep a and b.   Lab Results  Component Value Date   ALT 68 (H) 11/26/2016   AST 48 (H) 11/26/2016   ALKPHOS 80 11/26/2016   BILITOT 0.4 11/26/2016         Relevant Orders   Comprehensive metabolic panel (Completed)   Malignant neoplasm of upper-outer quadrant of right female breast [facility])    S/p lumpectomy , chemo (taxotere)and radiation finished April 2018.  On herceptin      Hyperlipidemia    Lipids are elevated without Zetia.  Will recommend resuming medication, continue  asa    Lab Results  Component Value Date   CHOL 249 (H) 11/26/2016   HDL 53.80 11/26/2016   LDLCALC 164 (H) 11/26/2016   LDLDIRECT 169.0 11/27/2015   TRIG 155.0 (H) 11/26/2016   CHOLHDL 5 11/26/2016         DM II (diabetes mellitus, type II), controlled ([place])    Diet controlled since 2014 with normoglycemia until recent treatment with steroids for COPD exacerbation. A1c still excellent.  No meds needed.  Diet reviewed in detail today with patient.   Lab Results  Component Value Date   HGBA1C 7.1 (H) 11/26/2016   Lab Results  Component Value Date   MICROALBUR <0.7 11/26/2016         Relevant Orders   Hemoglobin A1c (Completed)   Lipid panel (Completed)   Microalbumin / creatinine urine ratio (Completed)    Other Visit Diagnoses    Fatigue, unspecified  type    -  Primary   Relevant Orders   TSH (Completed)      I am having Ms. [facility] maintain her aspirin, olopatadine, Multiple Vitamins-Minerals (CENTRUM SILVER PO), vitamin C, vitamin E, Polyethyl Glycol-Propyl Glycol (SYSTANE OP), Vitamin D-3, hydrocortisone, furosemide, albuterol, montelukast, BREO ELLIPTA, losartan, bimatoprost, pantoprazole, and desloratadine.  No orders of the defined types were placed in this encounter.   There are no discontinued medications.  Follow-up: Return in about 3 months (around 02/26/2017).   [name], MD

## 2016-11-26 NOTE — Patient Instructions (Addendum)
  Ms. Traci Hall , Thank you for taking time to come for your Medicare Wellness Visit. I appreciate your ongoing commitment to your health goals. Please review the following plan we discussed and let me know if I can assist you in the future.   Follow up with Dr. Derrel Nip as needed.    Bring a copy of your [place] and/or Living Will to be scanned into chart.  Emmi education assigned via portal: cholesterol, diabetes  Have a great day!  These are the goals we discussed: Goals    . Increase physical activity          Stretch exercises. Chair exercises.   Yoga exercises.      . Increase water intake          Stay hydrated       This is a list of the screening recommended for you and due dates:  Health Maintenance  Topic Date Due  . Eye exam for diabetics  09/09/2016  . Urine Protein Check  11/26/2016  . Flu Shot  01/13/2017  . Hemoglobin A1C  01/21/2017  . Complete foot exam   05/28/2017  . Mammogram  01/01/2018  . Colon Cancer Screening  03/07/2022  . Tetanus Vaccine  07/21/2022  . DEXA scan (bone density measurement)  Completed  .  Hepatitis C: One time screening is recommended by Center for Disease Control  (CDC) for  adults born from [birth year] through 1965.   Completed  . Pneumonia vaccines  Completed

## 2016-11-26 NOTE — Assessment & Plan Note (Signed)
Diet controlled since 2014 with normoglycemia until recent treatment with steroids for COPD exacerbation. A1c still excellent.  No meds needed.  Diet reviewed in detail today with patient.   Lab Results  Component Value Date   HGBA1C 7.1 (H) 11/26/2016   Lab Results  Component Value Date   MICROALBUR <0.7 11/26/2016

## 2016-11-26 NOTE — Assessment & Plan Note (Signed)
S/p lumpectomy , chemo (taxotere)and radiation finished April 2018.  On herceptin

## 2016-11-26 NOTE — Assessment & Plan Note (Signed)
Lipids are elevated without Zetia.  Will recommend resuming medication, continue  asa    Lab Results  Component Value Date   CHOL 249 (H) 11/26/2016   HDL 53.80 11/26/2016   LDLCALC 164 (H) 11/26/2016   LDLDIRECT 169.0 11/27/2015   TRIG 155.0 (H) 11/26/2016   CHOLHDL 5 11/26/2016

## 2016-11-27 ENCOUNTER — Encounter: Payer: Self-pay | Admitting: Internal Medicine

## 2016-11-30 ENCOUNTER — Other Ambulatory Visit: Payer: Self-pay | Admitting: Internal Medicine

## 2016-11-30 ENCOUNTER — Encounter: Payer: Self-pay | Admitting: Internal Medicine

## 2016-11-30 MED ORDER — ROSUVASTATIN CALCIUM 5 MG PO TABS: 5.0000 mg | ORAL_TABLET | Freq: Every day | ORAL | 2 refills | Status: DC

## 2016-12-06 ENCOUNTER — Other Ambulatory Visit: Payer: Self-pay | Admitting: Internal Medicine

## 2016-12-07 DIAGNOSIS — Z5112 Encounter for antineoplastic immunotherapy: Secondary | ICD-10-CM | POA: Diagnosis not present

## 2016-12-07 DIAGNOSIS — C50411 Malignant neoplasm of upper-outer quadrant of right female breast: Secondary | ICD-10-CM | POA: Diagnosis not present

## 2016-12-07 DIAGNOSIS — Z171 Estrogen receptor negative status [ER-]: Secondary | ICD-10-CM | POA: Diagnosis not present

## 2016-12-07 DIAGNOSIS — Z79899 Other long term (current) drug therapy: Secondary | ICD-10-CM | POA: Diagnosis not present

## 2016-12-09 DIAGNOSIS — H40053 Ocular hypertension, bilateral: Secondary | ICD-10-CM | POA: Diagnosis not present

## 2016-12-10 DIAGNOSIS — H401131 Primary open-angle glaucoma, bilateral, mild stage: Secondary | ICD-10-CM | POA: Diagnosis not present

## 2016-12-10 LAB — HM DIABETES EYE EXAM

## 2016-12-22 ENCOUNTER — Telehealth: Payer: Self-pay | Admitting: Pulmonary Disease

## 2016-12-22 NOTE — Telephone Encounter (Signed)
Contacted patient and scheduled PFT at [facility] for [place]. 02/25/17 at 9:30. Pt to arrive at 9:15 at the [facility].  Appointment and instructions mailed to patient.  Contacted patient and she is aware of appointment. Rhonda J Cobb

## 2016-12-22 NOTE — Telephone Encounter (Signed)
Patient has upcoming 9/24 appt  Needs pft prior to appt please call.  Not available on 9/17

## 2016-12-24 DIAGNOSIS — Z5111 Encounter for antineoplastic chemotherapy: Secondary | ICD-10-CM | POA: Diagnosis not present

## 2016-12-24 DIAGNOSIS — I10 Essential (primary) hypertension: Secondary | ICD-10-CM | POA: Diagnosis not present

## 2016-12-24 DIAGNOSIS — G629 Polyneuropathy, unspecified: Secondary | ICD-10-CM | POA: Diagnosis not present

## 2016-12-24 DIAGNOSIS — Z7982 Long term (current) use of aspirin: Secondary | ICD-10-CM | POA: Diagnosis not present

## 2016-12-24 DIAGNOSIS — Z79899 Other long term (current) drug therapy: Secondary | ICD-10-CM | POA: Diagnosis not present

## 2016-12-24 DIAGNOSIS — C50511 Malignant neoplasm of lower-outer quadrant of right female breast: Secondary | ICD-10-CM | POA: Diagnosis not present

## 2016-12-24 DIAGNOSIS — Z171 Estrogen receptor negative status [ER-]: Secondary | ICD-10-CM | POA: Diagnosis not present

## 2016-12-24 DIAGNOSIS — C50411 Malignant neoplasm of upper-outer quadrant of right female breast: Secondary | ICD-10-CM | POA: Diagnosis not present

## 2016-12-24 DIAGNOSIS — J441 Chronic obstructive pulmonary disease with (acute) exacerbation: Secondary | ICD-10-CM | POA: Diagnosis not present

## 2016-12-24 DIAGNOSIS — I2510 Atherosclerotic heart disease of native coronary artery without angina pectoris: Secondary | ICD-10-CM | POA: Diagnosis not present

## 2016-12-24 DIAGNOSIS — Z87891 Personal history of nicotine dependence: Secondary | ICD-10-CM | POA: Diagnosis not present

## 2016-12-24 DIAGNOSIS — K219 Gastro-esophageal reflux disease without esophagitis: Secondary | ICD-10-CM | POA: Diagnosis not present

## 2016-12-24 DIAGNOSIS — J45909 Unspecified asthma, uncomplicated: Secondary | ICD-10-CM | POA: Diagnosis not present

## 2016-12-24 DIAGNOSIS — Z885 Allergy status to narcotic agent status: Secondary | ICD-10-CM | POA: Diagnosis not present

## 2016-12-24 DIAGNOSIS — Z8249 Family history of ischemic heart disease and other diseases of the circulatory system: Secondary | ICD-10-CM | POA: Diagnosis not present

## 2016-12-28 DIAGNOSIS — Z923 Personal history of irradiation: Secondary | ICD-10-CM | POA: Diagnosis not present

## 2016-12-28 DIAGNOSIS — K219 Gastro-esophageal reflux disease without esophagitis: Secondary | ICD-10-CM | POA: Diagnosis not present

## 2016-12-28 DIAGNOSIS — Z87891 Personal history of nicotine dependence: Secondary | ICD-10-CM | POA: Diagnosis not present

## 2016-12-28 DIAGNOSIS — C50411 Malignant neoplasm of upper-outer quadrant of right female breast: Secondary | ICD-10-CM | POA: Diagnosis not present

## 2016-12-28 DIAGNOSIS — Z7951 Long term (current) use of inhaled steroids: Secondary | ICD-10-CM | POA: Diagnosis not present

## 2016-12-28 DIAGNOSIS — J449 Chronic obstructive pulmonary disease, unspecified: Secondary | ICD-10-CM | POA: Diagnosis not present

## 2016-12-28 DIAGNOSIS — I10 Essential (primary) hypertension: Secondary | ICD-10-CM | POA: Diagnosis not present

## 2016-12-28 DIAGNOSIS — Z7982 Long term (current) use of aspirin: Secondary | ICD-10-CM | POA: Diagnosis not present

## 2016-12-28 DIAGNOSIS — Z886 Allergy status to analgesic agent status: Secondary | ICD-10-CM | POA: Diagnosis not present

## 2016-12-28 DIAGNOSIS — T451X5A Adverse effect of antineoplastic and immunosuppressive drugs, initial encounter: Secondary | ICD-10-CM | POA: Diagnosis not present

## 2016-12-28 DIAGNOSIS — Z171 Estrogen receptor negative status [ER-]: Secondary | ICD-10-CM | POA: Diagnosis not present

## 2016-12-28 DIAGNOSIS — R278 Other lack of coordination: Secondary | ICD-10-CM | POA: Diagnosis not present

## 2016-12-28 DIAGNOSIS — M1990 Unspecified osteoarthritis, unspecified site: Secondary | ICD-10-CM | POA: Diagnosis not present

## 2016-12-28 DIAGNOSIS — T451X5S Adverse effect of antineoplastic and immunosuppressive drugs, sequela: Secondary | ICD-10-CM | POA: Diagnosis not present

## 2016-12-28 DIAGNOSIS — Z888 Allergy status to other drugs, medicaments and biological substances status: Secondary | ICD-10-CM | POA: Diagnosis not present

## 2016-12-28 DIAGNOSIS — G620 Drug-induced polyneuropathy: Secondary | ICD-10-CM | POA: Diagnosis not present

## 2016-12-28 DIAGNOSIS — Z79899 Other long term (current) drug therapy: Secondary | ICD-10-CM | POA: Diagnosis not present

## 2016-12-28 DIAGNOSIS — Z85828 Personal history of other malignant neoplasm of skin: Secondary | ICD-10-CM | POA: Diagnosis not present

## 2016-12-28 DIAGNOSIS — R062 Wheezing: Secondary | ICD-10-CM | POA: Diagnosis not present

## 2016-12-28 DIAGNOSIS — I427 Cardiomyopathy due to drug and external agent: Secondary | ICD-10-CM | POA: Diagnosis not present

## 2016-12-28 DIAGNOSIS — K2270 Barrett's esophagus without dysplasia: Secondary | ICD-10-CM | POA: Diagnosis not present

## 2016-12-28 DIAGNOSIS — Z9011 Acquired absence of right breast and nipple: Secondary | ICD-10-CM | POA: Diagnosis not present

## 2016-12-28 DIAGNOSIS — E7800 Pure hypercholesterolemia, unspecified: Secondary | ICD-10-CM | POA: Diagnosis not present

## 2016-12-28 DIAGNOSIS — Z885 Allergy status to narcotic agent status: Secondary | ICD-10-CM | POA: Diagnosis not present

## 2016-12-28 DIAGNOSIS — I2510 Atherosclerotic heart disease of native coronary artery without angina pectoris: Secondary | ICD-10-CM | POA: Diagnosis not present

## 2016-12-29 DIAGNOSIS — Z171 Estrogen receptor negative status [ER-]: Secondary | ICD-10-CM | POA: Diagnosis not present

## 2016-12-29 DIAGNOSIS — Z5112 Encounter for antineoplastic immunotherapy: Secondary | ICD-10-CM | POA: Diagnosis not present

## 2016-12-29 DIAGNOSIS — Z79899 Other long term (current) drug therapy: Secondary | ICD-10-CM | POA: Diagnosis not present

## 2016-12-29 DIAGNOSIS — C50411 Malignant neoplasm of upper-outer quadrant of right female breast: Secondary | ICD-10-CM | POA: Diagnosis not present

## 2017-01-08 ENCOUNTER — Ambulatory Visit: Payer: Medicare Other | Admitting: Cardiovascular Disease

## 2017-01-08 DIAGNOSIS — G4733 Obstructive sleep apnea (adult) (pediatric): Secondary | ICD-10-CM | POA: Diagnosis not present

## 2017-01-12 DIAGNOSIS — C50411 Malignant neoplasm of upper-outer quadrant of right female breast: Secondary | ICD-10-CM | POA: Diagnosis not present

## 2017-01-12 DIAGNOSIS — N641 Fat necrosis of breast: Secondary | ICD-10-CM | POA: Diagnosis not present

## 2017-01-12 DIAGNOSIS — R921 Mammographic calcification found on diagnostic imaging of breast: Secondary | ICD-10-CM | POA: Diagnosis not present

## 2017-01-12 DIAGNOSIS — Z171 Estrogen receptor negative status [ER-]: Secondary | ICD-10-CM | POA: Diagnosis not present

## 2017-01-18 ENCOUNTER — Telehealth: Payer: Self-pay | Admitting: *Deleted

## 2017-01-18 DIAGNOSIS — Z87891 Personal history of nicotine dependence: Secondary | ICD-10-CM

## 2017-01-18 DIAGNOSIS — Z5112 Encounter for antineoplastic immunotherapy: Secondary | ICD-10-CM | POA: Diagnosis not present

## 2017-01-18 DIAGNOSIS — Z79899 Other long term (current) drug therapy: Secondary | ICD-10-CM | POA: Diagnosis not present

## 2017-01-18 DIAGNOSIS — C50411 Malignant neoplasm of upper-outer quadrant of right female breast: Secondary | ICD-10-CM | POA: Diagnosis not present

## 2017-01-18 DIAGNOSIS — Z171 Estrogen receptor negative status [ER-]: Secondary | ICD-10-CM | POA: Diagnosis not present

## 2017-01-18 DIAGNOSIS — Z122 Encounter for screening for malignant neoplasm of respiratory organs: Secondary | ICD-10-CM

## 2017-01-18 NOTE — Telephone Encounter (Signed)
Notified patient that annual lung cancer screening low dose CT scan is due currently or will be in near future. Confirmed that patient is within the age range of 55-77, and asymptomatic, (no signs or symptoms of lung cancer). Patient denies illness that would prevent curative treatment for lung cancer if found. Verified smoking history, (former, quit 2007, 50 pack year). The shared decision making visit was done 12/22/13. Patient is agreeable for CT scan being scheduled.  

## 2017-01-19 ENCOUNTER — Telehealth: Payer: Self-pay | Admitting: *Deleted

## 2017-01-19 NOTE — Telephone Encounter (Signed)
CT scheduled for 01/29/17 @ 9 a.m at [place], as requested. Appointment reminder mailed.

## 2017-01-19 NOTE — Telephone Encounter (Signed)
Appointment scheduled and confirmed with patient. Also mailed appt reminder.

## 2017-01-19 NOTE — Telephone Encounter (Signed)
CT scheduled for 01/28/17 @ 3 p.m at [place], as requested. Appointment reminder mailed.

## 2017-01-29 ENCOUNTER — Ambulatory Visit
Admission: RE | Admit: 2017-01-29 | Discharge: 2017-01-29 | Disposition: A | Discharge status: Home / Self Care | Payer: Medicare Other | Source: Ambulatory Visit | Attending: Oncology | Admitting: Oncology

## 2017-01-29 DIAGNOSIS — Z87891 Personal history of nicotine dependence: Secondary | ICD-10-CM | POA: Insufficient documentation

## 2017-01-29 DIAGNOSIS — J439 Emphysema, unspecified: Secondary | ICD-10-CM | POA: Diagnosis not present

## 2017-01-29 DIAGNOSIS — I348 Other nonrheumatic mitral valve disorders: Secondary | ICD-10-CM | POA: Diagnosis not present

## 2017-01-29 DIAGNOSIS — Z122 Encounter for screening for malignant neoplasm of respiratory organs: Secondary | ICD-10-CM | POA: Insufficient documentation

## 2017-01-29 DIAGNOSIS — I2510 Atherosclerotic heart disease of native coronary artery without angina pectoris: Secondary | ICD-10-CM | POA: Insufficient documentation

## 2017-01-29 DIAGNOSIS — K760 Fatty (change of) liver, not elsewhere classified: Secondary | ICD-10-CM | POA: Diagnosis not present

## 2017-01-29 DIAGNOSIS — I700 Atherosclerosis of aorta: Secondary | ICD-10-CM | POA: Diagnosis not present

## 2017-02-02 ENCOUNTER — Telehealth: Payer: Self-pay | Admitting: *Deleted

## 2017-02-02 NOTE — Telephone Encounter (Signed)
Notified patient of LDCT lung cancer screening program results with recommendation for follow up imaging after stabilization of radiation changes. Also notified of incidental findings noted below and is encouraged to discuss further with PCP who will receive a copy of this note and/or the CT report. Patient verbalizes understanding.   IMPRESSION: 1. Lung-RADS 0S, incomplete. Today's examination is considered nondiagnostic for assessment of lung cancer secondary to evolving postradiation changes in the periphery of the right lung. Until these changes have stabilized, this patient is not considered a suitable candidate for lung cancer screening. At such time, future chest CTs should be performed as routine diagnostic studies for routine surveillance for the patient's history of breast cancer. 2. Aortic atherosclerosis, in addition to left main and 2 vessel coronary artery disease. Assessment for potential risk factor modification, dietary therapy or pharmacologic therapy may be warranted, if clinically indicated. 3. Mild centrilobular and paraseptal emphysema. 4. Postoperative changes in the right breast presumably from prior lumpectomy with postoperative seroma in the periphery of the right breast, as above. 5. Hepatic steatosis. 6. There are calcifications of the mitral valve. Echocardiographic correlation for evaluation of potential valvular dysfunction may be warranted if clinically indicated.

## 2017-02-08 DIAGNOSIS — I427 Cardiomyopathy due to drug and external agent: Secondary | ICD-10-CM | POA: Diagnosis not present

## 2017-02-08 DIAGNOSIS — Z5112 Encounter for antineoplastic immunotherapy: Secondary | ICD-10-CM | POA: Diagnosis not present

## 2017-02-08 DIAGNOSIS — T451X5A Adverse effect of antineoplastic and immunosuppressive drugs, initial encounter: Secondary | ICD-10-CM | POA: Diagnosis not present

## 2017-02-08 DIAGNOSIS — Z171 Estrogen receptor negative status [ER-]: Secondary | ICD-10-CM | POA: Diagnosis not present

## 2017-02-08 DIAGNOSIS — C50411 Malignant neoplasm of upper-outer quadrant of right female breast: Secondary | ICD-10-CM | POA: Diagnosis not present

## 2017-02-11 ENCOUNTER — Ambulatory Visit (INDEPENDENT_AMBULATORY_CARE_PROVIDER_SITE_OTHER): Payer: Medicare Other | Admitting: Cardiovascular Disease

## 2017-02-11 ENCOUNTER — Encounter: Payer: Self-pay | Admitting: Cardiovascular Disease

## 2017-02-11 VITALS — BP 138/88 | HR 88 | Ht 62.0 in | Wt 183.5 lb

## 2017-02-11 DIAGNOSIS — I2510 Atherosclerotic heart disease of native coronary artery without angina pectoris: Secondary | ICD-10-CM | POA: Diagnosis not present

## 2017-02-11 DIAGNOSIS — I10 Essential (primary) hypertension: Secondary | ICD-10-CM | POA: Diagnosis not present

## 2017-02-11 DIAGNOSIS — E7800 Pure hypercholesterolemia, unspecified: Secondary | ICD-10-CM

## 2017-02-11 MED ORDER — AMLODIPINE BESYLATE 2.5 MG PO TABS: 2.5000 mg | ORAL_TABLET | Freq: Every day | ORAL | 3 refills | Status: DC

## 2017-02-11 NOTE — Patient Instructions (Signed)
Medication Instructions:  Your physician has recommended you make the following change in your medication: START taking amlodipine 2.5mg once daily   Labwork: none  Testing/Procedures: none  Follow-Up: Your physician wants you to follow-up in: 6 months with Dr. Arida.  You will receive a reminder letter in the mail two months in advance. If you don't receive a letter, please call our office to schedule the follow-up appointment.   Any Other Special Instructions Will Be Listed Below (If Applicable).     If you need a refill on your cardiac medications before your next appointment, please call your pharmacy.   

## 2017-02-11 NOTE — Progress Notes (Signed)
Cardiology Office Note   Date:  02/11/2017   ID:  Traci Hall, Traci Hall 07/15/1945, MRN [id number]  PCP:  [name], MD  Cardiologist:   [name], MD   Chief Complaint  Patient presents with  . other    6 month follow up. Meds reviewed by the pt. verbally. Pt. c/o elevated blood pressure with headaches for about 3 weeks.       History of Present Illness: Traci Hall is a 71 y.o. female who presents for  a follow-up visit regarding  coronary atherosclerosis noted on CT scan of the chest.  She has known history of diet-controlled diabetes, previous tobacco use with mild COPD with an asthmatic component, essential hypertension, GERD, hyperlipidemia with intolerance to statins and obesity. She quit smoking in 2007. She has no family history of premature coronary artery disease.   Exercise nuclear stress test in September 2016 was low risk. She did have mild ischemic EKG changes with exercise but perfusion was normal and ejection fraction was greater than 65%. Lisinopril was switched to losartan due to cough.  She continues to receive treatment for breast cancer at [facility]. She gets serial echocardiograms to evaluate ejection fraction given that she is on Herceptin. She had an echocardiogram done this week which showed normal EF.  She is doing well overall with no chest pain or leg edema. She reports stable exertional dyspnea. Blood pressure has been running high frequently and on recent occasion her blood pressure was 170/90 before one of her chemotherapy infusion.   Past Medical History:  Diagnosis Date  . Allergic rhinitis   . Asthma   . Barrett's esophagus   . Cancer ([place])   . COPD (chronic obstructive pulmonary disease) ([place])    by prior PFTs  . GERD (gastroesophageal reflux disease)   . Hyperlipidemia   . Hypertension   . Obesity   . OSA on CPAP   . Personal history of tobacco use, presenting hazards to health 01/08/2015  . Polyp of colon     Past  Surgical History:  Procedure Laterality Date  . ABDOMINAL HYSTERECTOMY  1991   secondary to endometriosis, and polyps  . APPENDECTOMY  1977  . BREAST BIOPSY Right 02/24/2016   path pending/biopsy  . BREAST SURGERY  03/27/2016   Lymph node removal  . COLONOSCOPY    . ESOPHAGOGASTRODUODENOSCOPY    . ESOPHAGOGASTRODUODENOSCOPY (EGD) WITH PROPOFOL N/A 03/17/2016   Procedure: ESOPHAGOGASTRODUODENOSCOPY (EGD) WITH PROPOFOL;  Surgeon: [name], MD;  Location: [facility] ENDOSCOPY;  Service: Endoscopy;  Laterality: N/A;  . LIVER BIOPSY    . NASAL SINUS SURGERY  2008  . SKIN CANCER EXCISION  2015, 2017     Current Outpatient Prescriptions  Medication Sig Dispense Refill  . aspirin 81 MG tablet Take 81 mg by mouth daily.    . bimatoprost (LUMIGAN) 0.03 % ophthalmic solution Place 1 drop into both eyes at bedtime.    Marland Kitchen BREO ELLIPTA 100-25 MCG/INH AEPB USE 1 INHALATION DAILY 180 each 2  . Cholecalciferol (VITAMIN D-3) 1000 UNITS CAPS Take 1 capsule by mouth daily.    . Coenzyme Q10 100 MG capsule Take 200 mg by mouth.    . desloratadine (CLARINEX) 5 MG tablet TAKE 1 TABLET DAILY 90 tablet 3  . furosemide (LASIX) 20 MG tablet take 1 tablet by mouth once daily if needed 30 tablet 3  . hydrocortisone (ANUSOL-HC) 25 MG suppository Place 1 suppository (25 mg total) rectally 2 (two) times daily.  12 suppository 0  . losartan (COZAAR) 100 MG tablet Take 1 tablet (100 mg total) by mouth daily. 90 tablet 3  . montelukast (SINGULAIR) 10 MG tablet TAKE 1 TABLET AT BEDTIME 90 tablet 2  . Multiple Vitamins-Minerals (CENTRUM SILVER PO) Take by mouth. Take 1 am     . olopatadine (PATANOL) 0.1 % ophthalmic solution Place 1 drop into both eyes as needed.     . Omega-3 Fatty Acids (EQL OMEGA 3 FISH OIL) 1400 MG CAPS Take 1,400 mg by mouth daily.    . pantoprazole (PROTONIX) 40 MG tablet Take 1 tablet (40 mg total) by mouth 2 (two) times daily. 180 tablet 3  . Polyethyl Glycol-Propyl Glycol (SYSTANE OP) Apply  to eye. 1-2 drops in each eye as needed.    Marland Kitchen PROAIR HFA 108 (90 Base) MCG/ACT inhaler USE 2 INHALATIONS EVERY MORNING ONLY 25.5 g 3  . rosuvastatin (CRESTOR) 5 MG tablet Take 1 tablet (5 mg total) by mouth daily. 30 tablet 2  . vitamin C (ASCORBIC ACID) 500 MG tablet Take 500 mg by mouth daily.    . vitamin E (VITAMIN E) 400 UNIT capsule Take 400 Units by mouth daily.     No current facility-administered medications for this visit.     Allergies:   Alendronate sodium; Demerol; Naproxen; and Statins    Social History:  The patient  reports that she quit smoking about 11 years ago. Her smoking use included Cigarettes. She has a 67.50 pack-year smoking history. She has never used smokeless tobacco. She reports that she drinks about 1.2 oz of alcohol per week . She reports that she does not use drugs.   Family History:  The patient's family history includes COPD in her mother; Emphysema in her mother; Heart disease in her father; Hyperlipidemia in her brother, brother, brother, daughter, father, and son; Hypertension in her brother, daughter, and mother; Stroke in her maternal grandfather.    ROS:  Please see the history of present illness.   Otherwise, review of systems are positive for none.   All other systems are reviewed and negative.    PHYSICAL EXAM: VS:  BP 138/88 (BP Location: Left Arm, Patient Position: Sitting, Cuff Size: Normal)   Pulse 88   Ht 5\' 2"  (1.575 m)   Wt 183 lb 8 oz (83.2 kg)   BMI 33.56 kg/m  , BMI Body mass index is 33.56 kg/m. GEN: Well nourished, well developed, in no acute distress  HEENT: normal  Neck: no JVD, carotid bruits, or masses Cardiac: RRR; no rubs, or gallops,no edema . 1/6 systolic ejection murmur at the base of the heart Respiratory:  clear to auscultation bilaterally, normal work of breathing GI: soft, nontender, nondistended, + BS MS: no deformity or atrophy  Skin: warm and dry, no rash Neuro:  Strength and sensation are intact Psych:  euthymic mood, full affect   EKG:  EKG is ordered today. The ekg ordered today demonstrates  normal sinus rhythm with sinus arrhythmia.    Recent Labs: 07/24/2016: Hemoglobin 9.8; Platelets 524 11/26/2016: ALT 68; BUN 13; Creatinine, Ser 0.81; Potassium 4.3; Sodium 140; TSH 2.66    Lipid Panel    Component Value Date/Time   CHOL 249 (H) 11/26/2016 1054   CHOL 216 (H) 04/12/2015 0837   TRIG 155.0 (H) 11/26/2016 1054   HDL 53.80 11/26/2016 1054   HDL 51 04/12/2015 0837   CHOLHDL 5 11/26/2016 1054   VLDL 31.0 11/26/2016 1054   LDLCALC 164 (H) 11/26/2016 1054  LDLCALC 131 (H) 04/12/2015 0837   LDLDIRECT 169.0 11/27/2015 1020      Wt Readings from Last 3 Encounters:  02/11/17 183 lb 8 oz (83.2 kg)  01/29/17 181 lb (82.1 kg)  11/26/16 182 lb 6.4 oz (82.7 kg)        No flowsheet data found.    ASSESSMENT AND PLAN:  1.  Coronary atherosclerosis without angina: Previous stress test in September 2016 was unremarkable. Continue low-dose aspirin. She was started again on small dose Crestor and so far she is tolerating it.  2. Essential hypertension:  Blood pressure continues to be elevated in spite of maximal dose losartan. I elected to add amlodipine 2.5 mg once daily.  3. Hyperlipidemia: She resumed small dose rosuvastatin recently and so far she has been tolerating this. She reports that he had myalgia usually comes back after about 3-4 weeks of treatment. If she is not able to tolerate this, we can consider treatment with a PCSK 9 inhibitor.   Disposition:   FU with me in 6 months  Signed,  [name], MD  02/11/2017 2:41 PM    [place]

## 2017-02-12 ENCOUNTER — Other Ambulatory Visit: Payer: Self-pay | Admitting: *Deleted

## 2017-02-12 MED ORDER — AMLODIPINE BESYLATE 2.5 MG PO TABS: 2.5000 mg | ORAL_TABLET | Freq: Every day | ORAL | 3 refills | Status: DC

## 2017-02-16 ENCOUNTER — Other Ambulatory Visit: Payer: Self-pay | Admitting: *Deleted

## 2017-02-16 MED ORDER — AMLODIPINE BESYLATE 2.5 MG PO TABS
2.5000 mg | ORAL_TABLET | Freq: Every day | ORAL | 3 refills | Status: DC
Start: 2017-02-16 — End: 2017-06-02

## 2017-02-25 ENCOUNTER — Ambulatory Visit: Payer: Medicare Other | Attending: Pulmonary Disease

## 2017-02-25 DIAGNOSIS — J449 Chronic obstructive pulmonary disease, unspecified: Secondary | ICD-10-CM | POA: Diagnosis not present

## 2017-03-01 DIAGNOSIS — I110 Hypertensive heart disease with heart failure: Secondary | ICD-10-CM | POA: Diagnosis not present

## 2017-03-01 DIAGNOSIS — Z885 Allergy status to narcotic agent status: Secondary | ICD-10-CM | POA: Diagnosis not present

## 2017-03-01 DIAGNOSIS — F0390 Unspecified dementia without behavioral disturbance: Secondary | ICD-10-CM | POA: Diagnosis not present

## 2017-03-01 DIAGNOSIS — N644 Mastodynia: Secondary | ICD-10-CM | POA: Diagnosis not present

## 2017-03-01 DIAGNOSIS — J449 Chronic obstructive pulmonary disease, unspecified: Secondary | ICD-10-CM | POA: Diagnosis not present

## 2017-03-01 DIAGNOSIS — G620 Drug-induced polyneuropathy: Secondary | ICD-10-CM | POA: Diagnosis not present

## 2017-03-01 DIAGNOSIS — Z79899 Other long term (current) drug therapy: Secondary | ICD-10-CM | POA: Diagnosis not present

## 2017-03-01 DIAGNOSIS — C50512 Malignant neoplasm of lower-outer quadrant of left female breast: Secondary | ICD-10-CM | POA: Diagnosis not present

## 2017-03-01 DIAGNOSIS — Z171 Estrogen receptor negative status [ER-]: Secondary | ICD-10-CM | POA: Diagnosis not present

## 2017-03-01 DIAGNOSIS — Z7982 Long term (current) use of aspirin: Secondary | ICD-10-CM | POA: Diagnosis not present

## 2017-03-01 DIAGNOSIS — I509 Heart failure, unspecified: Secondary | ICD-10-CM | POA: Diagnosis not present

## 2017-03-01 DIAGNOSIS — Z9012 Acquired absence of left breast and nipple: Secondary | ICD-10-CM | POA: Diagnosis not present

## 2017-03-01 DIAGNOSIS — G4700 Insomnia, unspecified: Secondary | ICD-10-CM | POA: Diagnosis not present

## 2017-03-01 DIAGNOSIS — Z87891 Personal history of nicotine dependence: Secondary | ICD-10-CM | POA: Diagnosis not present

## 2017-03-01 DIAGNOSIS — Z9221 Personal history of antineoplastic chemotherapy: Secondary | ICD-10-CM | POA: Diagnosis not present

## 2017-03-01 DIAGNOSIS — E7800 Pure hypercholesterolemia, unspecified: Secondary | ICD-10-CM | POA: Diagnosis not present

## 2017-03-01 DIAGNOSIS — I427 Cardiomyopathy due to drug and external agent: Secondary | ICD-10-CM | POA: Diagnosis not present

## 2017-03-01 DIAGNOSIS — I2510 Atherosclerotic heart disease of native coronary artery without angina pectoris: Secondary | ICD-10-CM | POA: Diagnosis not present

## 2017-03-01 DIAGNOSIS — Z923 Personal history of irradiation: Secondary | ICD-10-CM | POA: Diagnosis not present

## 2017-03-01 DIAGNOSIS — T451X5D Adverse effect of antineoplastic and immunosuppressive drugs, subsequent encounter: Secondary | ICD-10-CM | POA: Diagnosis not present

## 2017-03-02 DIAGNOSIS — Z171 Estrogen receptor negative status [ER-]: Secondary | ICD-10-CM | POA: Diagnosis not present

## 2017-03-02 DIAGNOSIS — Z5112 Encounter for antineoplastic immunotherapy: Secondary | ICD-10-CM | POA: Diagnosis not present

## 2017-03-02 DIAGNOSIS — Z79899 Other long term (current) drug therapy: Secondary | ICD-10-CM | POA: Diagnosis not present

## 2017-03-02 DIAGNOSIS — C50411 Malignant neoplasm of upper-outer quadrant of right female breast: Secondary | ICD-10-CM | POA: Diagnosis not present

## 2017-03-03 ENCOUNTER — Encounter: Payer: Self-pay | Admitting: Internal Medicine

## 2017-03-03 ENCOUNTER — Ambulatory Visit (INDEPENDENT_AMBULATORY_CARE_PROVIDER_SITE_OTHER): Payer: Medicare Other | Admitting: Internal Medicine

## 2017-03-03 VITALS — BP 140/84 | HR 82 | Temp 98.7°F | Resp 16 | Ht 62.0 in | Wt 185.4 lb

## 2017-03-03 DIAGNOSIS — I10 Essential (primary) hypertension: Secondary | ICD-10-CM | POA: Diagnosis not present

## 2017-03-03 DIAGNOSIS — E119 Type 2 diabetes mellitus without complications: Secondary | ICD-10-CM

## 2017-03-03 DIAGNOSIS — J4489 Other specified chronic obstructive pulmonary disease: Secondary | ICD-10-CM

## 2017-03-03 DIAGNOSIS — K7581 Nonalcoholic steatohepatitis (NASH): Secondary | ICD-10-CM | POA: Diagnosis not present

## 2017-03-03 DIAGNOSIS — K760 Fatty (change of) liver, not elsewhere classified: Secondary | ICD-10-CM | POA: Diagnosis not present

## 2017-03-03 DIAGNOSIS — I2510 Atherosclerotic heart disease of native coronary artery without angina pectoris: Secondary | ICD-10-CM | POA: Diagnosis not present

## 2017-03-03 DIAGNOSIS — Z171 Estrogen receptor negative status [ER-]: Secondary | ICD-10-CM | POA: Diagnosis not present

## 2017-03-03 DIAGNOSIS — Z23 Encounter for immunization: Secondary | ICD-10-CM

## 2017-03-03 DIAGNOSIS — G4733 Obstructive sleep apnea (adult) (pediatric): Secondary | ICD-10-CM

## 2017-03-03 DIAGNOSIS — F411 Generalized anxiety disorder: Secondary | ICD-10-CM | POA: Diagnosis not present

## 2017-03-03 DIAGNOSIS — Z789 Other specified health status: Secondary | ICD-10-CM

## 2017-03-03 DIAGNOSIS — C50411 Malignant neoplasm of upper-outer quadrant of right female breast: Secondary | ICD-10-CM

## 2017-03-03 DIAGNOSIS — J449 Chronic obstructive pulmonary disease, unspecified: Secondary | ICD-10-CM

## 2017-03-03 LAB — POCT GLYCOSYLATED HEMOGLOBIN (HGB A1C): Hemoglobin A1C: 6.8

## 2017-03-03 MED ORDER — ALPRAZOLAM 0.25 MG PO TABS: 0.2500 mg | ORAL_TABLET | Freq: Every evening | ORAL | 0 refills | Status: DC | PRN

## 2017-03-03 MED ORDER — ROSUVASTATIN CALCIUM 5 MG PO TABS: 5.0000 mg | ORAL_TABLET | Freq: Every day | ORAL | 2 refills | Status: DC

## 2017-03-03 NOTE — Progress Notes (Signed)
Subjective:  Patient ID: Traci Hall, female    DOB: 02-05-1946  Age: 71 y.o. MRN: [id number]  CC: The primary encounter diagnosis was Controlled type 2 diabetes mellitus without complication, unspecified whether long term insulin use ([place]). Diagnoses of Encounter for immunization, NASH (nonalcoholic steatohepatitis), Nonalcoholic fatty liver disease without nonalcoholic steatohepatitis (NASH), OSA (obstructive sleep apnea), Malignant neoplasm of upper-outer quadrant of right breast in female, estrogen receptor negative ([place]), Essential hypertension, COPD, Statin intolerance, and Anxiety state were also pertinent to this visit.  HPI Traci Hall presents for 3 month follow up on diabetes and other issues     Breast Cancer:  She has completed her  XRT in April.  Still receiving trastuzumab er Hair has grown back curly  GAD:  Her Son was injured in a work related accident in the [employer]. She has ben worried about him as well as her husband's prognosis given his vascular dementia.   Patient has been having insomnia, but not daily. Used to take 1/2 [place] which was too strong.   T2DM: Patient is following a low glycemic index diet and taking all prescribed medications regularly without side effects.  Fasting sugars have been under less than 140 most of the time and post prandials have been under 160 except on rare occasions. Patient is exercising about 3 times per week and intentionally trying to lose weight .  Patient has had an eye exam in the last 12 months and checks feet regularly for signs of infection.  Patient does not walk barefoot outside.  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  Started during chemotherapy. Tolerable,  Does not want meds.  Having severe cramps in left and right feet at  night . Patient is up to date on all recommended vaccinations  Quit smoking in 2007. Has been getting Low dose CT which was nondiagnostic last year bc of radiation changes in  the right lung , but has been repeated recently and considered normal    a1c 6.7   DEXA T score -1.2 [facility] 2016   Fatty liver with biopsy years ago  Last [place]  Dec 2017    Outpatient Medications Prior to Visit  Medication Sig Dispense Refill  . amLODipine (NORVASC) 2.5 MG tablet Take 1 tablet (2.5 mg total) by mouth daily. 180 tablet 3  . aspirin 81 MG tablet Take 81 mg by mouth daily.    . bimatoprost (LUMIGAN) 0.03 % ophthalmic solution Place 1 drop into both eyes at bedtime.    Marland Kitchen BREO ELLIPTA 100-25 MCG/INH AEPB USE 1 INHALATION DAILY 180 each 2  . Cholecalciferol (VITAMIN D-3) 1000 UNITS CAPS Take 1 capsule by mouth daily.    . Coenzyme Q10 100 MG capsule Take 200 mg by mouth.    . desloratadine (CLARINEX) 5 MG tablet TAKE 1 TABLET DAILY 90 tablet 3  . furosemide (LASIX) 20 MG tablet take 1 tablet by mouth once daily if needed 30 tablet 3  . hydrocortisone (ANUSOL-HC) 25 MG suppository Place 1 suppository (25 mg total) rectally 2 (two) times daily. 12 suppository 0  . montelukast (SINGULAIR) 10 MG tablet TAKE 1 TABLET AT BEDTIME 90 tablet 2  . Multiple Vitamins-Minerals (CENTRUM SILVER PO) Take by mouth. Take 1 am     . olopatadine (PATANOL) 0.1 % ophthalmic solution Place 1 drop into both eyes as needed.     . Omega-3 Fatty Acids (EQL OMEGA 3 FISH OIL) 1400 MG CAPS Take 1,400 mg by  mouth daily.    . pantoprazole (PROTONIX) 40 MG tablet Take 1 tablet (40 mg total) by mouth 2 (two) times daily. 180 tablet 3  . Polyethyl Glycol-Propyl Glycol (SYSTANE OP) Apply to eye. 1-2 drops in each eye as needed.    Marland Kitchen PROAIR HFA 108 (90 Base) MCG/ACT inhaler USE 2 INHALATIONS EVERY MORNING ONLY 25.5 g 3  . vitamin C (ASCORBIC ACID) 500 MG tablet Take 500 mg by mouth daily.    . vitamin E (VITAMIN E) 400 UNIT capsule Take 400 Units by mouth daily.    . rosuvastatin (CRESTOR) 5 MG tablet Take 1 tablet (5 mg total) by mouth daily. 30 tablet 2  . losartan (COZAAR) 100 MG tablet Take 1 tablet (100 mg  total) by mouth daily. 90 tablet 3   No facility-administered medications prior to visit.     Review of Systems;  Patient denies headache, fevers, malaise, unintentional weight loss, skin rash, eye pain, sinus congestion and sinus pain, sore throat, dysphagia,  hemoptysis , cough, dyspnea, wheezing, chest pain, palpitations, orthopnea, edema, abdominal pain, nausea, melena, diarrhea, constipation, flank pain, dysuria, hematuria, urinary  Frequency, nocturia, numbness, tingling, seizures,  Focal weakness, Loss of consciousness,  Tremor, insomnia, depression, anxiety, and suicidal ideation.      Objective:  BP 140/84 (BP Location: Left Arm, Patient Position: Sitting, Cuff Size: Normal)   Pulse 82   Temp 98.7 F (37.1 C) (Oral)   Resp 16   Ht 5\' 2"  (1.575 m)   Wt 185 lb 6.4 oz (84.1 kg)   SpO2 98%   BMI 33.91 kg/m   BP Readings from Last 3 Encounters:  03/03/17 140/84  02/11/17 138/88  11/26/16 (!) 148/76    Wt Readings from Last 3 Encounters:  03/03/17 185 lb 6.4 oz (84.1 kg)  02/11/17 183 lb 8 oz (83.2 kg)  01/29/17 181 lb (82.1 kg)    General appearance: alert, cooperative and appears stated age Ears: normal TM's and external ear canals both ears Throat: lips, mucosa, and tongue normal; teeth and gums normal Neck: no adenopathy, no carotid bruit, supple, symmetrical, trachea midline and thyroid not enlarged, symmetric, no tenderness/mass/nodules Back: symmetric, no curvature. ROM normal. No CVA tenderness. Lungs: clear to auscultation bilaterally Heart: regular rate and rhythm, S1, S2 normal, no murmur, click, rub or gallop Abdomen: soft, non-tender; bowel sounds normal; no masses,  no organomegaly Pulses: 2+ and symmetric Skin: Skin color, texture, turgor normal. No rashes or lesions Lymph nodes: Cervical, supraclavicular, and axillary nodes normal.  Lab Results  Component Value Date   HGBA1C 6.8 03/03/2017   HGBA1C 7.1 (H) 11/26/2016   HGBA1C 6.8% 07/24/2016     Lab Results  Component Value Date   CREATININE 0.81 11/26/2016   CREATININE 0.87 07/15/2016   CREATININE 0.77 07/14/2016    Lab Results  Component Value Date   WBC 16.8 (H) 07/24/2016   HGB 9.8 (L) 07/24/2016   HCT 30.7 (L) 07/24/2016   PLT 524 (H) 07/24/2016   GLUCOSE 115 (H) 11/26/2016   CHOL 249 (H) 11/26/2016   TRIG 155.0 (H) 11/26/2016   HDL 53.80 11/26/2016   LDLDIRECT 169.0 11/27/2015   LDLCALC 164 (H) 11/26/2016   ALT 68 (H) 11/26/2016   AST 48 (H) 11/26/2016   NA 140 11/26/2016   K 4.3 11/26/2016   CL 103 11/26/2016   CREATININE 0.81 11/26/2016   BUN 13 11/26/2016   CO2 28 11/26/2016   TSH 2.66 11/26/2016   HGBA1C 6.8 03/03/2017  MICROALBUR <0.7 11/26/2016    Ct Chest Lung Ca Screen Low Dose W/o Cm  Result Date: 01/29/2017 CLINICAL DATA:  71 year old female former smoker (quit in 2007) with 50 pack-year history of smoking. Lung cancer screening examination. Additional history of right-sided breast cancer diagnosed in 2017 status post lumpectomy, chemotherapy and radiation therapy. EXAM: CT CHEST WITHOUT CONTRAST LOW-DOSE FOR LUNG CANCER SCREENING TECHNIQUE: Multidetector CT imaging of the chest was performed following the standard protocol without IV contrast. COMPARISON:  Multiple priors, most recently 01/22/2016. FINDINGS: Cardiovascular: Heart size is normal. There is no significant pericardial fluid, thickening or pericardial calcification. There is aortic atherosclerosis, as well as atherosclerosis of the great vessels of the mediastinum and the coronary arteries, including calcified atherosclerotic plaque in the left main, left anterior descending and right coronary arteries. Calcifications of the mitral annulus. Left-sided internal jugular single-lumen porta cath with tip terminating in the mid superior vena cava. Mediastinum/Nodes: No pathologically enlarged mediastinal or hilar lymph nodes. Please note that accurate exclusion of hilar adenopathy is limited  on noncontrast CT scans. Esophagus is unremarkable in appearance. No axillary lymphadenopathy. Lungs/Pleura: Compared to the prior examination there are new areas of septal thickening, architectural distortion and nodularity in the periphery of the right lung immediately deep to the right breast, which are most compatible with evolving postradiation changes. This prevents accurate assessment for underlying lung cancer. No acute consolidative airspace disease. No pleural effusions. Mild centrilobular and paraseptal emphysema, most apparent the lung apices. Mild bilateral apical pleuroparenchymal thickening, similar to the prior examination, most compatible with chronic post infectious or inflammatory scarring. Upper Abdomen: Diffuse low attenuation throughout the visualized hepatic parenchyma, compatible with a background of hepatic steatosis. Aortic atherosclerosis. Musculoskeletal: New surgical clips in the lateral aspect of the right breast where there is an incompletely visualized low-attenuation collection measuring at least 4.9 x 3.6 cm, presumably a postoperative seroma at site of prior lumpectomy. There are no aggressive appearing lytic or blastic lesions noted in the visualized portions of the skeleton. IMPRESSION: 1. Lung-RADS 0S, incomplete. Today's examination is considered nondiagnostic for assessment of lung cancer secondary to evolving postradiation changes in the periphery of the right lung. Until these changes have stabilized, this patient is not considered a suitable candidate for lung cancer screening. At such time, future chest CTs should be performed as routine diagnostic studies for routine surveillance for the patient's history of breast cancer. 2. Aortic atherosclerosis, in addition to left main and 2 vessel coronary artery disease. Assessment for potential risk factor modification, dietary therapy or pharmacologic therapy may be warranted, if clinically indicated. 3. Mild centrilobular and  paraseptal emphysema. 4. Postoperative changes in the right breast presumably from prior lumpectomy with postoperative seroma in the periphery of the right breast, as above. 5. Hepatic steatosis. 6. There are calcifications of the mitral valve. Echocardiographic correlation for evaluation of potential valvular dysfunction may be warranted if clinically indicated. Aortic Atherosclerosis (ICD10-I70.0) and Emphysema (ICD10-J43.9). Electronically Signed   By: Vinnie Langton M.D.   On: 01/29/2017 14:15    Assessment & Plan:   Problem List Items Addressed This Visit    Anxiety state    Triggered by caregiver responsibilities of husband with dementia, current battle with breast cancer,  And recent injury of son while serving in the armed forces.  .   She has deferred pharmacotherapy but is having insomna quite often. Prn alprazolam prescribed for insomnia. The risks and benefits of benzodiazepine use were discussed with patient today including excessive sedation leading  to respiratory depression,  impaired thinking/driving, and addiction.  Patient was advised to avoid concurrent use with alcohol, to use medication only as needed and not to share with others  .       Relevant Medications   ALPRAZolam (XANAX) 0.25 MG tablet   COPD    Secondary to tobacco abuse.  She  can walk a flight of stairs with mild dyspnea       DM II (diabetes mellitus, type II), controlled ([place]) - Primary    Diet controlled since 2014 with normoglycemia until recent treatment with steroids for COPD exacerbation. A1c still excellent.  No meds needed.  Diet reviewed in detail today with patient.   Lab Results  Component Value Date   HGBA1C 6.8 03/03/2017   Lab Results  Component Value Date   MICROALBUR <0.7 11/26/2016         Relevant Medications   rosuvastatin (CRESTOR) 5 MG tablet   Other Relevant Orders   POCT HgB A1C (Completed)   Essential hypertension    she reports compliance with medication regimen  but has  an elevated reading today in office.  He has been asked to check her BP at home and inrease amlodipine for persistent systolic elevation over 938  Renal function will be checked today      Relevant Medications   rosuvastatin (CRESTOR) 5 MG tablet   Malignant neoplasm of upper-outer quadrant of right female breast [facility])    S/p lumpectomy , chemo (taxotere)and radiation finished April 2018.  Receiving IV infusions of  herceptin      Relevant Medications   ALPRAZolam (XANAX) 0.25 MG tablet   RESOLVED: NASH (nonalcoholic steatohepatitis)   Relevant Orders   US Abdomen Limited RUQ   Nonalcoholic fatty liver disease without nonalcoholic steatohepatitis (NASH)    With persistent liver enzyme elevation . Continue lifestyle modifications with goal weight loss of 10% over the next 6 months using exercise and a low glycemic index diet. She has been vaccinated against hep a and b.  Annual ultrasound due in December   Lab Results  Component Value Date   ALT 68 (H) 11/26/2016   AST 48 (H) 11/26/2016   ALKPHOS 80 11/26/2016   BILITOT 0.4 11/26/2016         OSA (obstructive sleep apnea)    Diagnosed by sleep study. She is wearing her CPAP every night a minimum of 6 hours per night and notes improved daytime wakefulness and decreased fatigue          Statin intolerance    She is willing to try low dose crestor again.        Other Visit Diagnoses    Encounter for immunization       Relevant Orders   Flu vaccine HIGH DOSE PF (Completed)     A total of 25 minutes of face to face time was spent with patient more than half of which was spent in reviewing recent notes and imaging studies , counselling and coordination of care   I am having [facility]. [facility] start on ALPRAZolam. I am also having her maintain her aspirin, olopatadine, Multiple Vitamins-Minerals (CENTRUM SILVER PO), vitamin C, vitamin E, Polyethyl Glycol-Propyl Glycol (SYSTANE OP), Vitamin D-3, hydrocortisone, furosemide,  montelukast, BREO ELLIPTA, losartan, bimatoprost, pantoprazole, desloratadine, PROAIR HFA, Coenzyme Q10, EQL OMEGA 3 FISH OIL, amLODipine, and rosuvastatin.  Meds ordered this encounter  Medications  . rosuvastatin (CRESTOR) 5 MG tablet    Sig: Take 1 tablet (5 mg total) by mouth  daily.    Dispense:  30 tablet    Refill:  2  . ALPRAZolam (XANAX) 0.25 MG tablet    Sig: Take 1 tablet (0.25 mg total) by mouth at bedtime as needed for anxiety.    Dispense:  30 tablet    Refill:  0    Medications Discontinued During This Encounter  Medication Reason  . rosuvastatin (CRESTOR) 5 MG tablet Reorder    Follow-up: Return in about 3 months (around 06/02/2017) for follow up diabetes.   [name], MD

## 2017-03-03 NOTE — Patient Instructions (Addendum)
  Your a1c has improved and your diabetes is under excellent control.    your blood pressure has been  above the current goal  of 120/70, so I am recommending that you increase the amlodipine to 5 mg if bp is not consistently  < 130/80 in a week  (goal is 120/70) .  Check bp once daily at least an hour after you have taken your bp medications   You do not need the DEXA  Scan this year.     Liver ultrasound will be scheduled in December    I agree with postponing Shingrix until after the herceptin has been finished

## 2017-03-06 DIAGNOSIS — K760 Fatty (change of) liver, not elsewhere classified: Secondary | ICD-10-CM | POA: Insufficient documentation

## 2017-03-06 NOTE — Assessment & Plan Note (Signed)
she reports compliance with medication regimen  but has an elevated reading today in office.  He has been asked to check her BP at home and inrease amlodipine for persistent systolic elevation over 131  Renal function will be checked today

## 2017-03-06 NOTE — Assessment & Plan Note (Signed)
Atherosclerosis noted during imaging   EF 55-60% by August 2018 ECHO.  Statin intolerance. No historu of AMI

## 2017-03-06 NOTE — Assessment & Plan Note (Signed)
She is willing to try low dose crestor again.

## 2017-03-06 NOTE — Assessment & Plan Note (Signed)
Secondary to tobacco abuse.  She  can walk a flight of stairs with mild dyspnea

## 2017-03-06 NOTE — Assessment & Plan Note (Addendum)
Triggered by caregiver responsibilities of husband with dementia, current battle with breast cancer,  And recent injury of son while serving in the armed forces.  .   She has deferred pharmacotherapy but is having insomna quite often. Prn alprazolam prescribed for insomnia. The risks and benefits of benzodiazepine use were discussed with patient today including excessive sedation leading to respiratory depression,  impaired thinking/driving, and addiction.  Patient was advised to avoid concurrent use with alcohol, to use medication only as needed and not to share with others  .

## 2017-03-06 NOTE — Assessment & Plan Note (Signed)
Diet controlled since 2014 with normoglycemia until recent treatment with steroids for COPD exacerbation. A1c still excellent.  No meds needed.  Diet reviewed in detail today with patient.   Lab Results  Component Value Date   HGBA1C 6.8 03/03/2017   Lab Results  Component Value Date   MICROALBUR <0.7 11/26/2016

## 2017-03-06 NOTE — Assessment & Plan Note (Signed)
S/p lumpectomy , chemo (taxotere)and radiation finished April 2018.  Receiving IV infusions of  herceptin

## 2017-03-06 NOTE — Assessment & Plan Note (Signed)
Diagnosed by sleep study. She is wearing her CPAP every night a minimum of 6 hours per night and notes improved daytime wakefulness and decreased fatigue  

## 2017-03-06 NOTE — Assessment & Plan Note (Addendum)
With persistent liver enzyme elevation . Continue lifestyle modifications with goal weight loss of 10% over the next 6 months using exercise and a low glycemic index diet. She has been vaccinated against hep a and b.  Annual ultrasound due in December   Lab Results  Component Value Date   ALT 68 (H) 11/26/2016   AST 48 (H) 11/26/2016   ALKPHOS 80 11/26/2016   BILITOT 0.4 11/26/2016

## 2017-03-08 ENCOUNTER — Ambulatory Visit: Payer: Medicare Other | Admitting: Pulmonary Disease

## 2017-03-10 NOTE — Telephone Encounter (Signed)
See care note  

## 2017-03-16 ENCOUNTER — Ambulatory Visit: Payer: Medicare Other

## 2017-03-22 DIAGNOSIS — Z5112 Encounter for antineoplastic immunotherapy: Secondary | ICD-10-CM | POA: Diagnosis not present

## 2017-03-22 DIAGNOSIS — Z171 Estrogen receptor negative status [ER-]: Secondary | ICD-10-CM | POA: Diagnosis not present

## 2017-03-22 DIAGNOSIS — C50411 Malignant neoplasm of upper-outer quadrant of right female breast: Secondary | ICD-10-CM | POA: Diagnosis not present

## 2017-03-24 ENCOUNTER — Ambulatory Visit
Admission: RE | Admit: 2017-03-24 | Discharge: 2017-03-24 | Disposition: A | Discharge status: Home / Self Care | Payer: Medicare Other | Source: Ambulatory Visit | Attending: Internal Medicine | Admitting: Internal Medicine

## 2017-03-24 DIAGNOSIS — K7581 Nonalcoholic steatohepatitis (NASH): Secondary | ICD-10-CM | POA: Insufficient documentation

## 2017-03-24 DIAGNOSIS — K760 Fatty (change of) liver, not elsewhere classified: Secondary | ICD-10-CM | POA: Diagnosis not present

## 2017-03-25 ENCOUNTER — Encounter: Payer: Self-pay | Admitting: Internal Medicine

## 2017-03-26 ENCOUNTER — Encounter: Payer: Self-pay | Admitting: Pulmonary Disease

## 2017-03-26 ENCOUNTER — Ambulatory Visit (INDEPENDENT_AMBULATORY_CARE_PROVIDER_SITE_OTHER): Payer: Medicare Other | Admitting: Pulmonary Disease

## 2017-03-26 VITALS — BP 134/88 | HR 92 | Resp 16 | Ht 62.0 in | Wt 187.0 lb

## 2017-03-26 DIAGNOSIS — I2510 Atherosclerotic heart disease of native coronary artery without angina pectoris: Secondary | ICD-10-CM | POA: Diagnosis not present

## 2017-03-26 DIAGNOSIS — J449 Chronic obstructive pulmonary disease, unspecified: Secondary | ICD-10-CM

## 2017-03-26 NOTE — Progress Notes (Signed)
PROBLEMS: Mild COPD with asthmatic component Breast cancer - initially diagnosed 02/2016 OSA - managed by ENT  DATA: PFTs 12/31/13: normal LDCT 01/22/16: Lung-RADS Category 1, negative. Continue annual screening with low-dose chest CT without contrast in 12 months Echocardiogram 06/22/16: Limited study. Normal ejection fraction 60 to 65%. Degenerative mitral valve disease LDCT 01/29/17: evolving postradiation changes (after XRT for breast cancer) in the periphery of the right lung Echocardiogram 02/11/17: Limited study. Mild to moderate LVH. LVEF 55-60%. Mitral annular calcification. Mild RV dilatation with normal RV systolic function PFTs 99/37/16: Very mild obstruction, normal lung volumes, very mild decreased DLCO, DLCO/VA normal   INTERVAL HISTORY: No major events.   SUBJ: Routine reevaluation. She remains at her baseline with minimal DOE/SOB. Remains on Breo. No worsening of symptoms after discontinuation of montelukast. Continues to rarely use albuterol rescue inhaler. Denies CP, fever, purulent sputum, hemoptysis, LE edema and calf tenderness.  OBJ: Vitals:   03/26/17 1033 03/26/17 1038  BP:  134/88  Pulse:  92  Resp: 16   SpO2:  98%  Weight: 84.8 kg (187 lb)   Height: 5\' 2"  (1.575 m)   Room air  NAD  HEENT WNL No JVD BS full, no wheezes Regular, soft systolic M NABS, soft No C/C/E  Current Outpatient Prescriptions on File Prior to Visit  Medication Sig Dispense Refill  . ALPRAZolam (XANAX) 0.25 MG tablet Take 1 tablet (0.25 mg total) by mouth at bedtime as needed for anxiety. 30 tablet 0  . amLODipine (NORVASC) 2.5 MG tablet Take 1 tablet (2.5 mg total) by mouth daily. 180 tablet 3  . aspirin 81 MG tablet Take 81 mg by mouth daily.    . bimatoprost (LUMIGAN) 0.03 % ophthalmic solution Place 1 drop into both eyes at bedtime.    Marland Kitchen BREO ELLIPTA 100-25 MCG/INH AEPB USE 1 INHALATION DAILY 180 each 2  . Cholecalciferol (VITAMIN D-3) 1000 UNITS CAPS Take 1 capsule by  mouth daily.    Marland Kitchen desloratadine (CLARINEX) 5 MG tablet TAKE 1 TABLET DAILY 90 tablet 3  . furosemide (LASIX) 20 MG tablet take 1 tablet by mouth once daily if needed 30 tablet 3  . hydrocortisone (ANUSOL-HC) 25 MG suppository Place 1 suppository (25 mg total) rectally 2 (two) times daily. 12 suppository 0  . losartan (COZAAR) 100 MG tablet Take 1 tablet (100 mg total) by mouth daily. 90 tablet 3  . Multiple Vitamins-Minerals (CENTRUM SILVER PO) Take by mouth. Take 1 am     . olopatadine (PATANOL) 0.1 % ophthalmic solution Place 1 drop into both eyes as needed.     . Omega-3 Fatty Acids (EQL OMEGA 3 FISH OIL) 1400 MG CAPS Take 1,400 mg by mouth daily.    . pantoprazole (PROTONIX) 40 MG tablet Take 1 tablet (40 mg total) by mouth 2 (two) times daily. 180 tablet 3  . Polyethyl Glycol-Propyl Glycol (SYSTANE OP) Apply to eye. 1-2 drops in each eye as needed.    Marland Kitchen PROAIR HFA 108 (90 Base) MCG/ACT inhaler USE 2 INHALATIONS EVERY MORNING ONLY 25.5 g 3  . vitamin C (ASCORBIC ACID) 500 MG tablet Take 500 mg by mouth daily.    . vitamin E (VITAMIN E) 400 UNIT capsule Take 400 Units by mouth daily.     No current facility-administered medications on file prior to visit.      DATA: As above  IMPRESSION: Mild COPD @ baseline with asthmatic component - well maintained on Breo  PLAN: Continue Breo inhaler Continue albuterol as needed  Follow up as needed  Merton Border, MD PCCM service Mobile [phone] Pager [phone] 03/26/2017 11:10 AM

## 2017-03-26 NOTE — Patient Instructions (Signed)
Continue Breo inhaler Continue albuterol as needed  Follow-up as needed

## 2017-04-12 DIAGNOSIS — Z5112 Encounter for antineoplastic immunotherapy: Secondary | ICD-10-CM | POA: Diagnosis not present

## 2017-04-12 DIAGNOSIS — Z171 Estrogen receptor negative status [ER-]: Secondary | ICD-10-CM | POA: Diagnosis not present

## 2017-04-12 DIAGNOSIS — C50411 Malignant neoplasm of upper-outer quadrant of right female breast: Secondary | ICD-10-CM | POA: Diagnosis not present

## 2017-05-03 DIAGNOSIS — Z87891 Personal history of nicotine dependence: Secondary | ICD-10-CM | POA: Diagnosis not present

## 2017-05-03 DIAGNOSIS — Z923 Personal history of irradiation: Secondary | ICD-10-CM | POA: Diagnosis not present

## 2017-05-03 DIAGNOSIS — Z885 Allergy status to narcotic agent status: Secondary | ICD-10-CM | POA: Diagnosis not present

## 2017-05-03 DIAGNOSIS — N644 Mastodynia: Secondary | ICD-10-CM | POA: Diagnosis not present

## 2017-05-03 DIAGNOSIS — Z9221 Personal history of antineoplastic chemotherapy: Secondary | ICD-10-CM | POA: Diagnosis not present

## 2017-05-03 DIAGNOSIS — E7800 Pure hypercholesterolemia, unspecified: Secondary | ICD-10-CM | POA: Diagnosis not present

## 2017-05-03 DIAGNOSIS — Z7982 Long term (current) use of aspirin: Secondary | ICD-10-CM | POA: Diagnosis not present

## 2017-05-03 DIAGNOSIS — R0600 Dyspnea, unspecified: Secondary | ICD-10-CM | POA: Diagnosis not present

## 2017-05-03 DIAGNOSIS — Z888 Allergy status to other drugs, medicaments and biological substances status: Secondary | ICD-10-CM | POA: Diagnosis not present

## 2017-05-03 DIAGNOSIS — Z9011 Acquired absence of right breast and nipple: Secondary | ICD-10-CM | POA: Diagnosis not present

## 2017-05-03 DIAGNOSIS — G629 Polyneuropathy, unspecified: Secondary | ICD-10-CM | POA: Diagnosis not present

## 2017-05-03 DIAGNOSIS — G4700 Insomnia, unspecified: Secondary | ICD-10-CM | POA: Diagnosis not present

## 2017-05-03 DIAGNOSIS — M1990 Unspecified osteoarthritis, unspecified site: Secondary | ICD-10-CM | POA: Diagnosis not present

## 2017-05-03 DIAGNOSIS — K219 Gastro-esophageal reflux disease without esophagitis: Secondary | ICD-10-CM | POA: Diagnosis not present

## 2017-05-03 DIAGNOSIS — C50511 Malignant neoplasm of lower-outer quadrant of right female breast: Secondary | ICD-10-CM | POA: Diagnosis not present

## 2017-05-03 DIAGNOSIS — R5383 Other fatigue: Secondary | ICD-10-CM | POA: Diagnosis not present

## 2017-05-03 DIAGNOSIS — I2510 Atherosclerotic heart disease of native coronary artery without angina pectoris: Secondary | ICD-10-CM | POA: Diagnosis not present

## 2017-05-03 DIAGNOSIS — Z171 Estrogen receptor negative status [ER-]: Secondary | ICD-10-CM | POA: Diagnosis not present

## 2017-05-03 DIAGNOSIS — Z5112 Encounter for antineoplastic immunotherapy: Secondary | ICD-10-CM | POA: Diagnosis not present

## 2017-05-03 DIAGNOSIS — I10 Essential (primary) hypertension: Secondary | ICD-10-CM | POA: Diagnosis not present

## 2017-05-03 DIAGNOSIS — Z6835 Body mass index (BMI) 35.0-35.9, adult: Secondary | ICD-10-CM | POA: Diagnosis not present

## 2017-05-03 DIAGNOSIS — C50411 Malignant neoplasm of upper-outer quadrant of right female breast: Secondary | ICD-10-CM | POA: Diagnosis not present

## 2017-05-03 DIAGNOSIS — J449 Chronic obstructive pulmonary disease, unspecified: Secondary | ICD-10-CM | POA: Diagnosis not present

## 2017-05-28 DIAGNOSIS — H401131 Primary open-angle glaucoma, bilateral, mild stage: Secondary | ICD-10-CM | POA: Diagnosis not present

## 2017-06-02 ENCOUNTER — Encounter: Payer: Self-pay | Admitting: Internal Medicine

## 2017-06-02 ENCOUNTER — Ambulatory Visit (INDEPENDENT_AMBULATORY_CARE_PROVIDER_SITE_OTHER): Payer: Medicare Other | Admitting: Internal Medicine

## 2017-06-02 VITALS — BP 140/76 | HR 68 | Temp 98.1°F | Resp 15 | Ht 62.0 in | Wt 185.4 lb

## 2017-06-02 DIAGNOSIS — E119 Type 2 diabetes mellitus without complications: Secondary | ICD-10-CM

## 2017-06-02 DIAGNOSIS — I10 Essential (primary) hypertension: Secondary | ICD-10-CM

## 2017-06-02 DIAGNOSIS — Z789 Other specified health status: Secondary | ICD-10-CM

## 2017-06-02 DIAGNOSIS — G4733 Obstructive sleep apnea (adult) (pediatric): Secondary | ICD-10-CM | POA: Diagnosis not present

## 2017-06-02 DIAGNOSIS — I890 Lymphedema, not elsewhere classified: Secondary | ICD-10-CM | POA: Diagnosis not present

## 2017-06-02 DIAGNOSIS — Z79899 Other long term (current) drug therapy: Secondary | ICD-10-CM | POA: Diagnosis not present

## 2017-06-02 DIAGNOSIS — E7800 Pure hypercholesterolemia, unspecified: Secondary | ICD-10-CM | POA: Diagnosis not present

## 2017-06-02 DIAGNOSIS — E559 Vitamin D deficiency, unspecified: Secondary | ICD-10-CM

## 2017-06-02 DIAGNOSIS — I2510 Atherosclerotic heart disease of native coronary artery without angina pectoris: Secondary | ICD-10-CM | POA: Diagnosis not present

## 2017-06-02 LAB — LIPID PANEL
CHOLESTEROL: 231 mg/dL — AB (ref 0–200)
HDL: 52.8 mg/dL (ref >39.00)
LDL CALC: 144 mg/dL — AB (ref 0–99)
NonHDL: 177.76
TRIGLYCERIDES: 168 mg/dL — AB (ref 0.0–149.0)
Total CHOL/HDL Ratio: 4
VLDL: 33.6 mg/dL (ref 0.0–40.0)

## 2017-06-02 LAB — COMPREHENSIVE METABOLIC PANEL
ALK PHOS: 83 U/L (ref 39–117)
ALT: 80 U/L — ABNORMAL HIGH (ref 0–35)
AST: 55 U/L — ABNORMAL HIGH (ref 0–37)
Albumin: 4 g/dL (ref 3.5–5.2)
BUN: 15 mg/dL (ref 6–23)
CHLORIDE: 103 meq/L (ref 96–112)
CO2: 29 meq/L (ref 19–32)
Calcium: 9.3 mg/dL (ref 8.4–10.5)
Creatinine, Ser: 0.88 mg/dL (ref 0.40–1.20)
GFR: 67.23 mL/min (ref >60.00)
GLUCOSE: 136 mg/dL — AB (ref 70–99)
POTASSIUM: 4.3 meq/L (ref 3.5–5.1)
SODIUM: 139 meq/L (ref 135–145)
Total Bilirubin: 0.6 mg/dL (ref 0.2–1.2)
Total Protein: 7 g/dL (ref 6.0–8.3)

## 2017-06-02 LAB — VITAMIN D 25 HYDROXY (VIT D DEFICIENCY, FRACTURES): VITD: 41.5 ng/mL (ref 30.00–100.00)

## 2017-06-02 LAB — HEMOGLOBIN A1C: HEMOGLOBIN A1C: 7.1 % — AB (ref 4.6–6.5)

## 2017-06-02 LAB — VITAMIN B12: Vitamin B-12: 525 pg/mL (ref 211–911)

## 2017-06-02 MED ORDER — AMLODIPINE BESYLATE 5 MG PO TABS: 5.0000 mg | ORAL_TABLET | Freq: Every day | ORAL | 3 refills | Status: DC

## 2017-06-02 NOTE — Patient Instructions (Addendum)
I want you to lose 15 lbs over the next 6 months. This will help your blood pressure as well.   Increase your frequency  Of exercise (just  Walking will do ) to 3 to 5 times per week, 30 minutes preferably   For your blood pressure:   increase your amlodipine to 5 m g daily . Goal BP is 120/70 if tolerated..  Continue losartan 100 mg     Referral to Physical Therapy/lymphedema clinic  To help manage the swelling in your arm   If your A1c is <7.0,  I'll see you in 6 months as well   May God bless you with a joyous Christmas season, and with health and prosperity in the [place]!

## 2017-06-02 NOTE — Assessment & Plan Note (Signed)
Increasing amlodipine to 5 mg today.  Continue losartan 100 mg daily

## 2017-06-02 NOTE — Assessment & Plan Note (Signed)
She is statin intolerant.  Will discuss trial of Repatha given known CAD   Lab Results  Component Value Date   CHOL 231 (H) 06/02/2017   HDL 52.80 06/02/2017   LDLCALC 144 (H) 06/02/2017   LDLDIRECT 169.0 11/27/2015   TRIG 168.0 (H) 06/02/2017   CHOLHDL 4 06/02/2017

## 2017-06-02 NOTE — Progress Notes (Signed)
Subjective:  Patient ID: Traci Hall, female    DOB: 04-26-1946  Age: 70 y.o. MRN: [id number]  CC: The primary encounter diagnosis was Pure hypercholesterolemia. Diagnoses of OSA (obstructive sleep apnea), Controlled type 2 diabetes mellitus without complication, unspecified whether long term insulin use (Ona), Long-term use of high-risk medication, Vitamin D deficiency, Lymphedema of right upper extremity, Statin intolerance, and Essential hypertension were also pertinent to this visit.  HPI [facility] presents for 3 month follow up on diabetes.  Patient has no complaints today.  Patient is following a low glycemic index diet and taking all prescribed medications regularly without side effects.  Fasting sugars have not been checked . Patient is not exercising due to neuropathy,  treatment for breast cancer, and increased responsibilities caring for husband with vascular dementia.  Not intentionally trying to lose weight .  Patient has had an eye exam in June  And checks feet regularly for signs of infection.  Patient does not walk barefoot outside,  And has peripheral neuropathy described not as pain but as altered sensation . Patient is up to date on all recommended vaccinations   HTN:  Taking 100 mg losartan qhs,   Amlodipine 2.5 mg  In the am.  Home readings are elevated in the am.  Neuropathy  preventing her from exercising more. No pain , just feels like her toes are swollen.    Some lymphedema right axillary dependent edema.   Needs referral to lymphedema clinic .     Outpatient Medications Prior to Visit  Medication Sig Dispense Refill  . ALPRAZolam (XANAX) 0.25 MG tablet Take 1 tablet (0.25 mg total) by mouth at bedtime as needed for anxiety. 30 tablet 0  . aspirin 81 MG tablet Take 81 mg by mouth daily.    . bimatoprost (LUMIGAN) 0.03 % ophthalmic solution Place 1 drop into both eyes at bedtime.    Marland Kitchen BREO ELLIPTA 100-25 MCG/INH AEPB USE 1 INHALATION DAILY 180 each 2  .  Cholecalciferol (VITAMIN D-3) 1000 UNITS CAPS Take 1 capsule by mouth daily.    Marland Kitchen desloratadine (CLARINEX) 5 MG tablet TAKE 1 TABLET DAILY 90 tablet 3  . furosemide (LASIX) 20 MG tablet take 1 tablet by mouth once daily if needed 30 tablet 3  . hydrocortisone (ANUSOL-HC) 25 MG suppository Place 1 suppository (25 mg total) rectally 2 (two) times daily. 12 suppository 0  . Multiple Vitamins-Minerals (CENTRUM SILVER PO) Take by mouth. Take 1 am     . olopatadine (PATANOL) 0.1 % ophthalmic solution Place 1 drop into both eyes as needed.     . Omega-3 Fatty Acids (EQL OMEGA 3 FISH OIL) 1400 MG CAPS Take 1,400 mg by mouth daily.    . pantoprazole (PROTONIX) 40 MG tablet Take 1 tablet (40 mg total) by mouth 2 (two) times daily. 180 tablet 3  . Polyethyl Glycol-Propyl Glycol (SYSTANE OP) Apply to eye. 1-2 drops in each eye as needed.    Marland Kitchen PROAIR HFA 108 (90 Base) MCG/ACT inhaler USE 2 INHALATIONS EVERY MORNING ONLY 25.5 g 3  . vitamin C (ASCORBIC ACID) 500 MG tablet Take 500 mg by mouth daily.    . vitamin E (VITAMIN E) 400 UNIT capsule Take 400 Units by mouth daily.    Marland Kitchen losartan (COZAAR) 100 MG tablet Take 1 tablet (100 mg total) by mouth daily. 90 tablet 3  . amLODipine (NORVASC) 2.5 MG tablet Take 1 tablet (2.5 mg total) by mouth daily. 180 tablet 3  No facility-administered medications prior to visit.     Review of Systems;  Patient denies headache, fevers, malaise, unintentional weight loss, skin rash, eye pain, sinus congestion and sinus pain, sore throat, dysphagia,  hemoptysis , cough, dyspnea, wheezing, chest pain, palpitations, orthopnea, edema, abdominal pain, nausea, melena, diarrhea, constipation, flank pain, dysuria, hematuria, urinary  Frequency, nocturia, numbness, tingling, seizures,  Focal weakness, Loss of consciousness,  Tremor, insomnia, depression, anxiety, and suicidal ideation.      Objective:  BP 140/76 (BP Location: Left Arm, Patient Position: Sitting, Cuff Size: Normal)    Pulse 68   Temp 98.1 F (36.7 C) (Oral)   Resp 15   Ht 5\' 2"  (1.575 m)   Wt 185 lb 6.4 oz (84.1 kg)   SpO2 98%   BMI 33.91 kg/m   BP Readings from Last 3 Encounters:  06/02/17 140/76  03/26/17 134/88  03/03/17 140/84    Wt Readings from Last 3 Encounters:  06/02/17 185 lb 6.4 oz (84.1 kg)  03/26/17 187 lb (84.8 kg)  03/03/17 185 lb 6.4 oz (84.1 kg)    General appearance: alert, cooperative and appears stated age Ears: normal TM's and external ear canals both ears Throat: lips, mucosa, and tongue normal; teeth and gums normal Neck: no adenopathy, no carotid bruit, supple, symmetrical, trachea midline and thyroid not enlarged, symmetric, no tenderness/mass/nodules Back: symmetric, no curvature. ROM normal. No CVA tenderness. Lungs: clear to auscultation bilaterally Heart: regular rate and rhythm, S1, S2 normal, no murmur, click, rub or gallop Abdomen: soft, non-tender; bowel sounds normal; no masses,  no organomegaly Pulses: 2+ and symmetric Skin: Skin color, texture, turgor normal. No rashes or lesions Lymph nodes: Cervical, supraclavicular, and axillary nodes normal.  Lab Results  Component Value Date   HGBA1C 7.1 (H) 06/02/2017   HGBA1C 6.8 03/03/2017   HGBA1C 7.1 (H) 11/26/2016    Lab Results  Component Value Date   CREATININE 0.88 06/02/2017   CREATININE 0.81 11/26/2016   CREATININE 0.87 07/15/2016    Lab Results  Component Value Date   WBC 16.8 (H) 07/24/2016   HGB 9.8 (L) 07/24/2016   HCT 30.7 (L) 07/24/2016   PLT 524 (H) 07/24/2016   GLUCOSE 136 (H) 06/02/2017   CHOL 231 (H) 06/02/2017   TRIG 168.0 (H) 06/02/2017   HDL 52.80 06/02/2017   LDLDIRECT 169.0 11/27/2015   LDLCALC 144 (H) 06/02/2017   ALT 80 (H) 06/02/2017   AST 55 (H) 06/02/2017   NA 139 06/02/2017   K 4.3 06/02/2017   CL 103 06/02/2017   CREATININE 0.88 06/02/2017   BUN 15 06/02/2017   CO2 29 06/02/2017   TSH 2.66 11/26/2016   HGBA1C 7.1 (H) 06/02/2017   MICROALBUR <0.7  11/26/2016    US Abdomen Limited Ruq  Result Date: 03/24/2017 CLINICAL DATA:  Fatty liver EXAM: ULTRASOUND ABDOMEN LIMITED RIGHT UPPER QUADRANT COMPARISON:  06/05/2016 FINDINGS: Gallbladder: No gallstones or gallbladder wall thickening. No pericholecystic fluid. The sonographer reports no sonographic Murphy's sign. Common bile duct: Diameter: Upper normal for age at 7 mm diameter. Liver: Echogenic liver parenchyma with poor acoustic through transmission, features compatible with diffuse fatty deposition. Portal vein is patent on color Doppler imaging with normal direction of blood flow towards the liver. IMPRESSION: 1. Stable appearance of the echogenic liver. Imaging features are compatible with hepatic steatosis or diffuse hepatocellular disease. Electronically Signed   By: Misty Stanley M.D.   On: 03/24/2017 15:47    Assessment & Plan:   Problem List Items Addressed  This Visit    OSA (obstructive sleep apnea)   DM II (diabetes mellitus, type II), controlled ([place])    Diet controlled since 2014 with normoglycemia until recent treatment with steroids for COPD exacerbation. A1c still excellent.  No meds needed.  Diet reviewed in detail today with patient.   Lab Results  Component Value Date   HGBA1C 7.1 (H) 06/02/2017   Lab Results  Component Value Date   MICROALBUR <0.7 11/26/2016         Relevant Orders   Hemoglobin A1c (Completed)   Comprehensive metabolic panel (Completed)   Essential hypertension    Increasing amlodipine to 5 mg today.  Continue losartan 100 mg daily       Relevant Medications   amLODipine (NORVASC) 5 MG tablet   Hyperlipidemia - Primary    She is statin intolerant.  Will discuss trial of Repatha given known CAD   Lab Results  Component Value Date   CHOL 231 (H) 06/02/2017   HDL 52.80 06/02/2017   LDLCALC 144 (H) 06/02/2017   LDLDIRECT 169.0 11/27/2015   TRIG 168.0 (H) 06/02/2017   CHOLHDL 4 06/02/2017         Relevant Medications   amLODipine  (NORVASC) 5 MG tablet   Other Relevant Orders   Lipid panel (Completed)   Lymphedema of right upper extremity    Involving the axilla in a dependent fashion secondary to two surgeries on the right breast .  Referral to Pt for management      Statin intolerance    Patient would be a good candidate for Repatha given known CAD and documented statin intolerance. Will discuss with patient        Other Visit Diagnoses    Long-term use of high-risk medication       Relevant Orders   Vitamin B12 (Completed)   Vitamin D deficiency       Relevant Orders   VITAMIN D 25 Hydroxy (Vit-D Deficiency, Fractures) (Completed)    A total of 25 minutes of face to face time was spent with patient more than half of which was spent in counselling about the above mentioned conditions  and coordination of care   I have changed Inita [place]'s amLODipine. I am also having her maintain her aspirin, olopatadine, Multiple Vitamins-Minerals (CENTRUM SILVER PO), vitamin C, vitamin E, Polyethyl Glycol-Propyl Glycol (SYSTANE OP), Vitamin D-3, hydrocortisone, furosemide, BREO ELLIPTA, losartan, bimatoprost, pantoprazole, desloratadine, PROAIR HFA, EQL OMEGA 3 FISH OIL, and ALPRAZolam.  Meds ordered this encounter  Medications  . amLODipine (NORVASC) 5 MG tablet    Sig: Take 1 tablet (5 mg total) by mouth daily.    Dispense:  90 tablet    Refill:  3    Medications Discontinued During This Encounter  Medication Reason  . amLODipine (NORVASC) 2.5 MG tablet     Follow-up: No Follow-up on file.   [name], MD

## 2017-06-02 NOTE — Progress Notes (Signed)
Lab Results  Component Value Date   HGBA1C 6.8 03/03/2017   Lab Results  Component Value Date   MICROALBUR <0.7 11/26/2016   Lab Results  Component Value Date   TSH 2.66 11/26/2016   Lab Results  Component Value Date   CHOL 249 (H) 11/26/2016   HDL 53.80 11/26/2016   LDLCALC 164 (H) 11/26/2016   LDLDIRECT 169.0 11/27/2015   TRIG 155.0 (H) 11/26/2016   CHOLHDL 5 11/26/2016

## 2017-06-02 NOTE — Assessment & Plan Note (Signed)
Diet controlled since 2014 with normoglycemia until recent treatment with steroids for COPD exacerbation. A1c still excellent.  No meds needed.  Diet reviewed in detail today with patient.   Lab Results  Component Value Date   HGBA1C 7.1 (H) 06/02/2017   Lab Results  Component Value Date   MICROALBUR <0.7 11/26/2016

## 2017-06-02 NOTE — Assessment & Plan Note (Signed)
Involving the axilla in a dependent fashion secondary to two surgeries on the right breast .  Referral to Pt for management

## 2017-06-02 NOTE — Assessment & Plan Note (Signed)
Patient would be a good candidate for Repatha given known CAD and documented statin intolerance. Will discuss with patient  

## 2017-06-18 DIAGNOSIS — E7800 Pure hypercholesterolemia, unspecified: Secondary | ICD-10-CM | POA: Diagnosis not present

## 2017-06-18 DIAGNOSIS — Z886 Allergy status to analgesic agent status: Secondary | ICD-10-CM | POA: Diagnosis not present

## 2017-06-18 DIAGNOSIS — G4730 Sleep apnea, unspecified: Secondary | ICD-10-CM | POA: Diagnosis not present

## 2017-06-18 DIAGNOSIS — Z888 Allergy status to other drugs, medicaments and biological substances status: Secondary | ICD-10-CM | POA: Diagnosis not present

## 2017-06-18 DIAGNOSIS — Z885 Allergy status to narcotic agent status: Secondary | ICD-10-CM | POA: Diagnosis not present

## 2017-06-18 DIAGNOSIS — C50419 Malignant neoplasm of upper-outer quadrant of unspecified female breast: Secondary | ICD-10-CM | POA: Diagnosis not present

## 2017-06-18 DIAGNOSIS — J449 Chronic obstructive pulmonary disease, unspecified: Secondary | ICD-10-CM | POA: Diagnosis not present

## 2017-06-18 DIAGNOSIS — Z452 Encounter for adjustment and management of vascular access device: Secondary | ICD-10-CM | POA: Diagnosis not present

## 2017-06-18 DIAGNOSIS — K7581 Nonalcoholic steatohepatitis (NASH): Secondary | ICD-10-CM | POA: Diagnosis not present

## 2017-06-18 DIAGNOSIS — M150 Primary generalized (osteo)arthritis: Secondary | ICD-10-CM | POA: Diagnosis not present

## 2017-06-18 DIAGNOSIS — Z85828 Personal history of other malignant neoplasm of skin: Secondary | ICD-10-CM | POA: Diagnosis not present

## 2017-06-18 DIAGNOSIS — K2270 Barrett's esophagus without dysplasia: Secondary | ICD-10-CM | POA: Diagnosis not present

## 2017-06-18 DIAGNOSIS — K219 Gastro-esophageal reflux disease without esophagitis: Secondary | ICD-10-CM | POA: Diagnosis not present

## 2017-06-18 DIAGNOSIS — Z79899 Other long term (current) drug therapy: Secondary | ICD-10-CM | POA: Diagnosis not present

## 2017-06-18 DIAGNOSIS — I10 Essential (primary) hypertension: Secondary | ICD-10-CM | POA: Diagnosis not present

## 2017-06-18 DIAGNOSIS — Z171 Estrogen receptor negative status [ER-]: Secondary | ICD-10-CM | POA: Diagnosis not present

## 2017-06-18 DIAGNOSIS — I2510 Atherosclerotic heart disease of native coronary artery without angina pectoris: Secondary | ICD-10-CM | POA: Diagnosis not present

## 2017-07-01 DIAGNOSIS — G629 Polyneuropathy, unspecified: Secondary | ICD-10-CM | POA: Diagnosis not present

## 2017-07-01 DIAGNOSIS — C50411 Malignant neoplasm of upper-outer quadrant of right female breast: Secondary | ICD-10-CM | POA: Diagnosis not present

## 2017-07-01 DIAGNOSIS — Z9221 Personal history of antineoplastic chemotherapy: Secondary | ICD-10-CM | POA: Diagnosis not present

## 2017-07-01 DIAGNOSIS — Z7982 Long term (current) use of aspirin: Secondary | ICD-10-CM | POA: Diagnosis not present

## 2017-07-01 DIAGNOSIS — R600 Localized edema: Secondary | ICD-10-CM | POA: Diagnosis not present

## 2017-07-01 DIAGNOSIS — Z885 Allergy status to narcotic agent status: Secondary | ICD-10-CM | POA: Diagnosis not present

## 2017-07-01 DIAGNOSIS — Z923 Personal history of irradiation: Secondary | ICD-10-CM | POA: Diagnosis not present

## 2017-07-01 DIAGNOSIS — Z171 Estrogen receptor negative status [ER-]: Secondary | ICD-10-CM | POA: Diagnosis not present

## 2017-07-01 DIAGNOSIS — Z79899 Other long term (current) drug therapy: Secondary | ICD-10-CM | POA: Diagnosis not present

## 2017-07-09 ENCOUNTER — Other Ambulatory Visit: Payer: Self-pay | Admitting: Internal Medicine

## 2017-07-19 ENCOUNTER — Other Ambulatory Visit: Payer: Self-pay | Admitting: Internal Medicine

## 2017-07-27 DIAGNOSIS — G4733 Obstructive sleep apnea (adult) (pediatric): Secondary | ICD-10-CM | POA: Diagnosis not present

## 2017-08-08 IMAGING — US US ABDOMEN LIMITED
1 series · 14 of 25 positions shown · non-contrast
Comparison: CT 01/22/2016.

CLINICAL DATA: Breast cancer.  NASH.

EXAM:
US ABDOMEN LIMITED - RIGHT UPPER QUADRANT

[Series 1: us abdomen limited · 0.22mm/px · 14 of 86 slices shown]
[im 1/86]
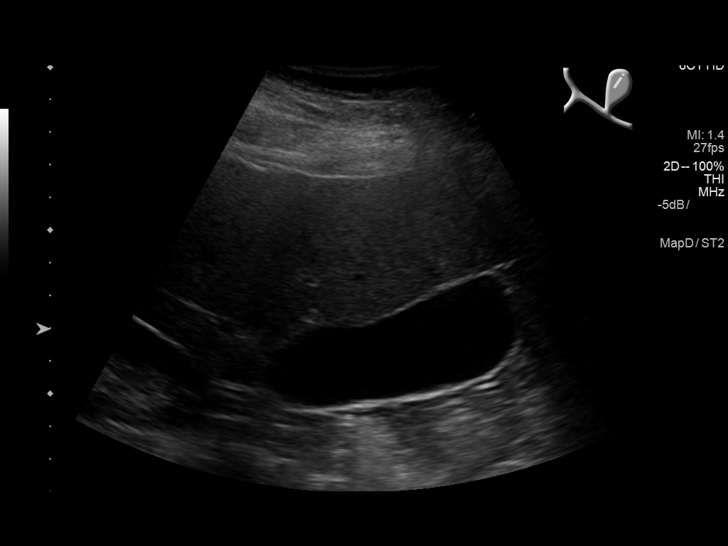
[im 8/86]
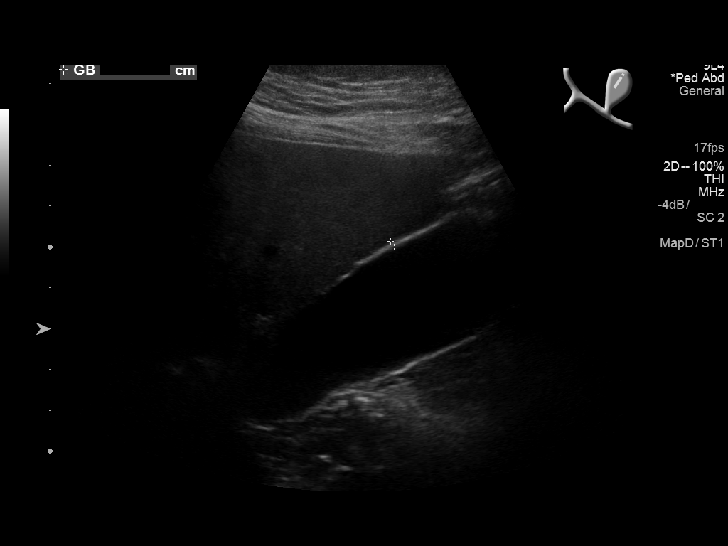
[im 15/86]
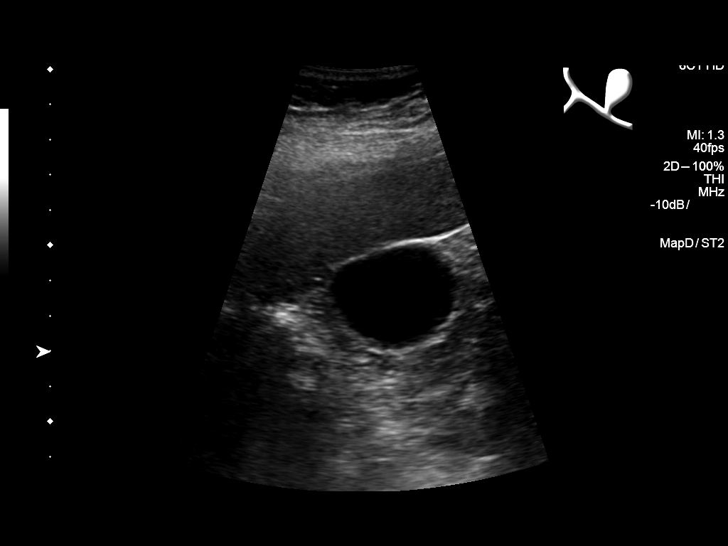
[im 22/86]
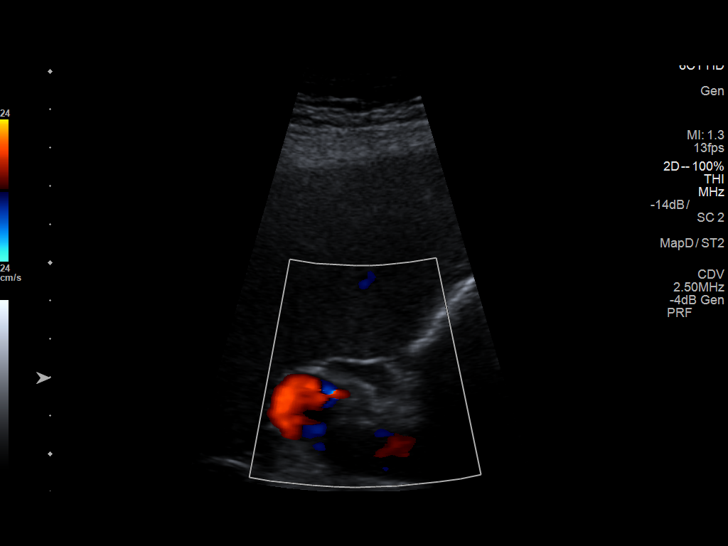
[im 29/86]
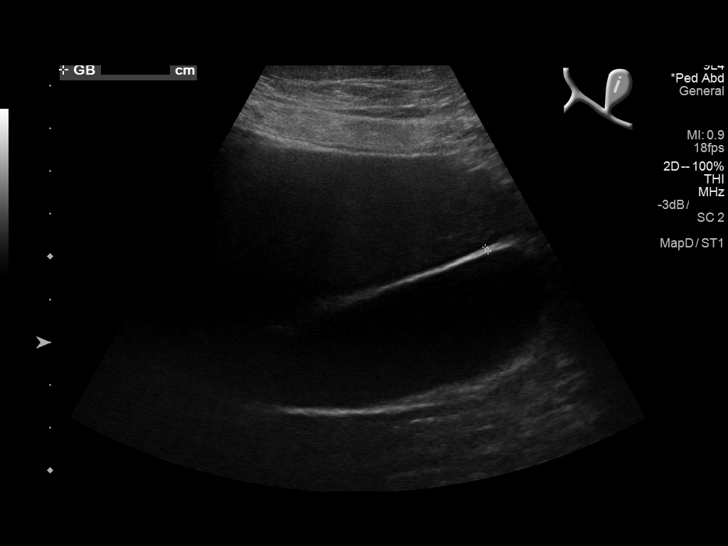
[im 32/86]
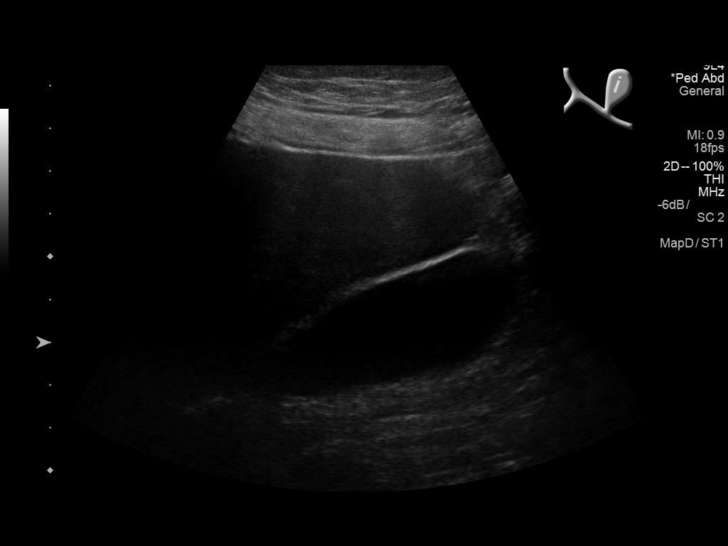
[im 39/86]
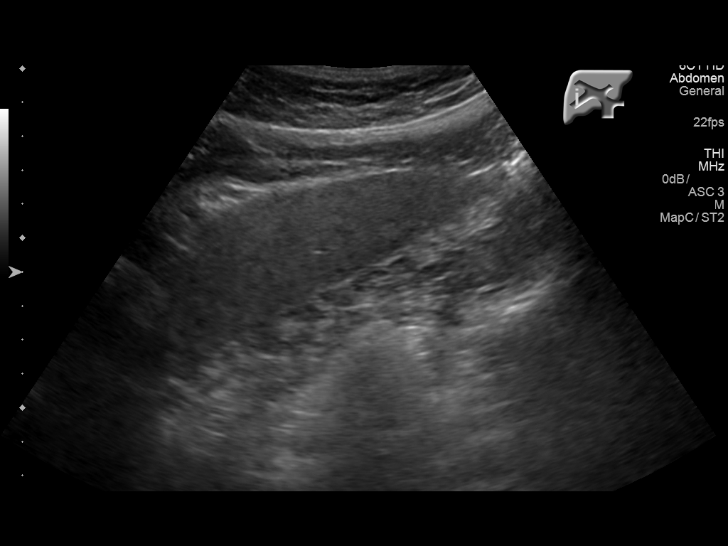
[im 47/86]
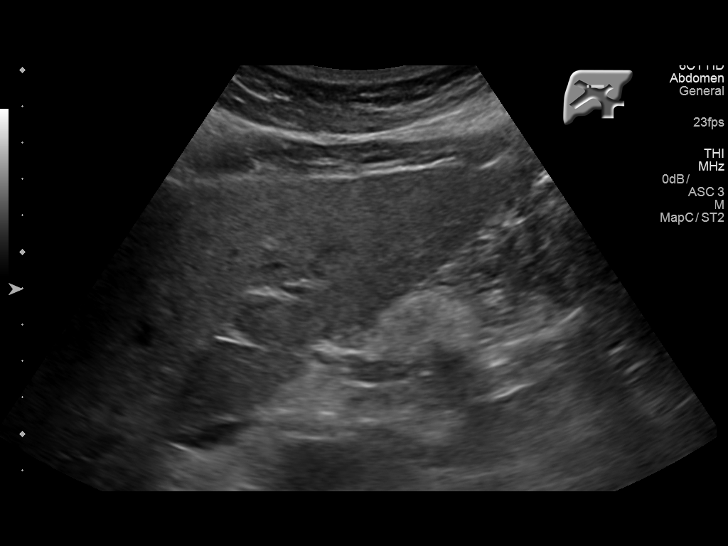
[im 54/86]
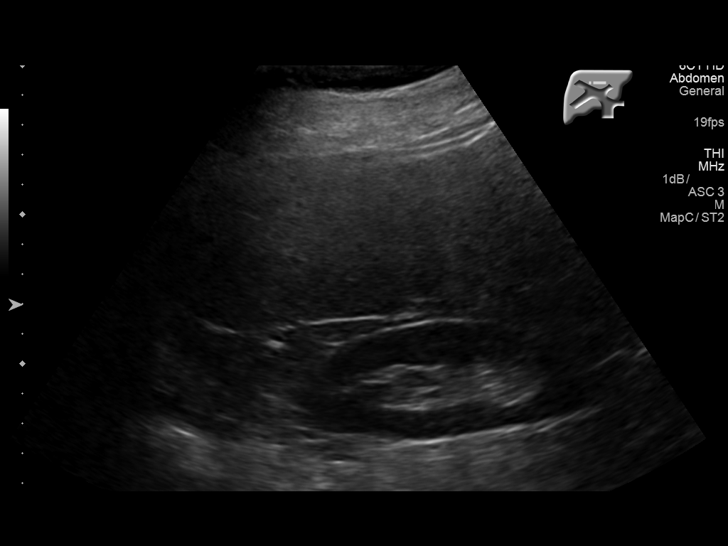
[im 57/86]
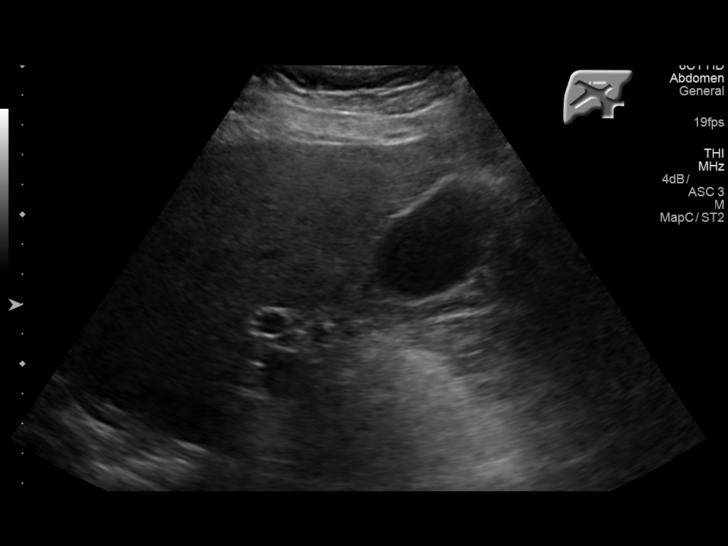
[im 64/86]
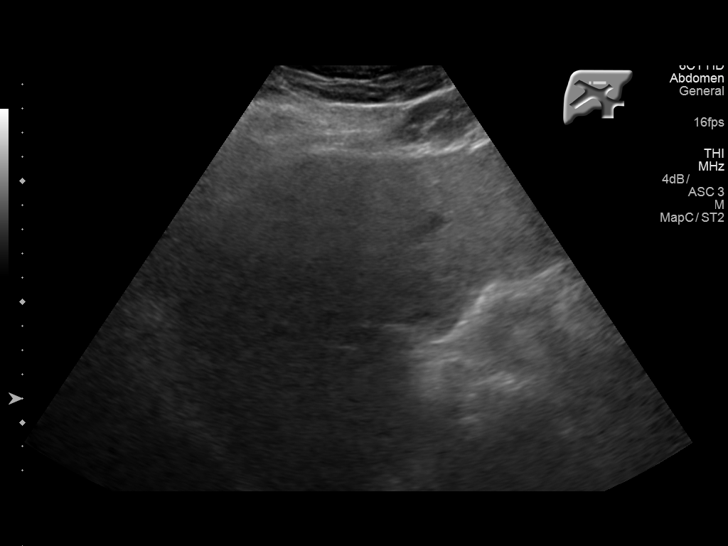
[im 71/86]
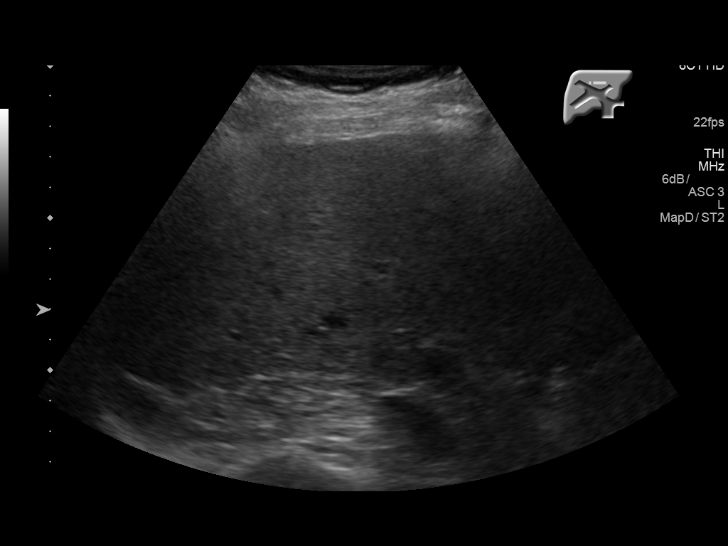
[im 78/86]
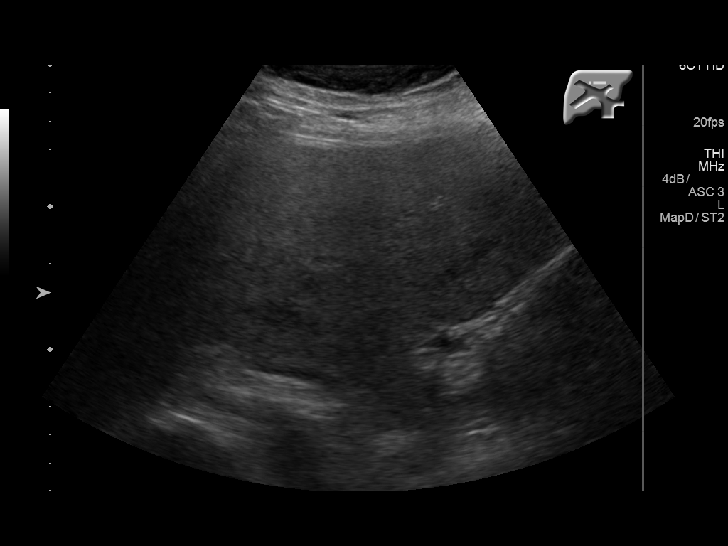
[im 86/86]
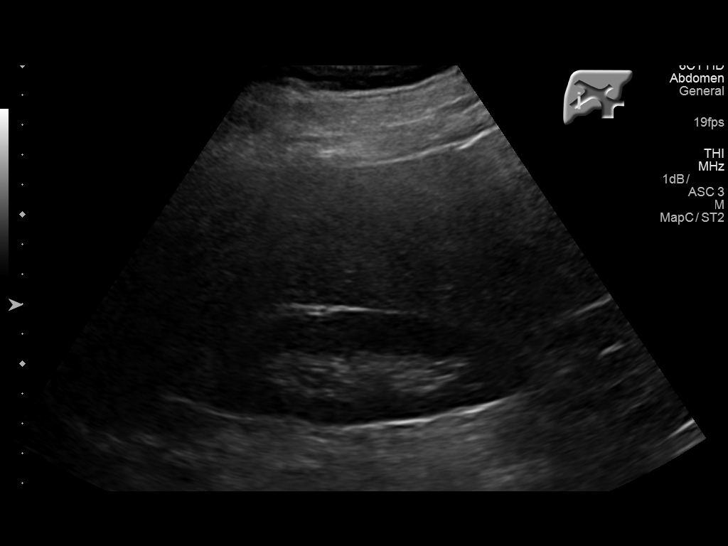

[14 of 25 positions shown; findings below may reference images not displayed]

FINDINGS: Gallbladder:

No gallstones or wall thickening visualized. No sonographic Murphy
sign noted by sonographer.

Common bile duct:

Diameter: 5.4 mm

Liver:

There is echogenic consistent fatty infiltration and/or
hepatocellular disease. No focal hepatic abnormality identified.
Portal vein patent.
IMPRESSION: Liver is echogenic consistent with fatty infiltration and/or
hepatocellular disease. No focal hepatic abnormality. No gallstones
or biliary distention.

## 2017-08-12 ENCOUNTER — Encounter: Payer: Self-pay | Admitting: Cardiovascular Disease

## 2017-08-12 ENCOUNTER — Ambulatory Visit (INDEPENDENT_AMBULATORY_CARE_PROVIDER_SITE_OTHER): Payer: Medicare Other | Admitting: Cardiovascular Disease

## 2017-08-12 VITALS — BP 132/72 | HR 77 | Ht 62.0 in | Wt 185.5 lb

## 2017-08-12 DIAGNOSIS — I2510 Atherosclerotic heart disease of native coronary artery without angina pectoris: Secondary | ICD-10-CM

## 2017-08-12 DIAGNOSIS — I10 Essential (primary) hypertension: Secondary | ICD-10-CM | POA: Diagnosis not present

## 2017-08-12 DIAGNOSIS — E7800 Pure hypercholesterolemia, unspecified: Secondary | ICD-10-CM

## 2017-08-12 NOTE — Patient Instructions (Signed)
Medication Instructions: Continue same medications.   Labwork: None.   Procedures/Testing: None.   Follow-Up: 1 year with Dr. Christen Wardrop.   Any Additional Special Instructions Will Be Listed Below (If Applicable).     If you need a refill on your cardiac medications before your next appointment, please call your pharmacy.   

## 2017-08-12 NOTE — Progress Notes (Signed)
Cardiology Office Note   Date:  08/12/2017   ID:  [name], [name] December 06, 1945, MRN [id number]  PCP:  Traci Mc, MD  Cardiologist:   [name], MD   Chief Complaint  Patient presents with  . Other    6 month follow up. Patient c/o a few rapid heart beats that are not consistant. Meds reviewed verbally with patient.       History of Present Illness: Traci Hall is a 72 y.o. female who presents for  a follow-up visit regarding  coronary atherosclerosis noted on CT scan of the chest.  She has known history of diet-controlled diabetes, previous tobacco use with mild COPD with an asthmatic component, essential hypertension, sleep apnea on CPAP, GERD, hyperlipidemia with intolerance to statins and obesity. She quit smoking in 2007. She has no family history of premature coronary artery disease.   Exercise nuclear stress test in September 2016 was low risk. She did have mild ischemic EKG changes with exercise but perfusion was normal and ejection fraction was greater than 65%.  She finished treatment for breast cancer. She has been doing very well with no chest pain, shortness of breath or leg edema.  She reports rare episodes of nighttime palpitations lasting for about 1-2 minutes.  These usually happen once every 3-4 months.  Past Medical History:  Diagnosis Date  . Allergic rhinitis   . Asthma   . Barrett's esophagus   . Cancer ([place])   . COPD (chronic obstructive pulmonary disease) ([place])    by prior PFTs  . GERD (gastroesophageal reflux disease)   . Hyperlipidemia   . Hypertension   . Obesity   . OSA on CPAP   . Personal history of tobacco use, presenting hazards to health 01/08/2015  . Polyp of colon     Past Surgical History:  Procedure Laterality Date  . ABDOMINAL HYSTERECTOMY  1991   secondary to endometriosis, and polyps  . APPENDECTOMY  1977  . BREAST BIOPSY Right 02/24/2016   path pending/biopsy  . BREAST SURGERY  03/27/2016   Lymph node  removal  . COLONOSCOPY    . ESOPHAGOGASTRODUODENOSCOPY    . ESOPHAGOGASTRODUODENOSCOPY (EGD) WITH PROPOFOL N/A 03/17/2016   Procedure: ESOPHAGOGASTRODUODENOSCOPY (EGD) WITH PROPOFOL;  Surgeon: [name], MD;  Location: [facility] ENDOSCOPY;  Service: Endoscopy;  Laterality: N/A;  . LIVER BIOPSY    . NASAL SINUS SURGERY  2008  . SKIN CANCER EXCISION  2015, 2017     Current Outpatient Medications  Medication Sig Dispense Refill  . ALPRAZolam (XANAX) 0.25 MG tablet Take 1 tablet (0.25 mg total) by mouth at bedtime as needed for anxiety. 30 tablet 0  . amLODipine (NORVASC) 5 MG tablet Take 1 tablet (5 mg total) by mouth daily. 90 tablet 3  . aspirin 81 MG tablet Take 81 mg by mouth daily.    . bimatoprost (LUMIGAN) 0.03 % ophthalmic solution Place 1 drop into both eyes at bedtime.    Marland Kitchen BREO ELLIPTA 100-25 MCG/INH AEPB USE 1 INHALATION DAILY 360 each 2  . Cholecalciferol (VITAMIN D-3) 1000 UNITS CAPS Take 1 capsule by mouth daily.    Marland Kitchen desloratadine (CLARINEX) 5 MG tablet TAKE 1 TABLET DAILY 90 tablet 3  . furosemide (LASIX) 20 MG tablet take 1 tablet by mouth once daily if needed 30 tablet 3  . hydrocortisone (ANUSOL-HC) 25 MG suppository Place 1 suppository (25 mg total) rectally 2 (two) times daily. 12 suppository 0  . Multiple Vitamins-Minerals (CENTRUM SILVER  PO) Take by mouth. Take 1 am     . olopatadine (PATANOL) 0.1 % ophthalmic solution Place 1 drop into both eyes as needed.     . Omega-3 Fatty Acids (EQL OMEGA 3 FISH OIL) 1400 MG CAPS Take 1,400 mg by mouth daily.    . pantoprazole (PROTONIX) 40 MG tablet TAKE 1 TABLET TWICE A DAY 180 tablet 1  . Polyethyl Glycol-Propyl Glycol (SYSTANE OP) Apply to eye. 1-2 drops in each eye as needed.    Marland Kitchen PROAIR HFA 108 (90 Base) MCG/ACT inhaler USE 2 INHALATIONS EVERY MORNING ONLY 25.5 g 3  . vitamin C (ASCORBIC ACID) 500 MG tablet Take 500 mg by mouth daily.    . vitamin E (VITAMIN E) 400 UNIT capsule Take 400 Units by mouth daily.    Marland Kitchen  losartan (COZAAR) 100 MG tablet Take 1 tablet (100 mg total) by mouth daily. 90 tablet 3   No current facility-administered medications for this visit.     Allergies:   Alendronate sodium; Demerol; Naproxen; and Statins    Social History:  The patient  reports that she quit smoking about 12 years ago. Her smoking use included cigarettes. She has a 67.50 pack-year smoking history. she has never used smokeless tobacco. She reports that she drinks about 1.2 oz of alcohol per week. She reports that she does not use drugs.   Family History:  The patient's family history includes COPD in her mother; Emphysema in her mother; Heart disease in her father; Hyperlipidemia in her brother, brother, brother, daughter, father, and son; Hypertension in her brother, daughter, and mother; Stroke in her maternal grandfather.    ROS:  Please see the history of present illness.   Otherwise, review of systems are positive for none.   All other systems are reviewed and negative.    PHYSICAL EXAM: VS:  BP 132/72 (BP Location: Left Arm, Patient Position: Sitting, Cuff Size: Normal)   Pulse 77   Ht 5\' 2"  (1.575 m)   Wt 185 lb 8 oz (84.1 kg)   BMI 33.93 kg/m  , BMI Body mass index is 33.93 kg/m. GEN: Well nourished, well developed, in no acute distress  HEENT: normal  Neck: no JVD, carotid bruits, or masses Cardiac: RRR; no rubs, or gallops,no edema . 1/6 systolic ejection murmur at the base of the heart Respiratory:  clear to auscultation bilaterally, normal work of breathing GI: soft, nontender, nondistended, + BS MS: no deformity or atrophy  Skin: warm and dry, no rash Neuro:  Strength and sensation are intact Psych: euthymic mood, full affect   EKG:  EKG is ordered today. The ekg ordered today demonstrates  normal sinus rhythm with nonspecific T wave changes.   Recent Labs: 11/26/2016: TSH 2.66 06/02/2017: ALT 80; BUN 15; Creatinine, Ser 0.88; Potassium 4.3; Sodium 139    Lipid Panel      Component Value Date/Time   CHOL 231 (H) 06/02/2017 0924   CHOL 216 (H) 04/12/2015 0837   TRIG 168.0 (H) 06/02/2017 0924   HDL 52.80 06/02/2017 0924   HDL 51 04/12/2015 0837   CHOLHDL 4 06/02/2017 0924   VLDL 33.6 06/02/2017 0924   LDLCALC 144 (H) 06/02/2017 0924   LDLCALC 131 (H) 04/12/2015 0837   LDLDIRECT 169.0 11/27/2015 1020      Wt Readings from Last 3 Encounters:  08/12/17 185 lb 8 oz (84.1 kg)  06/02/17 185 lb 6.4 oz (84.1 kg)  03/26/17 187 lb (84.8 kg)  No flowsheet data found.    ASSESSMENT AND PLAN:  1.  Coronary atherosclerosis without angina: Previous stress test in September 2016 was unremarkable. Continue low-dose aspirin.  I discussed with her the importance of healthy lifestyle changes including exercise and healthy diet.  2. Essential hypertension:  Blood pressure improved significantly with the addition of amlodipine.  3. Hyperlipidemia: The patient has not been able to tolerate any of the statins most recently rosuvastatin.  She also did not tolerate Zetia before.  Most recent LDL was 144.  I again discussed with her the possibility of treatment with a PC SK 9 inhibitor but she does not want an injection at the present time.  She wants to try with healthy lifestyle changes for about 6 months before making the decision.  Disposition:   FU with me in 12 months  Signed,  [name], MD  08/12/2017 9:42 AM    [place]

## 2017-08-21 ENCOUNTER — Other Ambulatory Visit: Payer: Self-pay | Admitting: Cardiovascular Disease

## 2017-08-23 ENCOUNTER — Telehealth: Payer: Self-pay | Admitting: Cardiovascular Disease

## 2017-08-23 NOTE — Telephone Encounter (Signed)
Patient received recall for losartan please call to discuss

## 2017-08-23 NOTE — Telephone Encounter (Signed)
Spoke with pt and she received a letter from [facility] letting her know that her Losartan was part of the recall and she needed a replacement.  Advised I will send message to Dr. Fletcher Anon for a replacement medication.

## 2017-08-23 NOTE — Telephone Encounter (Signed)
Left message to call back  

## 2017-08-23 NOTE — Telephone Encounter (Signed)
Availability has been different at every pharmacy; certain lots of irbesartan, valsartan, and losartan have been recalled but some pharmacies have had available product that has not been affected. Could switch to equivalent dose of olmesartan 40mg  daily or candesartan 32mg  daily. If these are on back order or too expensive, equivalent ACEi dose would be lisinopril 40mg  daily.

## 2017-08-23 NOTE — Telephone Encounter (Signed)
It is hard to keep up with all the recalls on ARB's.  Can you please check with our pharmacists and see what they recommend as an alternative?

## 2017-08-24 MED ORDER — OLMESARTAN MEDOXOMIL 40 MG PO TABS: 40.0000 mg | ORAL_TABLET | Freq: Every day | ORAL | 2 refills | Status: DC

## 2017-08-24 NOTE — Telephone Encounter (Signed)
Patient verbalized understanding of recommendations. She is agreeable to try alternative medication. Rx for olmesartan sent to [facility] for patient. She will call back if she has any issues with cost or back order to try another alternative.

## 2017-09-13 DIAGNOSIS — C50411 Malignant neoplasm of upper-outer quadrant of right female breast: Secondary | ICD-10-CM | POA: Diagnosis not present

## 2017-09-13 DIAGNOSIS — Z171 Estrogen receptor negative status [ER-]: Secondary | ICD-10-CM | POA: Diagnosis not present

## 2017-10-12 DIAGNOSIS — J301 Allergic rhinitis due to pollen: Secondary | ICD-10-CM | POA: Diagnosis not present

## 2017-10-12 DIAGNOSIS — G4733 Obstructive sleep apnea (adult) (pediatric): Secondary | ICD-10-CM | POA: Diagnosis not present

## 2017-11-22 DIAGNOSIS — H401131 Primary open-angle glaucoma, bilateral, mild stage: Secondary | ICD-10-CM | POA: Diagnosis not present

## 2017-11-25 LAB — HM DIABETES EYE EXAM

## 2017-11-26 DIAGNOSIS — H401131 Primary open-angle glaucoma, bilateral, mild stage: Secondary | ICD-10-CM | POA: Diagnosis not present

## 2017-11-26 LAB — HM DIABETES EYE EXAM

## 2017-11-29 ENCOUNTER — Ambulatory Visit (INDEPENDENT_AMBULATORY_CARE_PROVIDER_SITE_OTHER): Payer: Medicare Other | Admitting: Internal Medicine

## 2017-11-29 ENCOUNTER — Ambulatory Visit (INDEPENDENT_AMBULATORY_CARE_PROVIDER_SITE_OTHER): Payer: Medicare Other

## 2017-11-29 ENCOUNTER — Encounter: Payer: Self-pay | Admitting: Internal Medicine

## 2017-11-29 VITALS — BP 124/68 | HR 82 | Temp 98.2°F | Resp 15 | Ht 62.0 in | Wt 187.6 lb

## 2017-11-29 VITALS — BP 124/60 | HR 82 | Temp 98.2°F | Resp 15 | Ht 62.0 in | Wt 187.0 lb

## 2017-11-29 DIAGNOSIS — E7800 Pure hypercholesterolemia, unspecified: Secondary | ICD-10-CM | POA: Diagnosis not present

## 2017-11-29 DIAGNOSIS — K760 Fatty (change of) liver, not elsewhere classified: Secondary | ICD-10-CM | POA: Diagnosis not present

## 2017-11-29 DIAGNOSIS — G4733 Obstructive sleep apnea (adult) (pediatric): Secondary | ICD-10-CM | POA: Diagnosis not present

## 2017-11-29 DIAGNOSIS — C50411 Malignant neoplasm of upper-outer quadrant of right female breast: Secondary | ICD-10-CM | POA: Diagnosis not present

## 2017-11-29 DIAGNOSIS — D649 Anemia, unspecified: Secondary | ICD-10-CM

## 2017-11-29 DIAGNOSIS — Z171 Estrogen receptor negative status [ER-]: Secondary | ICD-10-CM

## 2017-11-29 DIAGNOSIS — E119 Type 2 diabetes mellitus without complications: Secondary | ICD-10-CM | POA: Diagnosis not present

## 2017-11-29 DIAGNOSIS — Z0000 Encounter for general adult medical examination without abnormal findings: Secondary | ICD-10-CM | POA: Diagnosis not present

## 2017-11-29 DIAGNOSIS — I2510 Atherosclerotic heart disease of native coronary artery without angina pectoris: Secondary | ICD-10-CM | POA: Diagnosis not present

## 2017-11-29 DIAGNOSIS — E559 Vitamin D deficiency, unspecified: Secondary | ICD-10-CM | POA: Diagnosis not present

## 2017-11-29 LAB — CBC WITH DIFFERENTIAL/PLATELET
BASOS ABS: 0 10*3/uL (ref 0.0–0.1)
Basophils Relative: 0.7 % (ref 0.0–3.0)
EOS PCT: 2.7 % (ref 0.0–5.0)
Eosinophils Absolute: 0.2 10*3/uL (ref 0.0–0.7)
HEMATOCRIT: 37.6 % (ref 36.0–46.0)
Hemoglobin: 12.7 g/dL (ref 12.0–15.0)
LYMPHS ABS: 2.6 10*3/uL (ref 0.7–4.0)
LYMPHS PCT: 44.5 % (ref 12.0–46.0)
MCHC: 33.8 g/dL (ref 30.0–36.0)
MCV: 90.2 fl (ref 78.0–100.0)
Monocytes Absolute: 0.5 10*3/uL (ref 0.1–1.0)
Monocytes Relative: 8.6 % (ref 3.0–12.0)
NEUTROS ABS: 2.5 10*3/uL (ref 1.4–7.7)
Neutrophils Relative %: 43.5 % (ref 43.0–77.0)
Platelets: 210 10*3/uL (ref 150.0–400.0)
RBC: 4.17 Mil/uL (ref 3.87–5.11)
RDW: 14 % (ref 11.5–15.5)
WBC: 5.8 10*3/uL (ref 4.0–10.5)

## 2017-11-29 LAB — COMPREHENSIVE METABOLIC PANEL
ALK PHOS: 74 U/L (ref 39–117)
ALT: 64 U/L — ABNORMAL HIGH (ref 0–35)
AST: 43 U/L — ABNORMAL HIGH (ref 0–37)
Albumin: 3.9 g/dL (ref 3.5–5.2)
BUN: 14 mg/dL (ref 6–23)
CO2: 28 mEq/L (ref 19–32)
Calcium: 9.5 mg/dL (ref 8.4–10.5)
Chloride: 102 mEq/L (ref 96–112)
Creatinine, Ser: 0.8 mg/dL (ref 0.40–1.20)
GFR: 74.94 mL/min (ref >60.00)
GLUCOSE: 157 mg/dL — AB (ref 70–99)
POTASSIUM: 4.1 meq/L (ref 3.5–5.1)
SODIUM: 141 meq/L (ref 135–145)
TOTAL PROTEIN: 6.7 g/dL (ref 6.0–8.3)
Total Bilirubin: 0.4 mg/dL (ref 0.2–1.2)

## 2017-11-29 LAB — LIPID PANEL
Cholesterol: 264 mg/dL — ABNORMAL HIGH (ref 0–200)
HDL: 50.9 mg/dL (ref >39.00)
NonHDL: 213.06
Total CHOL/HDL Ratio: 5
Triglycerides: 217 mg/dL — ABNORMAL HIGH (ref 0.0–149.0)
VLDL: 43.4 mg/dL — ABNORMAL HIGH (ref 0.0–40.0)

## 2017-11-29 LAB — TSH: TSH: 3.14 u[IU]/mL (ref 0.35–4.50)

## 2017-11-29 LAB — VITAMIN D 25 HYDROXY (VIT D DEFICIENCY, FRACTURES): VITD: 42.59 ng/mL (ref 30.00–100.00)

## 2017-11-29 LAB — LDL CHOLESTEROL, DIRECT: Direct LDL: 182 mg/dL

## 2017-11-29 LAB — HEMOGLOBIN A1C: HEMOGLOBIN A1C: 7.7 % — AB (ref 4.6–6.5)

## 2017-11-29 MED ORDER — ZOSTER VAC RECOMB ADJUVANTED 50 MCG/0.5ML IM SUSR: 0.5000 mL | Freq: Once | INTRAMUSCULAR | 1 refills | Status: AC

## 2017-11-29 NOTE — Assessment & Plan Note (Signed)
Diagnosed by sleep study. She is wearing her CPAP every night a minimum of 6 hours per night and notes improved daytime wakefulness and decreased fatigue  

## 2017-11-29 NOTE — Assessment & Plan Note (Signed)
Diet controlled since 2014 with normoglycemia until recent treatment with steroids for COPD exacerbation. A1c is due  Lab Results  Component Value Date   HGBA1C 7.1 (H) 06/02/2017   Lab Results  Component Value Date   MICROALBUR <0.7 11/26/2016

## 2017-11-29 NOTE — Assessment & Plan Note (Signed)
Diagnosed in 2017. S/p lumpectomy , XRT and adjuvant chemo.  Annual mammogram Jan 2019 showed no recurrence.

## 2017-11-29 NOTE — Assessment & Plan Note (Addendum)
Weight loss and daily exercise advised.

## 2017-11-29 NOTE — Progress Notes (Signed)
Subjective:   Traci Hall is a 72 y.o. female who presents for Medicare Annual (Subsequent) preventive examination.  Review of Systems:  No ROS.  Medicare Wellness Visit. Additional risk factors are reflected in the social history.  Cardiac Risk Factors include: advanced age (>4men, >1 women);obesity (BMI >30kg/m2)     Objective:     Vitals: BP 124/60 (BP Location: Left Arm, Patient Position: Sitting, Cuff Size: Normal)   Pulse 82   Temp 98.2 F (36.8 C) (Oral)   Resp 15   Ht 5\' 2"  (1.575 m)   Wt 187 lb (84.8 kg)   SpO2 96%   BMI 34.20 kg/m   Body mass index is 34.2 kg/m.  Advanced Directives 11/29/2017 11/26/2016 07/14/2016 07/14/2016 03/17/2016  Does Patient Have a Medical Advance Directive? Yes Yes Yes Yes Yes  Type of Paramedic of [place];Living will [place];Living will Healthcare Power of [place];Living will -  Does patient want to make changes to medical advance directive? No - Patient declined No - Patient declined No - Patient declined No - Patient declined -  Copy of [place] in Chart? No - copy requested No - copy requested No - copy requested No - copy requested -    Tobacco Social History   Tobacco Use  Smoking Status Former Smoker  . Packs/day: 1.50  . Years: 45.00  . Pack years: 67.50  . Types: Cigarettes  . Last attempt to quit: 06/15/2005  . Years since quitting: 12.4  Smokeless Tobacco Never Used     Counseling given: Not Answered   Clinical Intake:  Pre-visit preparation completed: Yes  Pain : No/denies pain     Nutritional Status: BMI > 30  Obese Diabetes: Yes(Followed by pcp.)  How often do you need to have someone help you when you read instructions, pamphlets, or other written materials from your doctor or pharmacy?: 1 - Never  Interpreter Needed?: No     Past Medical History:  Diagnosis Date  . Allergic rhinitis   . Asthma     . Barrett's esophagus   . Cancer ([place])   . COPD (chronic obstructive pulmonary disease) ([place])    by prior PFTs  . GERD (gastroesophageal reflux disease)   . Hyperlipidemia   . Hypertension   . Obesity   . OSA on CPAP   . Personal history of tobacco use, presenting hazards to health 01/08/2015  . Polyp of colon    Past Surgical History:  Procedure Laterality Date  . ABDOMINAL HYSTERECTOMY  1991   secondary to endometriosis, and polyps  . APPENDECTOMY  1977  . BREAST BIOPSY Right 02/24/2016   path pending/biopsy  . BREAST SURGERY  03/27/2016   Lymph node removal  . COLONOSCOPY    . ESOPHAGOGASTRODUODENOSCOPY    . ESOPHAGOGASTRODUODENOSCOPY (EGD) WITH PROPOFOL N/A 03/17/2016   Procedure: ESOPHAGOGASTRODUODENOSCOPY (EGD) WITH PROPOFOL;  Surgeon: [name], MD;  Location: [facility] ENDOSCOPY;  Service: Endoscopy;  Laterality: N/A;  . LIVER BIOPSY    . NASAL SINUS SURGERY  2008  . SKIN CANCER EXCISION  2015, 2017   Family History  Problem Relation Age of Onset  . Heart disease Father        valvular cardiomyopathy,  mitral valve  . Hyperlipidemia Father   . COPD Mother   . Hypertension Mother   . Emphysema Mother   . Hyperlipidemia Brother   . Stroke Maternal Grandfather   . Hyperlipidemia Brother   .  Hypertension Brother   . Hyperlipidemia Brother   . Hypertension Daughter   . Hyperlipidemia Daughter   . Hyperlipidemia Son   . Autism Grandchild   . Cancer Neg Hx    Social History   Socioeconomic History  . Marital status: Married    Spouse name: Not on file  . Number of children: 2  . Years of education: Not on file  . Highest education level: Not on file  Occupational History  . Occupation: retired   Scientific laboratory technician  . Financial resource strain: Not hard at all  . Food insecurity:    Worry: Never true    Inability: Never true  . Transportation needs:    Medical: No    Non-medical: No  Tobacco Use  . Smoking status: Former Smoker    Packs/day: 1.50     Years: 45.00    Pack years: 67.50    Types: Cigarettes    Last attempt to quit: 06/15/2005    Years since quitting: 12.4  . Smokeless tobacco: Never Used  Substance and Sexual Activity  . Alcohol use: Yes    Alcohol/week: 1.2 oz    Types: 2 Glasses of wine per week    Comment: with meals/social  . Drug use: No  . Sexual activity: Yes    Birth control/protection: Post-menopausal  Lifestyle  . Physical activity:    Days per week: 0 days    Minutes per session: Not on file  . Stress: Not on file  Relationships  . Social connections:    Talks on phone: Not on file    Gets together: Not on file    Attends religious service: Not on file    Active member of club or organization: Not on file    Attends meetings of clubs or organizations: Not on file    Relationship status: Not on file  Other Topics Concern  . Not on file  Social History Narrative  . Not on file    Outpatient Encounter Medications as of 11/29/2017  Medication Sig  . ALPRAZolam (XANAX) 0.25 MG tablet Take 1 tablet (0.25 mg total) by mouth at bedtime as needed for anxiety.  Marland Kitchen aspirin 81 MG tablet Take 81 mg by mouth daily.  . bimatoprost (LUMIGAN) 0.03 % ophthalmic solution Place 1 drop into both eyes at bedtime.  Marland Kitchen BREO ELLIPTA 100-25 MCG/INH AEPB USE 1 INHALATION DAILY  . Cholecalciferol (VITAMIN D-3) 1000 UNITS CAPS Take 1 capsule by mouth daily.  . furosemide (LASIX) 20 MG tablet take 1 tablet by mouth once daily if needed  . loratadine (CLARITIN) 10 MG tablet Take 10 mg by mouth daily.  . Multiple Vitamins-Minerals (CENTRUM SILVER PO) Take by mouth. Take 1 am   . olmesartan (BENICAR) 40 MG tablet Take 1 tablet (40 mg total) by mouth daily.  Marland Kitchen olopatadine (PATANOL) 0.1 % ophthalmic solution Place 1 drop into both eyes as needed.   . Omega-3 Fatty Acids (EQL OMEGA 3 FISH OIL) 1400 MG CAPS Take 1,400 mg by mouth daily.  . pantoprazole (PROTONIX) 40 MG tablet TAKE 1 TABLET TWICE A DAY  . Polyethyl Glycol-Propyl  Glycol (SYSTANE OP) Apply to eye. 1-2 drops in each eye as needed.  Marland Kitchen PROAIR HFA 108 (90 Base) MCG/ACT inhaler USE 2 INHALATIONS EVERY MORNING ONLY  . vitamin C (ASCORBIC ACID) 500 MG tablet Take 500 mg by mouth daily.  . vitamin E (VITAMIN E) 400 UNIT capsule Take 400 Units by mouth daily.  Marland Kitchen Zoster Vaccine Adjuvanted (  SHINGRIX) injection Inject 0.5 mLs into the muscle once for 1 dose.  Marland Kitchen amLODipine (NORVASC) 5 MG tablet Take 1 tablet (5 mg total) by mouth daily.   No facility-administered encounter medications on file as of 11/29/2017.     Activities of Daily Living In your present state of health, do you have any difficulty performing the following activities: 11/29/2017  Hearing? Y  Comment Hearing aid, bilateral  Vision? N  Difficulty concentrating or making decisions? N  Walking or climbing stairs? N  Dressing or bathing? N  Doing errands, shopping? N  Preparing Food and eating ? N  Using the Toilet? N  In the past six months, have you accidently leaked urine? N  Do you have problems with loss of bowel control? N  Managing your Medications? N  Managing your Finances? N  Housekeeping or managing your Housekeeping? N  Some recent data might be hidden    Patient Care Team: [name], MD as PCP - General (Internal Medicine) Bary Castilla, Forest Gleason, MD (General Surgery) [name], MD (Internal Medicine)    Assessment:   This is a routine wellness examination for [place].  The goal of the wellness visit is to assist the patient how to close the gaps in care and create a preventative care plan for the patient.   The roster of all physicians providing medical care to patient is listed in the Snapshot section of the chart.  Taking calcium VIT D as appropriate/Osteoporosis risk reviewed.    Safety issues reviewed; Smoke and carbon monoxide detectors in the home. No firearms in the home. Wears seatbelts when driving or riding with others. No violence in the home.  They do  not have excessive sun exposure.  Discussed the need for sun protection: hats, long sleeves and the use of sunscreen if there is significant sun exposure.  Patient is alert, normal appearance, oriented to person/place/and time. Correctly identified the president of the [place] and recalls of 3/3 words. Performs simple calculations and can read correct time from watch face. Displays appropriate judgement.  No new identified risk were noted.  No failures at ADL's or IADL's.   BMI- discussed the importance of a healthy diet, water intake and the benefits of aerobic exercise. Educational material provided.   24 hour diet recall: Low sodium diet  Dental- every 6 months.  Eye- Visual acuity not assessed per patient preference since they have regular follow up with the ophthalmologist.    Sleep patterns- Sleeps 8 hours at night.  Wakes feeling rested. CPAP in use.  Patient Concerns: None at this time. Follow up with PCP as needed.  Exercise Activities and Dietary recommendations Current Exercise Habits: The patient does not participate in regular exercise at present  Goals    . DIET - INCREASE LEAN PROTEINS     Low carb diet.  Low cholesterol diet.  Educational material provided.      . Increase physical activity     Stationary bike for exercise 5 days weekly, 41min       Fall Risk Fall Risk  11/29/2017 11/29/2017 03/03/2017 11/26/2016 11/27/2015  Falls in the past year? No No No No No   Depression Screen PHQ 2/9 Scores 11/29/2017 11/26/2016 11/26/2014 05/30/2014  PHQ - 2 Score 0 0 0 0  PHQ- 9 Score 1 0 - -     Cognitive Function MMSE - Mini Mental State Exam 11/29/2017 11/26/2016  Orientation to time 5 5  Orientation to Place 5 5  Registration  3 3  Attention/ Calculation 5 5  Recall 3 3  Language- name 2 objects 2 2  Language- repeat 1 1  Language- follow 3 step command 3 3  Language- read & follow direction 1 1  Write a sentence 1 1  Copy design 1 1  Total score 30 30         Immunization History  Administered Date(s) Administered  . Hep A / Hep B 05/19/2013, 06/20/2013, 11/17/2013  . Influenza Split 03/21/2014  . Influenza Whole 02/23/2011, 03/07/2012  . Influenza, High Dose Seasonal PF 03/03/2017  . Influenza-Unspecified 03/09/2013, 02/28/2015, 02/14/2016  . Pneumococcal Conjugate-13 03/07/2009, 08/14/2013  . Pneumococcal Polysaccharide-23 03/07/2009, 08/08/2015  . Tdap 07/21/2012  . Zoster 06/16/2007   Screening Tests Health Maintenance  Topic Date Due  . FOOT EXAM  05/28/2017  . HEMOGLOBIN A1C  12/01/2017  . OPHTHALMOLOGY EXAM  12/10/2017  . MAMMOGRAM  01/01/2018  . INFLUENZA VACCINE  01/13/2018  . COLONOSCOPY  03/07/2022  . TETANUS/TDAP  07/21/2022  . DEXA SCAN  Completed  . Hepatitis C Screening  Completed  . PNA vac Low Risk Adult  Completed      Plan:    End of life planning; Advance aging; Advanced directives discussed. Copy of current HCPOA/Living Will requested.    I have personally reviewed and noted the following in the patient's chart:   . Medical and social history . Use of alcohol, tobacco or illicit drugs  . Current medications and supplements . Functional ability and status . Nutritional status . Physical activity . Advanced directives . List of other physicians . Hospitalizations, surgeries, and ER visits in previous 12 months . Vitals . Screenings to include cognitive, depression, and falls . Referrals and appointments  In addition, I have reviewed and discussed with patient certain preventive protocols, quality metrics, and best practice recommendations. A written personalized care plan for preventive services as well as general preventive health recommendations were provided to patient.     Varney Biles, LPN  [id number]

## 2017-11-29 NOTE — Patient Instructions (Signed)
I want you to lose 8 lbs over hte next 3-6 months using exercise and low glycemic index diet   The ShingRx vaccine is now available in local pharmacies and is much more protective thant Zostavaxs,  It is therefore ADVISED for all interested adults over 50 to prevent shingles    Health Maintenance for Postmenopausal Women Menopause is a normal process in which your reproductive ability comes to an end. This process happens gradually over a span of months to years, usually between the ages of 32 and 25. Menopause is complete when you have missed 12 consecutive menstrual periods. It is important to talk with your health care provider about some of the most common conditions that affect postmenopausal women, such as heart disease, cancer, and bone loss (osteoporosis). Adopting a healthy lifestyle and getting preventive care can help to promote your health and wellness. Those actions can also lower your chances of developing some of these common conditions. What should I know about menopause? During menopause, you may experience a number of symptoms, such as:  Moderate-to-severe hot flashes.  Night sweats.  Decrease in sex drive.  Mood swings.  Headaches.  Tiredness.  Irritability.  Memory problems.  Insomnia.  Choosing to treat or not to treat menopausal changes is an individual decision that you make with your health care provider. What should I know about hormone replacement therapy and supplements? Hormone therapy products are effective for treating symptoms that are associated with menopause, such as hot flashes and night sweats. Hormone replacement carries certain risks, especially as you become older. If you are thinking about using estrogen or estrogen with progestin treatments, discuss the benefits and risks with your health care provider. What should I know about heart disease and stroke? Heart disease, heart attack, and stroke become more likely as you age. This may be due, in  part, to the hormonal changes that your body experiences during menopause. These can affect how your body processes dietary fats, triglycerides, and cholesterol. Heart attack and stroke are both medical emergencies. There are many things that you can do to help prevent heart disease and stroke:  Have your blood pressure checked at least every 1-2 years. High blood pressure causes heart disease and increases the risk of stroke.  If you are 30-11 years old, ask your health care provider if you should take aspirin to prevent a heart attack or a stroke.  Do not use any tobacco products, including cigarettes, chewing tobacco, or electronic cigarettes. If you need help quitting, ask your health care provider.  It is important to eat a healthy diet and maintain a healthy weight. ? Be sure to include plenty of vegetables, fruits, low-fat dairy products, and lean protein. ? Avoid eating foods that are high in solid fats, added sugars, or salt (sodium).  Get regular exercise. This is one of the most important things that you can do for your health. ? Try to exercise for at least 150 minutes each week. The type of exercise that you do should increase your heart rate and make you sweat. This is known as moderate-intensity exercise. ? Try to do strengthening exercises at least twice each week. Do these in addition to the moderate-intensity exercise.  Know your numbers.Ask your health care provider to check your cholesterol and your blood glucose. Continue to have your blood tested as directed by your health care provider.  What should I know about cancer screening? There are several types of cancer. Take the following steps to reduce your  risk and to catch any cancer development as early as possible. Breast Cancer  Practice breast self-awareness. ? This means understanding how your breasts normally appear and feel. ? It also means doing regular breast self-exams. Let your health care provider know about  any changes, no matter how small.  If you are 27 or older, have a clinician do a breast exam (clinical breast exam or CBE) every year. Depending on your age, family history, and medical history, it may be recommended that you also have a yearly breast X-ray (mammogram).  If you have a family history of breast cancer, talk with your health care provider about genetic screening.  If you are at high risk for breast cancer, talk with your health care provider about having an MRI and a mammogram every year.  Breast cancer (BRCA) gene test is recommended for women who have family members with BRCA-related cancers. Results of the assessment will determine the need for genetic counseling and BRCA1 and for BRCA2 testing. BRCA-related cancers include these types: ? Breast. This occurs in males or females. ? Ovarian. ? Tubal. This may also be called fallopian tube cancer. ? Cancer of the abdominal or pelvic lining (peritoneal cancer). ? Prostate. ? Pancreatic.  Cervical, Uterine, and Ovarian Cancer Your health care provider may recommend that you be screened regularly for cancer of the pelvic organs. These include your ovaries, uterus, and vagina. This screening involves a pelvic exam, which includes checking for microscopic changes to the surface of your cervix (Pap test).  For women ages 21-65, health care providers may recommend a pelvic exam and a Pap test every three years. For women ages 67-65, they may recommend the Pap test and pelvic exam, combined with testing for human papilloma virus (HPV), every five years. Some types of HPV increase your risk of cervical cancer. Testing for HPV may also be done on women of any age who have unclear Pap test results.  Other health care providers may not recommend any screening for nonpregnant women who are considered low risk for pelvic cancer and have no symptoms. Ask your health care provider if a screening pelvic exam is right for you.  If you have had  past treatment for cervical cancer or a condition that could lead to cancer, you need Pap tests and screening for cancer for at least 20 years after your treatment. If Pap tests have been discontinued for you, your risk factors (such as having a new sexual partner) need to be reassessed to determine if you should start having screenings again. Some women have medical problems that increase the chance of getting cervical cancer. In these cases, your health care provider may recommend that you have screening and Pap tests more often.  If you have a family history of uterine cancer or ovarian cancer, talk with your health care provider about genetic screening.  If you have vaginal bleeding after reaching menopause, tell your health care provider.  There are currently no reliable tests available to screen for ovarian cancer.  Lung Cancer Lung cancer screening is recommended for adults 20-79 years old who are at high risk for lung cancer because of a history of smoking. A yearly low-dose CT scan of the lungs is recommended if you:  Currently smoke.  Have a history of at least 30 pack-years of smoking and you currently smoke or have quit within the past 15 years. A pack-year is smoking an average of one pack of cigarettes per day for one year.  Yearly  screening should:  Continue until it has been 15 years since you quit.  Stop if you develop a health problem that would prevent you from having lung cancer treatment.  Colorectal Cancer  This type of cancer can be detected and can often be prevented.  Routine colorectal cancer screening usually begins at age 20 and continues through age 63.  If you have risk factors for colon cancer, your health care provider may recommend that you be screened at an earlier age.  If you have a family history of colorectal cancer, talk with your health care provider about genetic screening.  Your health care provider may also recommend using home test kits to  check for hidden blood in your stool.  A small camera at the end of a tube can be used to examine your colon directly (sigmoidoscopy or colonoscopy). This is done to check for the earliest forms of colorectal cancer.  Direct examination of the colon should be repeated every 5-10 years until age 16. However, if early forms of precancerous polyps or small growths are found or if you have a family history or genetic risk for colorectal cancer, you may need to be screened more often.  Skin Cancer  Check your skin from head to toe regularly.  Monitor any moles. Be sure to tell your health care provider: ? About any new moles or changes in moles, especially if there is a change in a mole's shape or color. ? If you have a mole that is larger than the size of a pencil eraser.  If any of your family members has a history of skin cancer, especially at a young age, talk with your health care provider about genetic screening.  Always use sunscreen. Apply sunscreen liberally and repeatedly throughout the day.  Whenever you are outside, protect yourself by wearing long sleeves, pants, a wide-brimmed hat, and sunglasses.  What should I know about osteoporosis? Osteoporosis is a condition in which bone destruction happens more quickly than new bone creation. After menopause, you may be at an increased risk for osteoporosis. To help prevent osteoporosis or the bone fractures that can happen because of osteoporosis, the following is recommended:  If you are 65-58 years old, get at least 1,000 mg of calcium and at least 600 mg of vitamin D per day.  If you are older than age 15 but younger than age 66, get at least 1,200 mg of calcium and at least 600 mg of vitamin D per day.  If you are older than age 28, get at least 1,200 mg of calcium and at least 800 mg of vitamin D per day.  Smoking and excessive alcohol intake increase the risk of osteoporosis. Eat foods that are rich in calcium and vitamin D, and  do weight-bearing exercises several times each week as directed by your health care provider. What should I know about how menopause affects my mental health? Depression may occur at any age, but it is more common as you become older. Common symptoms of depression include:  Low or sad mood.  Changes in sleep patterns.  Changes in appetite or eating patterns.  Feeling an overall lack of motivation or enjoyment of activities that you previously enjoyed.  Frequent crying spells.  Talk with your health care provider if you think that you are experiencing depression. What should I know about immunizations? It is important that you get and maintain your immunizations. These include:  Tetanus, diphtheria, and pertussis (Tdap) booster vaccine.  Influenza every  year before the flu season begins.  Pneumonia vaccine.  Shingles vaccine.  Your health care provider may also recommend other immunizations. This information is not intended to replace advice given to you by your health care provider. Make sure you discuss any questions you have with your health care provider. Document Released: 07/24/2005 Document Revised: 12/20/2015 Document Reviewed: 03/05/2015 Elsevier Interactive Patient Education  2018 [employer].

## 2017-11-29 NOTE — Patient Instructions (Addendum)
  Ms. Traci Hall , Thank you for taking time to come for your Medicare Wellness Visit. I appreciate your ongoing commitment to your health goals. Please review the following plan we discussed and let me know if I can assist you in the future.   Follow up as needed.    Bring a copy of your [place] and/or Living Will to be scanned into chart.  Have a great day!  These are the goals we discussed: Goals    . DIET - INCREASE LEAN PROTEINS     Low carb diet.  Low cholesterol diet.  Educational material provided.      . Increase physical activity     Stationary bike for exercise 5 days weekly, 62min       This is a list of the screening recommended for you and due dates:  Health Maintenance  Topic Date Due  . Complete foot exam   05/28/2017  . Hemoglobin A1C  12/01/2017  . Eye exam for diabetics  12/10/2017  . Mammogram  01/01/2018  . Flu Shot  01/13/2018  . Colon Cancer Screening  03/07/2022  . Tetanus Vaccine  07/21/2022  . DEXA scan (bone density measurement)  Completed  .  Hepatitis C: One time screening is recommended by Center for Disease Control  (CDC) for  adults born from [birth year] through 1965.   Completed  . Pneumonia vaccines  Completed

## 2017-11-29 NOTE — Progress Notes (Signed)
Patient ID: Traci Hall, female    DOB: August 11, 1945  Age: 72 y.o. MRN: [id number]  The patient is here for  management of other chronic and acute problems.  Breast cancer managed by [facility]  S/p right lumpectomy , taxotere and XRT finished in  April 2018  (Herceptin  infusion finished in Nov 2018  Jan 2019 no recurrence next mammogram  Due in Jan 2020   Has had a Cough since May 30. Caught her husband's  viral URI  Still coughing for     Poor balance ,  neuropathy (walking on bubble wrap) Improving symptoms with chiropractor doing acupuncture and cold laser on the feet  the risk factors are reflected in the social history. X tobacco   The roster of all physicians providing medical care to patient - is listed in the Snapshot section of the chart.  Activities of daily living:  The patient is 100% independent in all ADLs: dressing, toileting, feeding as well as independent mobility  Home safety : The patient has smoke detectors in the home. They wear seatbelts.  There are no firearms at home. There is no violence in the home.   There is no risks for hepatitis, STDs or HIV. There is no   history of blood transfusion. They have no travel history to infectious disease endemic areas of the world.  The patient has seen their dentist in the last six month. They have seen their eye doctor in the last year. They admit to slight hearing difficulty with regard to whispered voices and some television programs.  They have deferred audiologic testing in the last year.  They do not  have excessive sun exposure. Discussed the need for sun protection: hats, long sleeves and use of sunscreen if there is significant sun exposure.   Diet: the importance of a healthy diet is discussed. They do have a healthy diet.  The benefits of regular aerobic exercise were discussed. She is not walking due to neuropapthy walks 4 times per week ,  20 minutes.   Depression screen: there are no signs or vegative symptoms of  depression- irritability, change in appetite, anhedonia, sadness/tearfullness.  Cognitive assessment: the patient manages all their financial and personal affairs and is actively engaged. They could relate day,date,year and events; recalled 2/3 objects at 3 minutes; performed clock-face test normally.  The following portions of the patient's history were reviewed and updated as appropriate: allergies, current medications, past family history, past medical history,  past surgical history, past social history  and problem list.  Visual acuity was not assessed per patient preference since she has regular follow up with her ophthalmologist. Hearing and body mass index were assessed and reviewed.   During the course of the visit the patient was educated and counseled about appropriate screening and preventive services including : fall prevention , diabetes screening, nutrition counseling, colorectal cancer screening, and recommended immunizations.    CC: The primary encounter diagnosis was Anemia, unspecified type. Diagnoses of Nonalcoholic fatty liver disease without nonalcoholic steatohepatitis (NASH), Pure hypercholesterolemia, Controlled type 2 diabetes mellitus without complication, unspecified whether long term insulin use ([place]), Vitamin D deficiency, Malignant neoplasm of upper-outer quadrant of right breast in female, estrogen receptor negative ([place]), and OSA (obstructive sleep apnea) were also pertinent to this visit.  History Anylah has a past medical history of Allergic rhinitis, Asthma, Barrett's esophagus, Cancer ([place]), COPD (chronic obstructive pulmonary disease) ([place]), GERD (gastroesophageal reflux disease), Hyperlipidemia, Hypertension, Obesity, OSA on CPAP, Personal history of tobacco use,  presenting hazards to health (01/08/2015), and Polyp of colon.   She has a past surgical history that includes Nasal sinus surgery (2008); Appendectomy (1977); Abdominal hysterectomy (1991); Skin cancer  excision (2015, 2017); Colonoscopy; Liver biopsy; Esophagogastroduodenoscopy; Esophagogastroduodenoscopy (egd) with propofol (N/A, 03/17/2016); Breast biopsy (Right, 02/24/2016); and Breast surgery (03/27/2016).   Her family history includes Autism in her grandchild; COPD in her mother; Emphysema in her mother; Heart disease in her father; Hyperlipidemia in her brother, brother, brother, daughter, father, and son; Hypertension in her brother, daughter, and mother; Stroke in her maternal grandfather.She reports that she quit smoking about 12 years ago. Her smoking use included cigarettes. She has a 67.50 pack-year smoking history. She has never used smokeless tobacco. She reports that she drinks about 1.2 oz of alcohol per week. She reports that she does not use drugs.  Outpatient Medications Prior to Visit  Medication Sig Dispense Refill  . ALPRAZolam (XANAX) 0.25 MG tablet Take 1 tablet (0.25 mg total) by mouth at bedtime as needed for anxiety. 30 tablet 0  . aspirin 81 MG tablet Take 81 mg by mouth daily.    . bimatoprost (LUMIGAN) 0.03 % ophthalmic solution Place 1 drop into both eyes at bedtime.    Marland Kitchen BREO ELLIPTA 100-25 MCG/INH AEPB USE 1 INHALATION DAILY 360 each 2  . Cholecalciferol (VITAMIN D-3) 1000 UNITS CAPS Take 1 capsule by mouth daily.    . furosemide (LASIX) 20 MG tablet take 1 tablet by mouth once daily if needed 30 tablet 3  . loratadine (CLARITIN) 10 MG tablet Take 10 mg by mouth daily.    . Multiple Vitamins-Minerals (CENTRUM SILVER PO) Take by mouth. Take 1 am     . olmesartan (BENICAR) 40 MG tablet Take 1 tablet (40 mg total) by mouth daily. 90 tablet 2  . olopatadine (PATANOL) 0.1 % ophthalmic solution Place 1 drop into both eyes as needed.     . Omega-3 Fatty Acids (EQL OMEGA 3 FISH OIL) 1400 MG CAPS Take 1,400 mg by mouth daily.    . pantoprazole (PROTONIX) 40 MG tablet TAKE 1 TABLET TWICE A DAY 180 tablet 1  . Polyethyl Glycol-Propyl Glycol (SYSTANE OP) Apply to eye. 1-2  drops in each eye as needed.    Marland Kitchen PROAIR HFA 108 (90 Base) MCG/ACT inhaler USE 2 INHALATIONS EVERY MORNING ONLY 25.5 g 3  . vitamin C (ASCORBIC ACID) 500 MG tablet Take 500 mg by mouth daily.    . vitamin E (VITAMIN E) 400 UNIT capsule Take 400 Units by mouth daily.    Marland Kitchen amLODipine (NORVASC) 5 MG tablet Take 1 tablet (5 mg total) by mouth daily. 90 tablet 3  . desloratadine (CLARINEX) 5 MG tablet TAKE 1 TABLET DAILY (Patient not taking: Reported on 11/29/2017) 90 tablet 3  . hydrocortisone (ANUSOL-HC) 25 MG suppository Place 1 suppository (25 mg total) rectally 2 (two) times daily. (Patient not taking: Reported on 11/29/2017) 12 suppository 0   No facility-administered medications prior to visit.     Review of Systems  Patient denies headache, fevers, malaise, unintentional weight loss, skin rash, eye pain, sinus congestion and sinus pain, sore throat, dysphagia,  hemoptysis ,dyspnea, wheezing, chest pain, palpitations, orthopnea, edema, abdominal pain, nausea, melena, diarrhea, constipation, flank pain, dysuria, hematuria, urinary  Frequency, nocturia, numbness, tingling, seizures,  Focal weakness, Loss of consciousness,  Tremor, insomnia, depression, anxiety, and suicidal ideation.      Objective:  BP 124/68 (BP Location: Left Arm, Patient Position: Sitting, Cuff Size: Normal)  Pulse 82   Temp 98.2 F (36.8 C) (Oral)   Resp 15   Ht 5\' 2"  (1.575 m)   Wt 187 lb 9.6 oz (85.1 kg)   SpO2 96%   BMI 34.31 kg/m   Physical Exam   General appearance: alert, cooperative and appears stated age Head: Normocephalic, without obvious abnormality, atraumatic Eyes: conjunctivae/corneas clear. PERRL, EOM's intact. Fundi benign. Ears: normal TM's and external ear canals both ears Nose: Nares normal. Septum midline. Mucosa normal. No drainage or sinus tenderness. Throat: lips, mucosa, and tongue normal; teeth and gums normal Neck: no adenopathy, no carotid bruit, no JVD, supple, symmetrical, trachea  midline and thyroid not enlarged, symmetric, no tenderness/mass/nodules Lungs: clear to auscultation bilaterally Breasts: normal appearance, no masses or tenderness Heart:  lumpectomy scar on right breast  Lower outer quadrant. No masses on exam today   Abomen: soft, non-tender; bowel sounds normal; no masses,  no organomegaly Extremities: extremities normal, atraumatic, no cyanosis or edema Pulses: 2+ and symmetric Skin: Skin color, texture, turgor normal. No rashes or lesions Neurologic: Alert and oriented X 3, normal strength and tone. Normal symmetric reflexes. Normal coordination and gait.      Assessment & Plan:   Problem List Items Addressed This Visit    Anemia - Primary   Relevant Orders   TSH   CBC with Differential/Platelet   Hyperlipidemia   Relevant Orders   Lipid panel   OSA (obstructive sleep apnea)    Diagnosed by sleep study. She is wearing her CPAP every night a minimum of 6 hours per night and notes improved daytime wakefulness and decreased fatigue          Nonalcoholic fatty liver disease without nonalcoholic steatohepatitis (NASH)    Weight loss and daily exercise advised.       Relevant Orders   Comprehensive metabolic panel   Malignant neoplasm of upper-outer quadrant of right female breast ([place])    Diagnosed in 2017. S/p lumpectomy , XRT and adjuvant chemo.  Annual mammogram Jan 2019 showed no recurrence.       DM II (diabetes mellitus, type II), controlled ([place])    Diet controlled since 2014 with normoglycemia until recent treatment with steroids for COPD exacerbation. A1c is due  Lab Results  Component Value Date   HGBA1C 7.1 (H) 06/02/2017   Lab Results  Component Value Date   MICROALBUR <0.7 11/26/2016         Relevant Orders   Hemoglobin A1c    Other Visit Diagnoses    Vitamin D deficiency       Relevant Orders   VITAMIN D 25 Hydroxy (Vit-D Deficiency, Fractures)     A total of 25 minutes was spent with patient more than  half of which was spent in counseling patient on the above mentioned issues , including the need to start a regular exercise program and work on weight loss. Discussed the options given her neuropathy.       I have discontinued [place]'s hydrocortisone and desloratadine. I am also having her start on Zoster Vaccine Adjuvanted. Additionally, I am having her maintain her aspirin, olopatadine, Multiple Vitamins-Minerals (CENTRUM SILVER PO), vitamin C, vitamin E, Polyethyl Glycol-Propyl Glycol (SYSTANE OP), Vitamin D-3, furosemide, bimatoprost, PROAIR HFA, EQL OMEGA 3 FISH OIL, ALPRAZolam, amLODipine, BREO ELLIPTA, pantoprazole, olmesartan, and loratadine.  Meds ordered this encounter  Medications  . Zoster Vaccine Adjuvanted [facility]) injection    Sig: Inject 0.5 mLs into the muscle once for 1 dose.  Dispense:  1 each    Refill:  1    Medications Discontinued During This Encounter  Medication Reason  . desloratadine (CLARINEX) 5 MG tablet Patient has not taken in last 30 days  . hydrocortisone (ANUSOL-HC) 25 MG suppository Patient has not taken in last 30 days    Follow-up: No follow-ups on file.   [name], MD

## 2017-11-30 DIAGNOSIS — Z85828 Personal history of other malignant neoplasm of skin: Secondary | ICD-10-CM | POA: Diagnosis not present

## 2017-11-30 DIAGNOSIS — L304 Erythema intertrigo: Secondary | ICD-10-CM | POA: Diagnosis not present

## 2017-11-30 DIAGNOSIS — D2271 Melanocytic nevi of right lower limb, including hip: Secondary | ICD-10-CM | POA: Diagnosis not present

## 2017-11-30 DIAGNOSIS — L718 Other rosacea: Secondary | ICD-10-CM | POA: Diagnosis not present

## 2017-11-30 DIAGNOSIS — D1800 Hemangioma unspecified site: Secondary | ICD-10-CM | POA: Diagnosis not present

## 2017-11-30 DIAGNOSIS — D2272 Melanocytic nevi of left lower limb, including hip: Secondary | ICD-10-CM | POA: Diagnosis not present

## 2017-11-30 DIAGNOSIS — L700 Acne vulgaris: Secondary | ICD-10-CM | POA: Diagnosis not present

## 2017-11-30 DIAGNOSIS — L821 Other seborrheic keratosis: Secondary | ICD-10-CM | POA: Diagnosis not present

## 2017-12-22 ENCOUNTER — Encounter: Payer: Self-pay | Admitting: Internal Medicine

## 2017-12-25 ENCOUNTER — Encounter: Payer: Self-pay | Admitting: Internal Medicine

## 2017-12-27 MED ORDER — ALBUTEROL SULFATE HFA 108 (90 BASE) MCG/ACT IN AERS: INHALATION_SPRAY | RESPIRATORY_TRACT | 3 refills | Status: DC

## 2017-12-30 DIAGNOSIS — R600 Localized edema: Secondary | ICD-10-CM | POA: Diagnosis not present

## 2017-12-30 DIAGNOSIS — C50411 Malignant neoplasm of upper-outer quadrant of right female breast: Secondary | ICD-10-CM | POA: Diagnosis not present

## 2017-12-30 DIAGNOSIS — Z885 Allergy status to narcotic agent status: Secondary | ICD-10-CM | POA: Diagnosis not present

## 2017-12-30 DIAGNOSIS — Z171 Estrogen receptor negative status [ER-]: Secondary | ICD-10-CM | POA: Diagnosis not present

## 2017-12-30 DIAGNOSIS — Z9221 Personal history of antineoplastic chemotherapy: Secondary | ICD-10-CM | POA: Diagnosis not present

## 2017-12-30 DIAGNOSIS — G629 Polyneuropathy, unspecified: Secondary | ICD-10-CM | POA: Diagnosis not present

## 2017-12-30 DIAGNOSIS — Z79899 Other long term (current) drug therapy: Secondary | ICD-10-CM | POA: Diagnosis not present

## 2017-12-30 DIAGNOSIS — Z923 Personal history of irradiation: Secondary | ICD-10-CM | POA: Diagnosis not present

## 2017-12-30 DIAGNOSIS — Z7982 Long term (current) use of aspirin: Secondary | ICD-10-CM | POA: Diagnosis not present

## 2018-01-13 ENCOUNTER — Telehealth: Payer: Self-pay | Admitting: *Deleted

## 2018-01-13 DIAGNOSIS — Z87891 Personal history of nicotine dependence: Secondary | ICD-10-CM

## 2018-01-13 DIAGNOSIS — Z122 Encounter for screening for malignant neoplasm of respiratory organs: Secondary | ICD-10-CM

## 2018-01-13 NOTE — Telephone Encounter (Signed)
Notified patient that annual lung cancer screening low dose CT scan is due currently or will be in near future. Confirmed that patient is within the age range of 55-77, and asymptomatic, (no signs or symptoms of lung cancer). Patient denies illness that would prevent curative treatment for lung cancer if found. Verified smoking history, (former, quit 2007, 50 pack year). The shared decision making visit was done 12/22/13. Patient is agreeable for CT scan being scheduled.

## 2018-01-15 ENCOUNTER — Other Ambulatory Visit: Payer: Self-pay | Admitting: Internal Medicine

## 2018-01-31 ENCOUNTER — Ambulatory Visit
Admission: RE | Admit: 2018-01-31 | Discharge: 2018-01-31 | Disposition: A | Discharge status: Home / Self Care | Payer: Medicare Other | Source: Ambulatory Visit | Attending: Nurse Practitioner | Admitting: Nurse Practitioner

## 2018-01-31 DIAGNOSIS — I2510 Atherosclerotic heart disease of native coronary artery without angina pectoris: Secondary | ICD-10-CM | POA: Insufficient documentation

## 2018-01-31 DIAGNOSIS — I700 Atherosclerosis of aorta: Secondary | ICD-10-CM | POA: Diagnosis not present

## 2018-01-31 DIAGNOSIS — J438 Other emphysema: Secondary | ICD-10-CM | POA: Diagnosis not present

## 2018-01-31 DIAGNOSIS — Z87891 Personal history of nicotine dependence: Secondary | ICD-10-CM | POA: Diagnosis not present

## 2018-01-31 DIAGNOSIS — Z122 Encounter for screening for malignant neoplasm of respiratory organs: Secondary | ICD-10-CM

## 2018-01-31 DIAGNOSIS — K760 Fatty (change of) liver, not elsewhere classified: Secondary | ICD-10-CM | POA: Diagnosis not present

## 2018-01-31 DIAGNOSIS — J432 Centrilobular emphysema: Secondary | ICD-10-CM | POA: Insufficient documentation

## 2018-02-03 ENCOUNTER — Encounter: Payer: Self-pay | Admitting: *Deleted

## 2018-02-23 DIAGNOSIS — H8112 Benign paroxysmal vertigo, left ear: Secondary | ICD-10-CM | POA: Diagnosis not present

## 2018-03-04 ENCOUNTER — Encounter: Payer: Self-pay | Admitting: Internal Medicine

## 2018-03-04 ENCOUNTER — Ambulatory Visit (INDEPENDENT_AMBULATORY_CARE_PROVIDER_SITE_OTHER): Payer: Medicare Other | Admitting: Internal Medicine

## 2018-03-04 VITALS — BP 130/88 | HR 76 | Temp 97.9°F | Resp 15 | Ht 62.0 in | Wt 181.6 lb

## 2018-03-04 DIAGNOSIS — Z789 Other specified health status: Secondary | ICD-10-CM

## 2018-03-04 DIAGNOSIS — J449 Chronic obstructive pulmonary disease, unspecified: Secondary | ICD-10-CM

## 2018-03-04 DIAGNOSIS — I2510 Atherosclerotic heart disease of native coronary artery without angina pectoris: Secondary | ICD-10-CM | POA: Diagnosis not present

## 2018-03-04 DIAGNOSIS — E119 Type 2 diabetes mellitus without complications: Secondary | ICD-10-CM

## 2018-03-04 DIAGNOSIS — Z23 Encounter for immunization: Secondary | ICD-10-CM | POA: Diagnosis not present

## 2018-03-04 DIAGNOSIS — G4733 Obstructive sleep apnea (adult) (pediatric): Secondary | ICD-10-CM

## 2018-03-04 LAB — COMPREHENSIVE METABOLIC PANEL
ALK PHOS: 80 U/L (ref 39–117)
ALT: 67 U/L — AB (ref 0–35)
AST: 56 U/L — AB (ref 0–37)
Albumin: 4.2 g/dL (ref 3.5–5.2)
BUN: 17 mg/dL (ref 6–23)
CALCIUM: 9.8 mg/dL (ref 8.4–10.5)
CHLORIDE: 103 meq/L (ref 96–112)
CO2: 28 mEq/L (ref 19–32)
Creatinine, Ser: 0.91 mg/dL (ref 0.40–1.20)
GFR: 64.54 mL/min (ref >60.00)
Glucose, Bld: 179 mg/dL — ABNORMAL HIGH (ref 70–99)
POTASSIUM: 4.5 meq/L (ref 3.5–5.1)
Sodium: 141 mEq/L (ref 135–145)
Total Bilirubin: 0.5 mg/dL (ref 0.2–1.2)
Total Protein: 7.2 g/dL (ref 6.0–8.3)

## 2018-03-04 LAB — HEMOGLOBIN A1C: Hgb A1c MFr Bld: 8.1 % — ABNORMAL HIGH (ref 4.6–6.5)

## 2018-03-04 LAB — LIPID PANEL
CHOLESTEROL: 281 mg/dL — AB (ref 0–200)
HDL: 49 mg/dL (ref >39.00)
LDL CALC: 194 mg/dL — AB (ref 0–99)
NonHDL: 231.84
TRIGLYCERIDES: 188 mg/dL — AB (ref 0.0–149.0)
Total CHOL/HDL Ratio: 6
VLDL: 37.6 mg/dL (ref 0.0–40.0)

## 2018-03-04 MED ORDER — FUROSEMIDE 20 MG PO TABS: ORAL_TABLET | ORAL | 3 refills | Status: DC

## 2018-03-04 NOTE — Patient Instructions (Addendum)
Check your pulse ox when you are walking around the house to see if it drops.  If it  Drops more than 4 pts,,  I recommend  seeing [place].  If it does not,  I recommend going to the gym and starting a reular exercise program using the recumbent bike    Send me readings in one month :  Fasting  (should be < 120) Post prandial  ( 2 hours after a meal,  should be < 160)

## 2018-03-04 NOTE — Progress Notes (Signed)
Subjective:  Patient ID: Traci Hall, female    DOB: 07-Sep-1945  Age: 72 y.o. MRN: [id number]  CC: The primary encounter diagnosis was Controlled type 2 diabetes mellitus without complication, unspecified whether long term insulin use ([place]). Diagnoses of Need for influenza vaccination, OSA (obstructive sleep apnea), Statin intolerance, and COPD were also pertinent to this visit.  HPI [facility] presents for 3 month follow up on diabetes.  Patient has no complaints today.  Patient is not following a low glycemic index diet. She is  taking all prescribed medications regularly without side effects.  Fasting sugars have been over 140 but under 170 most of the time and post prandials have been under 180 except on rare occasions. Patient is not exercising or  intentionally trying to lose weight .  Patient has had an eye exam in the last 12 months and checks feet regularly for signs of infection.  Patient does not walk barefoot outside,  And denies an numbness tingling or burning in feet. Patient is up to date on all recommended vaccinations.  Home bps  Have been < 140/80    COPD:  Recent CT scan reviewed,  Completed [facility] a few years ago with [facility].  Asymptomatic CAD and aortic atherosclerosis also noted on CT .  Sees Arida every 6 months.    using CPAP  Nightly   Usage report reviewed   Average 6 to 10 hours per night  < 1 event per hour   She reports being more short of breath  walking around the house .  Released by Philippa Chester,  Unless needed..  Using triple therapy with  Breo once daily and albuterol  2 puffs q am    Lab Results  Component Value Date   HGBA1C 8.1 (H) 03/04/2018   Has decided to start folllowing a low GI diet  Diet discussed in detail today  UNAble TO Exercise due to throbbing feet after 30 minutes of activity   Outpatient Medications Prior to Visit  Medication Sig Dispense Refill  . albuterol (PROAIR HFA) 108 (90 Base) MCG/ACT inhaler USE 2  INHALATIONS EVERY MORNING ONLY 25.5 g 3  . ALPRAZolam (XANAX) 0.25 MG tablet Take 1 tablet (0.25 mg total) by mouth at bedtime as needed for anxiety. 30 tablet 0  . aspirin 81 MG tablet Take 81 mg by mouth daily.    . bimatoprost (LUMIGAN) 0.03 % ophthalmic solution Place 1 drop into both eyes at bedtime.    Marland Kitchen BREO ELLIPTA 100-25 MCG/INH AEPB USE 1 INHALATION DAILY 360 each 2  . Cholecalciferol (VITAMIN D-3) 1000 UNITS CAPS Take 1 capsule by mouth daily.    Marland Kitchen loratadine (CLARITIN) 10 MG tablet Take 10 mg by mouth daily.    . Multiple Vitamins-Minerals (CENTRUM SILVER PO) Take by mouth. Take 1 am     . olmesartan (BENICAR) 40 MG tablet Take 1 tablet (40 mg total) by mouth daily. 90 tablet 2  . olopatadine (PATANOL) 0.1 % ophthalmic solution Place 1 drop into both eyes as needed.     . Omega-3 Fatty Acids (EQL OMEGA 3 FISH OIL) 1400 MG CAPS Take 1,400 mg by mouth daily.    . pantoprazole (PROTONIX) 40 MG tablet TAKE 1 TABLET TWICE A DAY 180 tablet 1  . Polyethyl Glycol-Propyl Glycol (SYSTANE OP) Apply to eye. 1-2 drops in each eye as needed.    . vitamin C (ASCORBIC ACID) 500 MG tablet Take 500 mg by mouth daily.    Marland Kitchen  vitamin E (VITAMIN E) 400 UNIT capsule Take 400 Units by mouth daily.    . furosemide (LASIX) 20 MG tablet take 1 tablet by mouth once daily if needed 30 tablet 3  . amLODipine (NORVASC) 5 MG tablet Take 1 tablet (5 mg total) by mouth daily. 90 tablet 3   No facility-administered medications prior to visit.     Review of Systems;  Patient denies headache, fevers, malaise, unintentional weight loss, skin rash, eye pain, sinus congestion and sinus pain, sore throat, dysphagia,  hemoptysis , cough,  wheezing, chest pain, palpitations, orthopnea, edema, abdominal pain, nausea, melena, diarrhea, constipation, flank pain, dysuria, hematuria, urinary  Frequency, nocturia, numbness, tingling, seizures,  Focal weakness, Loss of consciousness,  Tremor, insomnia, depression, anxiety, and  suicidal ideation.      Objective:  BP 130/88 (BP Location: Left Arm, Patient Position: Sitting, Cuff Size: Normal)   Pulse 76   Temp 97.9 F (36.6 C) (Oral)   Resp 15   Ht 5\' 2"  (1.575 m)   Wt 181 lb 9.6 oz (82.4 kg)   SpO2 98%   BMI 33.22 kg/m   BP Readings from Last 3 Encounters:  03/04/18 130/88  11/29/17 124/60  11/29/17 124/68    Wt Readings from Last 3 Encounters:  03/04/18 181 lb 9.6 oz (82.4 kg)  01/31/18 187 lb (84.8 kg)  11/29/17 187 lb (84.8 kg)    General appearance: alert, cooperative and appears stated age Ears: normal TM's and external ear canals both ears Throat: lips, mucosa, and tongue normal; teeth and gums normal Neck: no adenopathy, no carotid bruit, supple, symmetrical, trachea midline and thyroid not enlarged, symmetric, no tenderness/mass/nodules Back: symmetric, no curvature. ROM normal. No CVA tenderness. Lungs: clear to auscultation bilaterally Heart: regular rate and rhythm, S1, S2 normal, no murmur, click, rub or gallop Abdomen: soft, non-tender; bowel sounds normal; no masses,  no organomegaly Pulses: 2+ and symmetric Skin: Skin color, texture, turgor normal. No rashes or lesions Lymph nodes: Cervical, supraclavicular, and axillary nodes normal.  Lab Results  Component Value Date   HGBA1C 8.1 (H) 03/04/2018   HGBA1C 7.7 (H) 11/29/2017   HGBA1C 7.1 (H) 06/02/2017    Lab Results  Component Value Date   CREATININE 0.91 03/04/2018   CREATININE 0.80 11/29/2017   CREATININE 0.88 06/02/2017    Lab Results  Component Value Date   WBC 5.8 11/29/2017   HGB 12.7 11/29/2017   HCT 37.6 11/29/2017   PLT 210.0 11/29/2017   GLUCOSE 179 (H) 03/04/2018   CHOL 281 (H) 03/04/2018   TRIG 188.0 (H) 03/04/2018   HDL 49.00 03/04/2018   LDLDIRECT 182.0 11/29/2017   LDLCALC 194 (H) 03/04/2018   ALT 67 (H) 03/04/2018   AST 56 (H) 03/04/2018   NA 141 03/04/2018   K 4.5 03/04/2018   CL 103 03/04/2018   CREATININE 0.91 03/04/2018   BUN 17  03/04/2018   CO2 28 03/04/2018   TSH 3.14 11/29/2017   HGBA1C 8.1 (H) 03/04/2018   MICROALBUR <0.7 11/26/2016    Ct Chest Lung Cancer Screening Low Dose Wo Contrast  Result Date: 01/31/2018 CLINICAL DATA:  72 year old female former smoker (quit 12 years ago) with 50 pack-year history of smoking. Lung cancer screening examination. EXAM: CT CHEST WITHOUT CONTRAST LOW-DOSE FOR LUNG CANCER SCREENING TECHNIQUE: Multidetector CT imaging of the chest was performed following the standard protocol without IV contrast. COMPARISON:  Low-dose lung cancer screening chest CT 01/29/2017. FINDINGS: Cardiovascular: Heart size is normal. There is no significant pericardial  fluid, thickening or pericardial calcification. There is aortic atherosclerosis, as well as atherosclerosis of the great vessels of the mediastinum and the coronary arteries, including calcified atherosclerotic plaque in the left main and left anterior descending coronary arteries. Calcifications of the mitral annulus and mitral valve. Mediastinum/Nodes: No pathologically enlarged mediastinal or hilar lymph nodes. Please note that accurate exclusion of hilar adenopathy is limited on noncontrast CT scans. Esophagus is unremarkable in appearance. No axillary lymphadenopathy. Surgical clips in the right axilla, presumably from prior lymph node dissection. Lungs/Pleura: When compared to the prior examination, the evolving postradiation changes in the subpleural aspect of the anterior aspect of the right upper and middle lobes continue to evolve and have become far more fibrotic and contracted. Multiple tiny pulmonary nodules are scattered throughout both lungs, largest of which has a volume derived mean diameter of only 3.9 mm near the apex of the left upper lobe (axial image 40 of series 3). No other larger more suspicious appearing pulmonary nodules or masses are noted. No acute consolidative airspace disease. No pleural effusions. Diffuse bronchial wall  thickening with mild centrilobular and paraseptal emphysema. Upper Abdomen: Mild diffuse low attenuation throughout the visualized portions of the hepatic parenchyma, indicative of a background of hepatic steatosis. Aortic atherosclerosis. Musculoskeletal: There are no aggressive appearing lytic or blastic lesions noted in the visualized portions of the skeleton. IMPRESSION: 1. Lung-RADS 2S, benign appearance or behavior. Continue annual screening with low-dose chest CT without contrast in 12 months. 2. The "S" modifier above refers to potentially clinically significant non lung cancer related findings. Specifically, there is aortic atherosclerosis, in addition to left main and left anterior descending coronary artery disease. Assessment for potential risk factor modification, dietary therapy or pharmacologic therapy may be warranted, if clinically indicated. 3. Mild diffuse bronchial wall thickening with mild centrilobular and paraseptal emphysema; imaging findings suggestive of underlying COPD. 4. There are calcifications of the mitral valve and annulus. Echocardiographic correlation for evaluation of potential valvular dysfunction may be warranted if clinically indicated. 5. Hepatic steatosis. Aortic Atherosclerosis (ICD10-I70.0) and Emphysema (ICD10-J43.9). Electronically Signed   By: Vinnie Langton M.D.   On: 01/31/2018 13:14    Assessment & Plan:   Problem List Items Addressed This Visit    COPD    She is dyspneic walking around the house. She is on optimal therapy. Encouraged to begin a walking program.  Advised to use her home pulse oximetry to assess her oxygen saturations at home       DM II (diabetes mellitus, type II), controlled ([place]) - Primary    Loss of control due to dietary non adherence Diet controlled since 2014 with normoglycemia until recent treatment with steroids for COPD exacerbation.  Will start metformin 500 mg bid and reassess BS in a gewweeks.   Lab Results  Component  Value Date   HGBA1C 8.1 (H) 03/04/2018   Lab Results  Component Value Date   MICROALBUR <0.7 11/26/2016         Relevant Medications   metFORMIN (GLUCOPHAGE) 500 MG tablet   Other Relevant Orders   Hemoglobin A1c (Completed)   Comprehensive metabolic panel (Completed)   Lipid panel (Completed)   OSA (obstructive sleep apnea)    Diagnosed by sleep study. She is wearing her CPAP every night a minimum of 6 hours per night and notes improved daytime wakefulness and decreased fatigue       Statin intolerance    Patient would be a good candidate for Repatha given known CAD and  documented statin intolerance. Will discuss with patient        Other Visit Diagnoses    Need for influenza vaccination       Relevant Orders   Flu vaccine HIGH DOSE PF (Fluzone High dose) (Completed)      I am having [facility] start on metFORMIN. I am also having her maintain her aspirin, olopatadine, Multiple Vitamins-Minerals (CENTRUM SILVER PO), vitamin C, vitamin E, Polyethyl Glycol-Propyl Glycol (SYSTANE OP), Vitamin D-3, bimatoprost, EQL OMEGA 3 FISH OIL, ALPRAZolam, amLODipine, BREO ELLIPTA, olmesartan, loratadine, albuterol, pantoprazole, and furosemide.  Meds ordered this encounter  Medications  . furosemide (LASIX) 20 MG tablet    Sig: take 1 tablet by mouth once daily if needed    Dispense:  30 tablet    Refill:  3  . metFORMIN (GLUCOPHAGE) 500 MG tablet    Sig: Take 1 tablet (500 mg total) by mouth 2 (two) times daily with a meal.    Dispense:  180 tablet    Refill:  3    Medications Discontinued During This Encounter  Medication Reason  . furosemide (LASIX) 20 MG tablet Reorder    Follow-up: Return in about 3 months (around 06/03/2018) for follow up diabetes.   [name], MD

## 2018-03-06 MED ORDER — METFORMIN HCL 500 MG PO TABS: 500.0000 mg | ORAL_TABLET | Freq: Two times a day (BID) | ORAL | 3 refills | Status: DC

## 2018-03-06 NOTE — Assessment & Plan Note (Signed)
She has asymptomatic atherosclerosis noted during imaging  Of chest/lungs.   EF 55-60% by August 2018 ECHO.  Statin intolerant.   LDL has risen .  Will discuss trial of Repatha if covered by insurance .  Lab Results  Component Value Date   CHOL 281 (H) 03/04/2018   HDL 49.00 03/04/2018   LDLCALC 194 (H) 03/04/2018   LDLDIRECT 182.0 11/29/2017   TRIG 188.0 (H) 03/04/2018   CHOLHDL 6 03/04/2018

## 2018-03-06 NOTE — Assessment & Plan Note (Signed)
Patient would be a good candidate for Repatha given known CAD and documented statin intolerance. Will discuss with patient

## 2018-03-06 NOTE — Assessment & Plan Note (Signed)
Diagnosed by sleep study. She is wearing her CPAP every night a minimum of 6 hours per night and notes improved daytime wakefulness and decreased fatigue  

## 2018-03-06 NOTE — Assessment & Plan Note (Addendum)
Loss of control due to dietary non adherence Diet controlled since 2014 with normoglycemia until recent treatment with steroids for COPD exacerbation.  Will start metformin 500 mg bid and reassess BS in a gewweeks.   Lab Results  Component Value Date   HGBA1C 8.1 (H) 03/04/2018   Lab Results  Component Value Date   MICROALBUR <0.7 11/26/2016

## 2018-03-06 NOTE — Assessment & Plan Note (Addendum)
She is dyspneic walking around the house. She is on optimal therapy. Encouraged to begin a walking program.  Advised to use her home pulse oximetry to assess her oxygen saturations at home

## 2018-03-09 ENCOUNTER — Telehealth: Payer: Self-pay | Admitting: Internal Medicine

## 2018-03-09 NOTE — Telephone Encounter (Signed)
Spoke with Threasa Beards, pharmacist at [facility] who states that the pt has prescription for Metformin and Furosemide available for pick up. Pt called an notified that medication was ready at the pharmacy. Pt verbalized understanding.

## 2018-03-09 NOTE — Telephone Encounter (Signed)
Copied from [place]. Topic: Quick Communication - See Telephone Encounter >> Mar 09, 2018  3:36 PM Traci Hall, Traci Hall wrote: Pt is needing a refill on her metformin sent to [facility] in [place] number [phone]

## 2018-03-15 DIAGNOSIS — C50411 Malignant neoplasm of upper-outer quadrant of right female breast: Secondary | ICD-10-CM | POA: Diagnosis not present

## 2018-03-15 DIAGNOSIS — Z171 Estrogen receptor negative status [ER-]: Secondary | ICD-10-CM | POA: Diagnosis not present

## 2018-04-12 DIAGNOSIS — G4733 Obstructive sleep apnea (adult) (pediatric): Secondary | ICD-10-CM | POA: Diagnosis not present

## 2018-04-13 ENCOUNTER — Ambulatory Visit (INDEPENDENT_AMBULATORY_CARE_PROVIDER_SITE_OTHER): Payer: Medicare Other | Admitting: Internal Medicine

## 2018-04-13 ENCOUNTER — Encounter: Payer: Self-pay | Admitting: Internal Medicine

## 2018-04-13 VITALS — BP 122/70 | HR 85 | Temp 98.4°F | Resp 15 | Ht 62.0 in | Wt 180.8 lb

## 2018-04-13 DIAGNOSIS — I10 Essential (primary) hypertension: Secondary | ICD-10-CM | POA: Diagnosis not present

## 2018-04-13 DIAGNOSIS — E1165 Type 2 diabetes mellitus with hyperglycemia: Secondary | ICD-10-CM | POA: Diagnosis not present

## 2018-04-13 DIAGNOSIS — I2510 Atherosclerotic heart disease of native coronary artery without angina pectoris: Secondary | ICD-10-CM

## 2018-04-13 DIAGNOSIS — J449 Chronic obstructive pulmonary disease, unspecified: Secondary | ICD-10-CM

## 2018-04-13 DIAGNOSIS — E7800 Pure hypercholesterolemia, unspecified: Secondary | ICD-10-CM | POA: Diagnosis not present

## 2018-04-13 NOTE — Patient Instructions (Addendum)
Increase metformin to 1000 mg twice daily   Try Limit total carbs per dinner meal to 50 or less,  30 or less for lunch and breakfast   Check sugars 2 hours after meals  And send me readings   Return on or after  Dec 21  For your next visit  Try the SOLA low carb bread ([employer]  Frozen bread )  3 g/slice  Tastes great!  MAKE YOUR OWN FRITTATAS!  CEREAL IS NOT YOUR FRIEND

## 2018-04-13 NOTE — Progress Notes (Signed)
Subjective:  Patient ID: Traci Hall, female    DOB: 21-Jun-1945  Age: 72 y.o. MRN: [id number]  CC: The primary encounter diagnosis was Uncontrolled type 2 diabetes mellitus with hyperglycemia ([place]). Diagnoses of Pure hypercholesterolemia, Essential hypertension, and COPD were also pertinent to this visit.  HPI [facility] presents for follow up on uncontrolled DM.    Patient is following a low glycemic index diet about 25% of the time and taking all metformin 500 mg twice daily without side effects.  She has been keeping a daily food diary for the past 5 weeks and .  Breakfasts have been ranging  from 25 to 85 carbs, and ut she has been limiting her daily net carbohydrate intake to < 200.  Fasting sugars have been  less than 150 most of the time and post prandials have been under 160 except on rare occasions. Patient is not exercising yet on a regular basis  .  Patient has had an eye exam in the last 12 months and checks feet regularly for signs of infection.  Patient does not walk barefoot outside,  And denies an numbness tingling or burning in feet. Patient is up to date on all recommended vaccinations    Outpatient Medications Prior to Visit  Medication Sig Dispense Refill  . albuterol (PROAIR HFA) 108 (90 Base) MCG/ACT inhaler USE 2 INHALATIONS EVERY MORNING ONLY 25.5 g 3  . ALPRAZolam (XANAX) 0.25 MG tablet Take 1 tablet (0.25 mg total) by mouth at bedtime as needed for anxiety. 30 tablet 0  . aspirin 81 MG tablet Take 81 mg by mouth daily.    . bimatoprost (LUMIGAN) 0.03 % ophthalmic solution Place 1 drop into both eyes at bedtime.    Marland Kitchen BREO ELLIPTA 100-25 MCG/INH AEPB USE 1 INHALATION DAILY 360 each 2  . Cholecalciferol (VITAMIN D-3) 1000 UNITS CAPS Take 1 capsule by mouth daily.    . furosemide (LASIX) 20 MG tablet take 1 tablet by mouth once daily if needed 30 tablet 3  . Lactobacillus (PROBIOTIC ACIDOPHILUS PO) Take 1 capsule by mouth daily.    Marland Kitchen loratadine (CLARITIN) 10 MG  tablet Take 10 mg by mouth daily.    . Multiple Vitamins-Minerals (CENTRUM SILVER PO) Take by mouth. Take 1 am     . olmesartan (BENICAR) 40 MG tablet Take 1 tablet (40 mg total) by mouth daily. 90 tablet 2  . olopatadine (PATANOL) 0.1 % ophthalmic solution Place 1 drop into both eyes as needed.     . Omega-3 Fatty Acids (EQL OMEGA 3 FISH OIL) 1400 MG CAPS Take 1,400 mg by mouth daily.    . pantoprazole (PROTONIX) 40 MG tablet TAKE 1 TABLET TWICE A DAY 180 tablet 1  . Polyethyl Glycol-Propyl Glycol (SYSTANE OP) Apply to eye. 1-2 drops in each eye as needed.    . vitamin C (ASCORBIC ACID) 500 MG tablet Take 500 mg by mouth daily.    . vitamin E (VITAMIN E) 400 UNIT capsule Take 400 Units by mouth daily.    . metFORMIN (GLUCOPHAGE) 500 MG tablet Take 1 tablet (500 mg total) by mouth 2 (two) times daily with a meal. 180 tablet 3  . amLODipine (NORVASC) 5 MG tablet Take 1 tablet (5 mg total) by mouth daily. 90 tablet 3   No facility-administered medications prior to visit.     Review of Systems;  Patient denies headache, fevers, malaise, unintentional weight loss, skin rash, eye pain, sinus congestion and sinus pain, sore throat,  dysphagia,  hemoptysis , cough, dyspnea, wheezing, chest pain, palpitations, orthopnea, edema, abdominal pain, nausea, melena, diarrhea, constipation, flank pain, dysuria, hematuria, urinary  Frequency, nocturia, numbness, tingling, seizures,  Focal weakness, Loss of consciousness,  Tremor, insomnia, depression, anxiety, and suicidal ideation.      Objective:  BP 122/70 (BP Location: Left Arm, Patient Position: Sitting, Cuff Size: Large)   Pulse 85   Temp 98.4 F (36.9 C) (Oral)   Resp 15   Ht 5\' 2"  (1.575 m)   Wt 180 lb 12.8 oz (82 kg)   SpO2 97%   BMI 33.07 kg/m   BP Readings from Last 3 Encounters:  04/13/18 122/70  03/04/18 130/88  11/29/17 124/60    Wt Readings from Last 3 Encounters:  04/13/18 180 lb 12.8 oz (82 kg)  03/04/18 181 lb 9.6 oz (82.4  kg)  01/31/18 187 lb (84.8 kg)    General appearance: alert, cooperative and appears stated age Neck: no adenopathy, no carotid bruit, supple, symmetrical, trachea midline and thyroid not enlarged, symmetric, no tenderness/mass/nodules Back: symmetric, no curvature. ROM normal. No CVA tenderness. Lungs: clear to auscultation bilaterally Heart: regular rate and rhythm, S1, S2 normal, no murmur, click, rub or gallop Abdomen: soft, non-tender; bowel sounds normal; no masses,  no organomegaly Pulses: 2+ and symmetric Skin: Skin color, texture, turgor normal. No rashes or lesions Lymph nodes: Cervical, supraclavicular, and axillary nodes normal.  Lab Results  Component Value Date   HGBA1C 8.1 (H) 03/04/2018   HGBA1C 7.7 (H) 11/29/2017   HGBA1C 7.1 (H) 06/02/2017    Lab Results  Component Value Date   CREATININE 0.91 03/04/2018   CREATININE 0.80 11/29/2017   CREATININE 0.88 06/02/2017    Lab Results  Component Value Date   WBC 5.8 11/29/2017   HGB 12.7 11/29/2017   HCT 37.6 11/29/2017   PLT 210.0 11/29/2017   GLUCOSE 179 (H) 03/04/2018   CHOL 281 (H) 03/04/2018   TRIG 188.0 (H) 03/04/2018   HDL 49.00 03/04/2018   LDLDIRECT 182.0 11/29/2017   LDLCALC 194 (H) 03/04/2018   ALT 67 (H) 03/04/2018   AST 56 (H) 03/04/2018   NA 141 03/04/2018   K 4.5 03/04/2018   CL 103 03/04/2018   CREATININE 0.91 03/04/2018   BUN 17 03/04/2018   CO2 28 03/04/2018   TSH 3.14 11/29/2017   HGBA1C 8.1 (H) 03/04/2018   MICROALBUR <0.7 11/26/2016    Ct Chest Lung Cancer Screening Low Dose Wo Contrast  Result Date: 01/31/2018 CLINICAL DATA:  72 year old female former smoker (quit 12 years ago) with 50 pack-year history of smoking. Lung cancer screening examination. EXAM: CT CHEST WITHOUT CONTRAST LOW-DOSE FOR LUNG CANCER SCREENING TECHNIQUE: Multidetector CT imaging of the chest was performed following the standard protocol without IV contrast. COMPARISON:  Low-dose lung cancer screening chest CT  01/29/2017. FINDINGS: Cardiovascular: Heart size is normal. There is no significant pericardial fluid, thickening or pericardial calcification. There is aortic atherosclerosis, as well as atherosclerosis of the great vessels of the mediastinum and the coronary arteries, including calcified atherosclerotic plaque in the left main and left anterior descending coronary arteries. Calcifications of the mitral annulus and mitral valve. Mediastinum/Nodes: No pathologically enlarged mediastinal or hilar lymph nodes. Please note that accurate exclusion of hilar adenopathy is limited on noncontrast CT scans. Esophagus is unremarkable in appearance. No axillary lymphadenopathy. Surgical clips in the right axilla, presumably from prior lymph node dissection. Lungs/Pleura: When compared to the prior examination, the evolving postradiation changes in the subpleural aspect of  the anterior aspect of the right upper and middle lobes continue to evolve and have become far more fibrotic and contracted. Multiple tiny pulmonary nodules are scattered throughout both lungs, largest of which has a volume derived mean diameter of only 3.9 mm near the apex of the left upper lobe (axial image 40 of series 3). No other larger more suspicious appearing pulmonary nodules or masses are noted. No acute consolidative airspace disease. No pleural effusions. Diffuse bronchial wall thickening with mild centrilobular and paraseptal emphysema. Upper Abdomen: Mild diffuse low attenuation throughout the visualized portions of the hepatic parenchyma, indicative of a background of hepatic steatosis. Aortic atherosclerosis. Musculoskeletal: There are no aggressive appearing lytic or blastic lesions noted in the visualized portions of the skeleton. IMPRESSION: 1. Lung-RADS 2S, benign appearance or behavior. Continue annual screening with low-dose chest CT without contrast in 12 months. 2. The "S" modifier above refers to potentially clinically significant non  lung cancer related findings. Specifically, there is aortic atherosclerosis, in addition to left main and left anterior descending coronary artery disease. Assessment for potential risk factor modification, dietary therapy or pharmacologic therapy may be warranted, if clinically indicated. 3. Mild diffuse bronchial wall thickening with mild centrilobular and paraseptal emphysema; imaging findings suggestive of underlying COPD. 4. There are calcifications of the mitral valve and annulus. Echocardiographic correlation for evaluation of potential valvular dysfunction may be warranted if clinically indicated. 5. Hepatic steatosis. Aortic Atherosclerosis (ICD10-I70.0) and Emphysema (ICD10-J43.9). Electronically Signed   By: Vinnie Langton M.D.   On: 01/31/2018 13:14    Assessment & Plan:   Problem List Items Addressed This Visit    COPD    Mild by 2018 PFTS,  Asymptomatic at rest..  Complicated by evolving post radiation  fibrotic changes  To the right upper and middle  lobes from treatment of breast cancer. She has not had PFTS since 2015. She has been vaccinated and is using Breo for maintenance as advised by [employer] .      DM (diabetes mellitus), type 2, uncontrolled ([place]) - Primary    Diet and BS reviewed in detail.  Advised to reduce carbohydrate intake by 25% (from 200 daily to 150) ad increase metformin to 1000 mg bid.    Lab Results  Component Value Date   HGBA1C 8.1 (H) 03/04/2018   Lab Results  Component Value Date   MICROALBUR <0.7 11/26/2016         Relevant Medications   metFORMIN (GLUCOPHAGE) 1000 MG tablet   Other Relevant Orders   Microalbumin / creatinine urine ratio   Hemoglobin A1c   Comprehensive metabolic panel   Essential hypertension    Well controlled on current regimen. Renal function stable, no changes today.  Lab Results  Component Value Date   CREATININE 0.91 03/04/2018   Lab Results  Component Value Date   NA 141 03/04/2018   K 4.5 03/04/2018   CL  103 03/04/2018   CO2 28 03/04/2018         Hyperlipidemia    Untreated due to statin intolerance..  Goal is LDL , 70 givne left main and LAD disease.  repatha advised and PA is in progress.  . Lab Results  Component Value Date   CHOL 281 (H) 03/04/2018   HDL 49.00 03/04/2018   LDLCALC 194 (H) 03/04/2018   LDLDIRECT 182.0 11/29/2017   TRIG 188.0 (H) 03/04/2018   CHOLHDL 6 03/04/2018          Relevant Orders   Lipid panel  A total of 25 minutes of face to face time was spent with patient more than half of which was spent in counselling about the above mentioned conditions  and coordination of care  I have changed Naleigha Raimondi Mountain's metFORMIN. I am also having her maintain her aspirin, olopatadine, Multiple Vitamins-Minerals (CENTRUM SILVER PO), vitamin C, vitamin E, Polyethyl Glycol-Propyl Glycol (SYSTANE OP), Vitamin D-3, bimatoprost, EQL OMEGA 3 FISH OIL, ALPRAZolam, amLODipine, BREO ELLIPTA, olmesartan, loratadine, albuterol, pantoprazole, furosemide, and Lactobacillus (PROBIOTIC ACIDOPHILUS PO).  Meds ordered this encounter  Medications  . metFORMIN (GLUCOPHAGE) 1000 MG tablet    Sig: Take 0.5 tablets (500 mg total) by mouth 2 (two) times daily with a meal.    Medications Discontinued During This Encounter  Medication Reason  . metFORMIN (GLUCOPHAGE) 500 MG tablet     Follow-up: Return in about 7 weeks (around 06/04/2018), or after , for follow up diabetes.   [name], MD

## 2018-04-14 ENCOUNTER — Telehealth: Payer: Self-pay | Admitting: Internal Medicine

## 2018-04-14 MED ORDER — METFORMIN HCL 1000 MG PO TABS: 500.0000 mg | ORAL_TABLET | Freq: Two times a day (BID) | ORAL | Status: DC

## 2018-04-14 NOTE — Assessment & Plan Note (Addendum)
Untreated due to statin intolerance..  Goal is LDL , 70 givne left main and LAD disease.  repatha advised and PA is in progress.  . Lab Results  Component Value Date   CHOL 281 (H) 03/04/2018   HDL 49.00 03/04/2018   LDLCALC 194 (H) 03/04/2018   LDLDIRECT 182.0 11/29/2017   TRIG 188.0 (H) 03/04/2018   CHOLHDL 6 03/04/2018

## 2018-04-14 NOTE — Assessment & Plan Note (Signed)
Diet and BS reviewed in detail.  Advised to reduce carbohydrate intake by 25% (from 200 daily to 150) ad increase metformin to 1000 mg bid.    Lab Results  Component Value Date   HGBA1C 8.1 (H) 03/04/2018   Lab Results  Component Value Date   MICROALBUR <0.7 11/26/2016

## 2018-04-14 NOTE — Assessment & Plan Note (Signed)
Involving the LAD and left main, noted on lung cancer screening CT.  She is statin intolerant.  PA for Repatha is in progress.

## 2018-04-14 NOTE — Assessment & Plan Note (Addendum)
Mild by 2018 PFTS,  Asymptomatic at rest..  Complicated by evolving post radiation  fibrotic changes  To the right upper and middle  lobes from treatment of breast cancer. She has not had PFTS since 2015. She has been vaccinated and is using Breo for maintenance as advised by [employer] .

## 2018-04-14 NOTE — Assessment & Plan Note (Signed)
Well controlled on current regimen. Renal function stable, no changes today.  Lab Results  Component Value Date   CREATININE 0.91 03/04/2018   Lab Results  Component Value Date   NA 141 03/04/2018   K 4.5 03/04/2018   CL 103 03/04/2018   CO2 28 03/04/2018

## 2018-04-14 NOTE — Telephone Encounter (Signed)
Please start the PA process for Repatha. Patient has CAD and is statin intolerant. Let patient know the process has started.

## 2018-04-27 MED ORDER — EVOLOCUMAB 140 MG/ML ~~LOC~~ SOSY
1.0000 mL | PREFILLED_SYRINGE | SUBCUTANEOUS | 11 refills | Status: DC
Start: 2018-04-27 — End: 2018-06-14

## 2018-04-27 NOTE — Telephone Encounter (Signed)
Done

## 2018-04-27 NOTE — Telephone Encounter (Signed)
Can the medication be added to the medication list so that I can have the correct dose and directions of use for the PA?

## 2018-05-10 ENCOUNTER — Other Ambulatory Visit: Payer: Self-pay | Admitting: Internal Medicine

## 2018-05-10 DIAGNOSIS — E1165 Type 2 diabetes mellitus with hyperglycemia: Secondary | ICD-10-CM

## 2018-05-10 MED ORDER — FREESTYLE LANCETS MISC
3 refills | Status: DC
Start: 2018-05-10 — End: 2020-03-26

## 2018-05-10 MED ORDER — GLUCOSE BLOOD VI STRP: 1.0000 | ORAL_STRIP | Freq: Two times a day (BID) | 3 refills | Status: DC

## 2018-05-10 MED ORDER — METFORMIN HCL 1000 MG PO TABS
1000.0000 mg | ORAL_TABLET | Freq: Two times a day (BID) | ORAL | 3 refills | Status: DC
Start: 2018-05-10 — End: 2018-06-27

## 2018-05-11 ENCOUNTER — Other Ambulatory Visit: Payer: Self-pay | Admitting: Cardiovascular Disease

## 2018-05-11 ENCOUNTER — Other Ambulatory Visit: Payer: Self-pay | Admitting: Internal Medicine

## 2018-05-16 ENCOUNTER — Other Ambulatory Visit: Payer: Self-pay | Admitting: Internal Medicine

## 2018-05-16 NOTE — Telephone Encounter (Signed)
Pt states that the refill need to be for 90 days and the test strips in freestyle lite

## 2018-05-16 NOTE — Telephone Encounter (Signed)
Copied from [place] [id number]. Topic: Quick Communication - Rx Refill/Question >> May 16, 2018  9:51 AM Celedonio Savage L wrote: Medication: amLODipine (NORVASC) 5 MG tablet (Expired)  glucose blood test strip the pharmacy states that the provider need more documentation on this refill  Has the patient contacted their pharmacy? Yes.  today (Agent: If no, request that the patient contact the pharmacy for the refill.) (Agent: If yes, when and what did the pharmacy advise?) was told to call provider   Preferred Pharmacy (with phone number or street name): [place], [place] [phone] (Phone) [phone] (Fax)    Agent: Please be advised that RX refills may take up to 3 business days. We ask that you follow-up with your pharmacy.

## 2018-05-17 ENCOUNTER — Other Ambulatory Visit: Payer: Self-pay

## 2018-05-17 MED ORDER — AMLODIPINE BESYLATE 5 MG PO TABS: 5.0000 mg | ORAL_TABLET | Freq: Every day | ORAL | 3 refills | Status: DC

## 2018-05-17 MED ORDER — GLUCOSE BLOOD VI STRP: 1.0000 | ORAL_STRIP | Freq: Two times a day (BID) | 3 refills | Status: DC

## 2018-05-17 MED ORDER — GLUCOSE BLOOD VI STRP: ORAL_STRIP | 3 refills | Status: DC

## 2018-05-17 NOTE — Telephone Encounter (Signed)
Requested Prescriptions  Pending Prescriptions Disp Refills  . amLODipine (NORVASC) 5 MG tablet 90 tablet 3    Sig: Take 1 tablet (5 mg total) by mouth daily.     Cardiovascular:  Calcium Channel Blockers Passed - 05/16/2018  2:18 PM      Passed - Last BP in normal range    BP Readings from Last 1 Encounters:  04/13/18 122/70         Passed - Valid encounter within last 6 months    Recent Outpatient Visits          1 month ago Uncontrolled type 2 diabetes mellitus with hyperglycemia ([place])   [place] [name], MD   2 months ago Controlled type 2 diabetes mellitus without complication, unspecified whether long term insulin use ([place])   [place] [name], MD   5 months ago Anemia, unspecified type   [place] [name], MD   11 months ago Pure hypercholesterolemia   [place] [name], MD   1 year ago Controlled type 2 diabetes mellitus without complication, unspecified whether long term insulin use ([place])   [place], [place], MD      Future Appointments            In 3 weeks Derrel Nip, Aris Everts, MD [place], [place]   In 6 months O'Brien-Blaney, Denisa L, LPN; [name], MD [facility], [place]         . glucose blood test strip 180 each 3    Sig: 1 each by Other route 2 (two) times daily. Type 2 DM uncontrolled     Endocrinology: Diabetes - Testing Supplies Passed - 05/16/2018  2:18 PM      Passed - Valid encounter within last 12 months    Recent Outpatient Visits          1 month ago Uncontrolled type 2 diabetes mellitus with hyperglycemia ([place])   [place] [name], MD   2 months ago Controlled type 2 diabetes mellitus without complication, unspecified whether long term insulin use ([place])   [place] [name], MD   5 months ago Anemia,  unspecified type   [place] [name], MD   11 months ago Pure hypercholesterolemia   [place] [name], MD   1 year ago Controlled type 2 diabetes mellitus without complication, unspecified whether long term insulin use ([place])   [place], [place], MD      Future Appointments            In 3 weeks Derrel Nip, Aris Everts, MD [place], [place]   In 6 months O'Brien-Blaney, Denisa L, LPN; [name], MD [facility], [facility]

## 2018-05-27 DIAGNOSIS — H401131 Primary open-angle glaucoma, bilateral, mild stage: Secondary | ICD-10-CM | POA: Diagnosis not present

## 2018-05-30 ENCOUNTER — Ambulatory Visit: Payer: Medicare Other | Admitting: Internal Medicine

## 2018-06-13 ENCOUNTER — Encounter: Payer: Self-pay | Admitting: Internal Medicine

## 2018-06-13 ENCOUNTER — Ambulatory Visit (INDEPENDENT_AMBULATORY_CARE_PROVIDER_SITE_OTHER): Payer: Medicare Other | Admitting: Internal Medicine

## 2018-06-13 VITALS — BP 130/76 | HR 77 | Temp 98.2°F | Resp 15 | Ht 62.0 in | Wt 177.2 lb

## 2018-06-13 DIAGNOSIS — Z9189 Other specified personal risk factors, not elsewhere classified: Secondary | ICD-10-CM

## 2018-06-13 DIAGNOSIS — I499 Cardiac arrhythmia, unspecified: Secondary | ICD-10-CM

## 2018-06-13 DIAGNOSIS — I2510 Atherosclerotic heart disease of native coronary artery without angina pectoris: Secondary | ICD-10-CM

## 2018-06-13 DIAGNOSIS — E7800 Pure hypercholesterolemia, unspecified: Secondary | ICD-10-CM | POA: Diagnosis not present

## 2018-06-13 DIAGNOSIS — E1165 Type 2 diabetes mellitus with hyperglycemia: Secondary | ICD-10-CM | POA: Diagnosis not present

## 2018-06-13 LAB — HEMOGLOBIN A1C: HEMOGLOBIN A1C: 6.3 % (ref 4.6–6.5)

## 2018-06-13 LAB — COMPREHENSIVE METABOLIC PANEL
ALBUMIN: 4.4 g/dL (ref 3.5–5.2)
ALT: 37 U/L — ABNORMAL HIGH (ref 0–35)
AST: 28 U/L (ref 0–37)
Alkaline Phosphatase: 64 U/L (ref 39–117)
BILIRUBIN TOTAL: 0.5 mg/dL (ref 0.2–1.2)
BUN: 15 mg/dL (ref 6–23)
CALCIUM: 9.8 mg/dL (ref 8.4–10.5)
CHLORIDE: 101 meq/L (ref 96–112)
CO2: 28 meq/L (ref 19–32)
Creatinine, Ser: 0.86 mg/dL (ref 0.40–1.20)
GFR: 68.83 mL/min (ref >60.00)
Glucose, Bld: 106 mg/dL — ABNORMAL HIGH (ref 70–99)
Potassium: 4.1 mEq/L (ref 3.5–5.1)
Sodium: 139 mEq/L (ref 135–145)
Total Protein: 7.1 g/dL (ref 6.0–8.3)

## 2018-06-13 LAB — LIPID PANEL
Cholesterol: 323 mg/dL — ABNORMAL HIGH (ref 0–200)
HDL: 53.4 mg/dL (ref >39.00)
NonHDL: 269.81
TRIGLYCERIDES: 207 mg/dL — AB (ref 0.0–149.0)
Total CHOL/HDL Ratio: 6
VLDL: 41.4 mg/dL — ABNORMAL HIGH (ref 0.0–40.0)

## 2018-06-13 LAB — MAGNESIUM: Magnesium: 1.6 mg/dL (ref 1.5–2.5)

## 2018-06-13 LAB — TSH: TSH: 4.36 u[IU]/mL (ref 0.35–4.50)

## 2018-06-13 LAB — MICROALBUMIN / CREATININE URINE RATIO
CREATININE, U: 33.4 mg/dL
MICROALB/CREAT RATIO: 2.1 mg/g (ref 0.0–30.0)

## 2018-06-13 LAB — LDL CHOLESTEROL, DIRECT: LDL DIRECT: 240 mg/dL

## 2018-06-13 NOTE — Progress Notes (Signed)
Subjective:  Patient ID: Traci Hall, female    DOB: 1946-05-11  Age: 72 y.o. MRN: [id number]  CC: The primary encounter diagnosis was Uncontrolled type 2 diabetes mellitus with hyperglycemia ([place]). Diagnoses of Cardiac arrhythmia, unspecified cardiac arrhythmia type, Pure hypercholesterolemia, and At risk for arrhythmia were also pertinent to this visit.  HPI Traci Hall presents for follow up on uncontrolled diabetes and hyperlipidemia secondary to statin intolerance  .  Last a1c was 8.1 on Sept 20. Since then she has been very careful with her diet and taking blood sugar readings twice daily .  She is taking her medications as directed and has brought her readings for review.  Post prandials are all < 160.  She has been checking BP daily as well and her home monitor has continued to note  an irregular heart beat several days out of each week.  She has no symptoms of irregular heart beat .  Her next cardiology appointment is not until February 2020 with Dr Fletcher Anon and at her last appointment  her arrhythmia was dicussed and tentative workup was planned.  Discussed getting a  30 day Cardionet set up   Hyperlipidemia:  She has had multiple drug trials and is Statin and zetia intolerant but willling to try Repatha,  Questions  addressed  And fasting lipids drawn today.     Outpatient Medications Prior to Visit  Medication Sig Dispense Refill  . albuterol (PROAIR HFA) 108 (90 Base) MCG/ACT inhaler USE 2 INHALATIONS EVERY MORNING ONLY 25.5 g 3  . ALPRAZolam (XANAX) 0.25 MG tablet Take 1 tablet (0.25 mg total) by mouth at bedtime as needed for anxiety. 30 tablet 0  . amLODipine (NORVASC) 5 MG tablet Take 1 tablet (5 mg total) by mouth daily. 90 tablet 3  . aspirin 81 MG tablet Take 81 mg by mouth daily.    Marland Kitchen BENICAR 40 MG tablet TAKE 1 TABLET DAILY 90 tablet 1  . bimatoprost (LUMIGAN) 0.03 % ophthalmic solution Place 1 drop into both eyes at bedtime.    Marland Kitchen BREO ELLIPTA 100-25 MCG/INH  AEPB USE 1 INHALATION DAILY 360 each 2  . Cholecalciferol (VITAMIN D-3) 1000 UNITS CAPS Take 1 capsule by mouth daily.    . furosemide (LASIX) 20 MG tablet take 1 tablet by mouth once daily if needed 30 tablet 3  . glucose blood test strip Use to check blood sugars twice daily. Freestyle lite. Dx: E11.65 200 each 3  . Lactobacillus (PROBIOTIC ACIDOPHILUS PO) Take 1 capsule by mouth daily.    . Lancets (FREESTYLE) lancets Freestyle  Monitor.  For use twice daily  in testing blood sugar: type 2 DM uncontrolled 180 each 3  . loratadine (CLARITIN) 10 MG tablet Take 10 mg by mouth daily.    . metFORMIN (GLUCOPHAGE) 1000 MG tablet Take 1 tablet (1,000 mg total) by mouth 2 (two) times daily with a meal. 180 tablet 3  . Multiple Vitamins-Minerals (CENTRUM SILVER PO) Take by mouth. Take 1 am     . olopatadine (PATANOL) 0.1 % ophthalmic solution Place 1 drop into both eyes as needed.     . Omega-3 Fatty Acids (EQL OMEGA 3 FISH OIL) 1400 MG CAPS Take 1,400 mg by mouth daily.    . pantoprazole (PROTONIX) 40 MG tablet TAKE 1 TABLET TWICE A DAY 180 tablet 1  . Polyethyl Glycol-Propyl Glycol (SYSTANE OP) Apply to eye. 1-2 drops in each eye as needed.    . vitamin C (ASCORBIC ACID) 500 MG  tablet Take 500 mg by mouth daily.    . vitamin E (VITAMIN E) 400 UNIT capsule Take 400 Units by mouth daily.    . Evolocumab (REPATHA) 140 MG/ML SOSY Inject 1 mL into the skin every 14 (fourteen) days. (Patient not taking: Reported on 06/13/2018) 2 mL 11   No facility-administered medications prior to visit.     Review of Systems;  Patient denies headache, fevers, malaise, unintentional weight loss, skin rash, eye pain, sinus congestion and sinus pain, sore throat, dysphagia,  hemoptysis , cough, dyspnea, wheezing, chest pain, palpitations, orthopnea, edema, abdominal pain, nausea, melena, diarrhea, constipation, flank pain, dysuria, hematuria, urinary  Frequency, nocturia, numbness, tingling, seizures,  Focal weakness, Loss  of consciousness,  Tremor, insomnia, depression, anxiety, and suicidal ideation.      Objective:  BP 130/76 (BP Location: Left Arm, Patient Position: Sitting, Cuff Size: Normal)   Pulse 77   Temp 98.2 F (36.8 C) (Oral)   Resp 15   Ht 5\' 2"  (1.575 m)   Wt 177 lb 3.2 oz (80.4 kg)   SpO2 98%   BMI 32.41 kg/m   BP Readings from Last 3 Encounters:  06/13/18 130/76  04/13/18 122/70  03/04/18 130/88    Wt Readings from Last 3 Encounters:  06/13/18 177 lb 3.2 oz (80.4 kg)  04/13/18 180 lb 12.8 oz (82 kg)  03/04/18 181 lb 9.6 oz (82.4 kg)    General appearance: alert, cooperative and appears stated age Ears: normal TM's and external ear canals both ears Throat: lips, mucosa, and tongue normal; teeth and gums normal Neck: no adenopathy, no carotid bruit, supple, symmetrical, trachea midline and thyroid not enlarged, symmetric, no tenderness/mass/nodules Back: symmetric, no curvature. ROM normal. No CVA tenderness. Lungs: clear to auscultation bilaterally Heart: regular rate and rhythm, S1, S2 normal, no murmur, click, rub or gallop Abdomen: soft, non-tender; bowel sounds normal; no masses,  no organomegaly Pulses: 2+ and symmetric Skin: Skin color, texture, turgor normal. No rashes or lesions Lymph nodes: Cervical, supraclavicular, and axillary nodes normal.  Lab Results  Component Value Date   HGBA1C 6.3 06/13/2018   HGBA1C 8.1 (H) 03/04/2018   HGBA1C 7.7 (H) 11/29/2017    Lab Results  Component Value Date   CREATININE 0.86 06/13/2018   CREATININE 0.91 03/04/2018   CREATININE 0.80 11/29/2017    Lab Results  Component Value Date   WBC 5.8 11/29/2017   HGB 12.7 11/29/2017   HCT 37.6 11/29/2017   PLT 210.0 11/29/2017   GLUCOSE 106 (H) 06/13/2018   CHOL 323 (H) 06/13/2018   TRIG 207.0 (H) 06/13/2018   HDL 53.40 06/13/2018   LDLDIRECT 240.0 06/13/2018   LDLCALC 194 (H) 03/04/2018   ALT 37 (H) 06/13/2018   AST 28 06/13/2018   NA 139 06/13/2018   K 4.1 06/13/2018    CL 101 06/13/2018   CREATININE 0.86 06/13/2018   BUN 15 06/13/2018   CO2 28 06/13/2018   TSH 4.36 06/13/2018   HGBA1C 6.3 06/13/2018   MICROALBUR <0.7 06/13/2018    Ct Chest Lung Cancer Screening Low Dose Wo Contrast  Result Date: 01/31/2018 CLINICAL DATA:  72 year old female former smoker (quit 12 years ago) with 50 pack-year history of smoking. Lung cancer screening examination. EXAM: CT CHEST WITHOUT CONTRAST LOW-DOSE FOR LUNG CANCER SCREENING TECHNIQUE: Multidetector CT imaging of the chest was performed following the standard protocol without IV contrast. COMPARISON:  Low-dose lung cancer screening chest CT 01/29/2017. FINDINGS: Cardiovascular: Heart size is normal. There is no significant pericardial  fluid, thickening or pericardial calcification. There is aortic atherosclerosis, as well as atherosclerosis of the great vessels of the mediastinum and the coronary arteries, including calcified atherosclerotic plaque in the left main and left anterior descending coronary arteries. Calcifications of the mitral annulus and mitral valve. Mediastinum/Nodes: No pathologically enlarged mediastinal or hilar lymph nodes. Please note that accurate exclusion of hilar adenopathy is limited on noncontrast CT scans. Esophagus is unremarkable in appearance. No axillary lymphadenopathy. Surgical clips in the right axilla, presumably from prior lymph node dissection. Lungs/Pleura: When compared to the prior examination, the evolving postradiation changes in the subpleural aspect of the anterior aspect of the right upper and middle lobes continue to evolve and have become far more fibrotic and contracted. Multiple tiny pulmonary nodules are scattered throughout both lungs, largest of which has a volume derived mean diameter of only 3.9 mm near the apex of the left upper lobe (axial image 40 of series 3). No other larger more suspicious appearing pulmonary nodules or masses are noted. No acute consolidative airspace  disease. No pleural effusions. Diffuse bronchial wall thickening with mild centrilobular and paraseptal emphysema. Upper Abdomen: Mild diffuse low attenuation throughout the visualized portions of the hepatic parenchyma, indicative of a background of hepatic steatosis. Aortic atherosclerosis. Musculoskeletal: There are no aggressive appearing lytic or blastic lesions noted in the visualized portions of the skeleton. IMPRESSION: 1. Lung-RADS 2S, benign appearance or behavior. Continue annual screening with low-dose chest CT without contrast in 12 months. 2. The "S" modifier above refers to potentially clinically significant non lung cancer related findings. Specifically, there is aortic atherosclerosis, in addition to left main and left anterior descending coronary artery disease. Assessment for potential risk factor modification, dietary therapy or pharmacologic therapy may be warranted, if clinically indicated. 3. Mild diffuse bronchial wall thickening with mild centrilobular and paraseptal emphysema; imaging findings suggestive of underlying COPD. 4. There are calcifications of the mitral valve and annulus. Echocardiographic correlation for evaluation of potential valvular dysfunction may be warranted if clinically indicated. 5. Hepatic steatosis. Aortic Atherosclerosis (ICD10-I70.0) and Emphysema (ICD10-J43.9). Electronically Signed   By: Vinnie Langton M.D.   On: 01/31/2018 13:14    Assessment & Plan:   Problem List Items Addressed This Visit    At risk for arrhythmia    It is unclear if her home BP monitor is detecting PACs or PAF given their asymptomatic occurrence.  Recommending a 30 day heart monitor to be done prior to her follow up with Dr Fletcher Anon in February.  Referral to [facility] cardiology to have this done.        DM (diabetes mellitus), type 2, uncontrolled ([place]) - Primary    Remarkable improvement since last visit since reducing her total carbohydrate intaken and increasing her dose of  metformin.   Continue metformin   Lab Results  Component Value Date   HGBA1C 6.3 06/13/2018   Lab Results  Component Value Date   MICROALBUR <0.7 06/13/2018         Relevant Orders   Hemoglobin A1c (Completed)   Microalbumin / creatinine urine ratio (Completed)   Lipid panel (Completed)   Hyperlipidemia    Repatha discussed and trial advised given her known CAD       Relevant Medications   Evolocumab (REPATHA) 140 MG/ML SOSY    Other Visit Diagnoses    Cardiac arrhythmia, unspecified cardiac arrhythmia type       Relevant Medications   Evolocumab (REPATHA) 140 MG/ML SOSY   Other Relevant Orders  TSH (Completed)   Magnesium (Completed)   Comprehensive metabolic panel (Completed)   Ambulatory referral to Cardiology    A total of 25 minutes of face to face time was spent with patient more than half of which was spent in counselling about the above mentioned conditions  and coordination of care   I am having Traci Hall maintain her aspirin, olopatadine, Multiple Vitamins-Minerals (CENTRUM SILVER PO), vitamin C, vitamin E, Polyethyl Glycol-Propyl Glycol (SYSTANE OP), Vitamin D-3, bimatoprost, EQL OMEGA 3 FISH OIL, ALPRAZolam, BREO ELLIPTA, loratadine, albuterol, pantoprazole, furosemide, Lactobacillus (PROBIOTIC ACIDOPHILUS PO), metFORMIN, freestyle, BENICAR, amLODipine, glucose blood, and Evolocumab.  Meds ordered this encounter  Medications  . Evolocumab (REPATHA) 140 MG/ML SOSY    Sig: Inject 1 mL into the skin every 14 (fourteen) days.    Dispense:  2 mL    Refill:  11    Medications Discontinued During This Encounter  Medication Reason  . Evolocumab (REPATHA) 140 MG/ML SOSY Reorder    Follow-up: No follow-ups on file.   [name], MD

## 2018-06-13 NOTE — Patient Instructions (Addendum)
Given your history of statin intolerance, I wo recommend a trial of an injectible medication that has become available through pior authorization called "repatha." this medication is self administered as a subCutaneous injection either on a monthly basis or on a biweekly basis. It is a monclonal antibody so it works very differently than statins, and lowers cholesterol very rapidly (by the end of the first week . I will admit I have not prescribed the medication yet to anyone else, But the cardiologists have. The side effect profile is pretty mild (itching And runny nose are the most common, both are reported in In less than ten percent ).    I AM MAKING A REFERRAL for a cardiac monitor ,  If you do not receive a call in one week,  Let me know

## 2018-06-14 ENCOUNTER — Encounter: Payer: Self-pay | Admitting: Internal Medicine

## 2018-06-14 DIAGNOSIS — Z9189 Other specified personal risk factors, not elsewhere classified: Secondary | ICD-10-CM | POA: Insufficient documentation

## 2018-06-14 MED ORDER — EVOLOCUMAB 140 MG/ML ~~LOC~~ SOSY: 1.0000 mL | PREFILLED_SYRINGE | SUBCUTANEOUS | 11 refills | Status: DC

## 2018-06-14 NOTE — Assessment & Plan Note (Addendum)
Remarkable improvement since last visit since reducing her total carbohydrate intaken and increasing her dose of metformin.   Continue metformin   Lab Results  Component Value Date   HGBA1C 6.3 06/13/2018   Lab Results  Component Value Date   MICROALBUR <0.7 06/13/2018

## 2018-06-14 NOTE — Assessment & Plan Note (Signed)
It is unclear if her home BP monitor is detecting PACs or PAF given their asymptomatic occurrence.  Recommending a 30 day heart monitor to be done prior to her follow up with Dr Fletcher Anon in February.  Referral to [facility] cardiology to have this done.

## 2018-06-14 NOTE — Assessment & Plan Note (Signed)
Repatha discussed and trial advised given her known CAD

## 2018-06-22 ENCOUNTER — Telehealth: Payer: Self-pay

## 2018-06-22 NOTE — Telephone Encounter (Signed)
Copied from [place] [id number]. Topic: General - Other >> Jun 22, 2018 10:03 AM Carolyn Stare wrote:  Pt call to say Dr Derrel Nip told her to call if she had not heard from anyone concerning her wearing we heart monitor .No one has contacted her and she calling today to follow up

## 2018-06-27 ENCOUNTER — Encounter: Payer: Self-pay | Admitting: Pharmacist

## 2018-06-27 ENCOUNTER — Ambulatory Visit (INDEPENDENT_AMBULATORY_CARE_PROVIDER_SITE_OTHER): Payer: Medicare Other | Admitting: Pharmacist

## 2018-06-27 ENCOUNTER — Telehealth: Payer: Self-pay

## 2018-06-27 VITALS — BP 155/84 | HR 76 | Wt 177.4 lb

## 2018-06-27 DIAGNOSIS — E1165 Type 2 diabetes mellitus with hyperglycemia: Secondary | ICD-10-CM

## 2018-06-27 DIAGNOSIS — I10 Essential (primary) hypertension: Secondary | ICD-10-CM | POA: Diagnosis not present

## 2018-06-27 MED ORDER — EMPAGLIFLOZIN-METFORMIN HCL ER 5-1000 MG PO TB24: 2.0000 | ORAL_TABLET | Freq: Every day | ORAL | 2 refills | Status: DC

## 2018-06-27 NOTE — Telephone Encounter (Signed)
Pt saw Catie, PharmD in the office this morning and she spoke with Melissa and let the pt know what is going on with the referral.

## 2018-06-27 NOTE — Assessment & Plan Note (Signed)
#  ASCVD risk - secondary prevention in patient aged 73-75 with DM, baseline LDL 70-189; high risk due to elevated LDL. Repatha has been prescribed, but has not been approved through insurance yet.  - Spoke with Adair Laundry, CMA; she processed and submitted PA to [employer]. May take up to 72 hours for a determination - Moving forward, could consider changing omega-3-fatty acids to Vascepa for CV risk reduction  - Continue aspirin 81 mg daily

## 2018-06-27 NOTE — Assessment & Plan Note (Signed)
#  Diabetes - Currently controlled, most recent A1c 6.3% on 06/13/2018, improved from 8.1% on 03/04/2018; Goal A1c <7%. Per 2020 ADA guidelines, appropriate to consider addition of SGLT2/GLP1 agents in patients with ASCVD or who are at high risk, or SGLT2 in those with CKD. Discussed SGLT2 vs GLP1; patient preferred to initiate SGLT2. Reviewed [employer] Formulary; Iva Boop (metformin/empagliflozin) is covered.  - Initiate Synjardy (metformin/empagliflozin) XR 1000-5 mg, 2 tablet daily. Discontinue metformin separately.  - Counseled on sick day rules for empagliflozin.  - Encouraged patient to occasionally check fasting and 2 hour post prandial SMBG results to evaluate effect of therapy.

## 2018-06-27 NOTE — Assessment & Plan Note (Signed)
#  Hypertension currently uncontrolled, as patient did not take antihypertensives. Goal BP <130/80.  - Counseled patient on the importance of medication adherence.  - Continue to monitor at this time. Continue olmesartan 40 mg, amlodipine 5 mg daily

## 2018-06-27 NOTE — Progress Notes (Addendum)
S:     Chief Complaint  Patient presents with  . Medication Management    Patient arrives in good spirits with her husband, Traci Hall.  Presents for diabetes evaluation, education, and management at the request of Dr. Derrel Nip (referred in [place] message on 05/07/2018). Last seen by primary care provider on 06/13/2018, at that time, it was noted that she had made a remarkable impact on her A1c through metformin and dietary modifications.   She notes that she did not take her blood pressure medications this morning, as she has not eaten breakfast yet. Denies chest pain/pressure, radiating pain, headaches.   Today, she is most concerned about recent increases in her cholesterol. She notes a history of intolerance to "many" statins, even alternate daily dosing, and an intolerance to ezetimibe. Dr. Derrel Nip prescribed Repatha; patient is unsure where this medication is in the approval process. She is also awaiting information regarding a 30 day heart monitor.   She notes that she is tolerating metformin well, but is open to additional pharmacotherapy to reduce cardiovascular risk.   Insurance coverage/medication affordability: [employer] through her husband. No affordability concerns at this time.  Patient reports adherence with medications.  Current diabetes medications include: metformin 1000 mg BID Current hypertension medications include: olmesartan 40 mg daily, amlodipine 5 mg daily Current cholesterol medications include: Repatha (has not started); omega-3-fatty acids  Patient denies hypoglycemic s/sx including dizziness, shakiness, sweating. Patient reports hyperglycemic symptoms including peripheral neuropathy, but notes improvement in this since her BG has been better controlled.   O:  Physical Exam Vitals signs reviewed.  Constitutional:      Appearance: Normal appearance.     Review of Systems  All other systems reviewed and are negative.  Lab Results  Component Value  Date   HGBA1C 6.3 06/13/2018   Vitals:   06/27/18 1241  BP: (!) 155/84  Pulse: 76  Patient did NOT take antihypertensives this morning  Basic Metabolic Panel BMP Latest Ref Rng & Units 06/13/2018 03/04/2018 11/29/2017  Glucose 70 - 99 mg/dL 106(H) 179(H) 157(H)  BUN 6 - 23 mg/dL 15 17 14   Creatinine 0.40 - 1.20 mg/dL 0.86 0.91 0.80  Sodium 135 - 145 mEq/L 139 141 141  Potassium 3.5 - 5.1 mEq/L 4.1 4.5 4.1  Chloride 96 - 112 mEq/L 101 103 102  CO2 19 - 32 mEq/L 28 28 28   Calcium 8.4 - 10.5 mg/dL 9.8 9.8 9.5     Lipid Panel     Component Value Date/Time   CHOL 323 (H) 06/13/2018 1014   CHOL 216 (H) 04/12/2015 0837   TRIG 207.0 (H) 06/13/2018 1014   HDL 53.40 06/13/2018 1014   HDL 51 04/12/2015 0837   CHOLHDL 6 06/13/2018 1014   VLDL 41.4 (H) 06/13/2018 1014   LDLCALC 194 (H) 03/04/2018 0902   LDLCALC 131 (H) 04/12/2015 0837   LDLDIRECT 240.0 06/13/2018 1014   2 hour post-prandial/random SMBG: All <180; most <150 except for after carb heavy meals  Clinical ASCVD: Yes ; High-risk conditions: Age 73 or older, h/o DM, HTN, LDL > 100 despite max tolerated statin/ezetimibe  A/P: Following discussion and approval by Dr. Derrel Nip, the following medication changes were made:   #Diabetes - Currently controlled, most recent A1c 6.3% on 06/13/2018, improved from 8.1% on 03/04/2018; Goal A1c <7%. Per 2020 ADA guidelines, appropriate to consider addition of SGLT2/GLP1 agents in patients with ASCVD or who are at high risk, or SGLT2 in those with CKD. Discussed SGLT2 vs  GLP1; patient preferred to initiate SGLT2. Reviewed [employer] Formulary; Traci Hall (metformin/empagliflozin) is covered.  - Initiate Synjardy (metformin/empagliflozin) XR 1000-5 mg, 2 tablet daily. Discontinue metformin separately.  - Counseled on sick day rules for empagliflozin.  - Encouraged patient to occasionally check fasting and 2 hour post prandial SMBG results to evaluate effect of therapy.    #Hypertension  currently uncontrolled, as patient did not take antihypertensives. Goal BP <130/80.  - Counseled patient on the importance of medication adherence.  - Continue to monitor at this time. Continue olmesartan 40 mg, amlodipine 5 mg daily  #ASCVD risk - secondary prevention in patient aged 18-75 with DM, baseline LDL 70-189; high risk due to elevated LDL. Repatha has been prescribed, but has not been approved through insurance yet.  - Spoke with Adair Laundry, CMA; she processed and submitted PA to [employer]. May take up to 72 hours for a determination - Moving forward, could consider changing omega-3-fatty acids to Vascepa for CV risk reduction  - Continue aspirin 81 mg daily  Written patient instructions provided.  Total time in face to face counseling 60 minutes.    Follow up in Pharmacist Clinic Visit 4 weeks.   Patient seen with Theodis Blaze, PharmD Candidate.   De Hollingshead, PharmD PGY2 Ambulatory Care Pharmacy Resident Phone: [phone]   I have reviewed the above information and agree with above.   Deborra Medina, MD

## 2018-06-27 NOTE — Patient Instructions (Addendum)
It was great to meet you both today!  We are going to make a change:   1) Start Synjardy XR 10-998 mg 2 tablets daily. This will replace the metformin that you currently have.  2) Janett Billow completed the authorization for [place]. They say it can take up to 72 hours for a determination.  3) Our scheduling coordinator previously reached out to Dr. Tyrell Antonio office, and that nurse should be working on getting the heart monitor set up.   Continue the great work that you have done with your diet. That has helped tremendously!  If you check blood sugar first thing in the morning, goal is less than 130 If you check blood sugar 2 hours after a meal, goal is less than 180  Make sure you stay well hydrated on J[place]. If you have a day where you get dehydrated from nausea/vomting, just do not take the [place].   Please contact clinic if you have any concerns of yeast infections or urinary tract infections. Call with any other questions.   Catie Darnelle Maffucci, PharmD

## 2018-06-27 NOTE — Telephone Encounter (Signed)
PA for Repatha has been submitted on covermymeds.  

## 2018-06-27 NOTE — Telephone Encounter (Signed)
repatha pa has been submitted

## 2018-06-29 ENCOUNTER — Telehealth: Payer: Self-pay | Admitting: *Deleted

## 2018-06-29 DIAGNOSIS — I499 Cardiac arrhythmia, unspecified: Secondary | ICD-10-CM

## 2018-06-29 NOTE — Addendum Note (Signed)
Addended by: Vanessa Ralphs on: 06/29/2018 09:27 AM   Modules accepted: Orders

## 2018-06-29 NOTE — Telephone Encounter (Signed)
Patient notified that we would be registering her for the [employer] Monitor and that it would ship to her with all the instructions. She is aware to mail it back in the box provided after wearing for 30 days. Verified her phone number and address.  Registered patient on [employer].

## 2018-06-29 NOTE — Telephone Encounter (Signed)
-----   Message from Blain Pais sent at 06/28/2018  9:40 AM EST ----- Regarding: FW: monitor This patient needs a 30 day monitor  ----- Message ----- From: Eustace Pen Sent: 06/27/2018   8:19 AM EST To: Blain Pais Subject: monitor                                        Syliva Overman, This is the patient that I called about regarding the 30 day cardionet or other arrhythmia monitor for detection of frequent arrhythmias not occurring daily. She is established with Dr. Fletcher Anon and scheduled for a follow up in Feb.  Thank you! [employer]

## 2018-07-04 NOTE — Telephone Encounter (Signed)
Patient calling to check on status of PA on Repatha? Is supposed to be sent to mail order.

## 2018-07-04 NOTE — Telephone Encounter (Signed)
See below

## 2018-07-04 NOTE — Telephone Encounter (Signed)
PA for Repatha was denied. Pt is aware. Pt is wondering if she needs to wait until she sees Dr. Fletcher Anon in February and after she wears the heart monitor that will be placed on Thursday.

## 2018-07-06 ENCOUNTER — Telehealth: Payer: Self-pay | Admitting: *Deleted

## 2018-07-06 DIAGNOSIS — R600 Localized edema: Secondary | ICD-10-CM | POA: Diagnosis not present

## 2018-07-06 DIAGNOSIS — Z171 Estrogen receptor negative status [ER-]: Secondary | ICD-10-CM | POA: Diagnosis not present

## 2018-07-06 DIAGNOSIS — C50411 Malignant neoplasm of upper-outer quadrant of right female breast: Secondary | ICD-10-CM | POA: Diagnosis not present

## 2018-07-06 DIAGNOSIS — Z79899 Other long term (current) drug therapy: Secondary | ICD-10-CM | POA: Diagnosis not present

## 2018-07-06 DIAGNOSIS — Z7982 Long term (current) use of aspirin: Secondary | ICD-10-CM | POA: Diagnosis not present

## 2018-07-06 DIAGNOSIS — Z885 Allergy status to narcotic agent status: Secondary | ICD-10-CM | POA: Diagnosis not present

## 2018-07-06 DIAGNOSIS — Z923 Personal history of irradiation: Secondary | ICD-10-CM | POA: Diagnosis not present

## 2018-07-06 DIAGNOSIS — G629 Polyneuropathy, unspecified: Secondary | ICD-10-CM | POA: Diagnosis not present

## 2018-07-06 DIAGNOSIS — Z9221 Personal history of antineoplastic chemotherapy: Secondary | ICD-10-CM | POA: Diagnosis not present

## 2018-07-06 LAB — HM MAMMOGRAPHY: HM Mammogram: NORMAL (ref 0–4)

## 2018-07-06 NOTE — Telephone Encounter (Signed)
Notification from [place] that the patient has delayed putting her monitor on until 07/07/2018. Message has been verified with [employer].

## 2018-07-07 ENCOUNTER — Ambulatory Visit (INDEPENDENT_AMBULATORY_CARE_PROVIDER_SITE_OTHER): Payer: Medicare Other

## 2018-07-07 ENCOUNTER — Encounter: Payer: Self-pay | Admitting: Cardiovascular Disease

## 2018-07-07 DIAGNOSIS — I499 Cardiac arrhythmia, unspecified: Secondary | ICD-10-CM | POA: Diagnosis not present

## 2018-07-09 ENCOUNTER — Encounter: Payer: Self-pay | Admitting: Cardiovascular Disease

## 2018-07-09 ENCOUNTER — Telehealth: Payer: Self-pay | Admitting: Cardiology

## 2018-07-10 NOTE — Telephone Encounter (Signed)
Paged by answering service, instructed to call [employer] due to event. Returned call to [employer]. Notified that patient had baseline sinus rhythm, then 18 beats of what appears to be VT at a rate of approximately 170 bpm. [employer] called the patient, and she reported that she was asymptomatic. Will route to provider for follow up.  Buford Dresser, MD, PhD [facility]  [address] [place], [place] [address] [phone]

## 2018-07-11 ENCOUNTER — Other Ambulatory Visit: Payer: Self-pay | Admitting: Internal Medicine

## 2018-07-11 ENCOUNTER — Telehealth: Payer: Self-pay | Admitting: *Deleted

## 2018-07-11 DIAGNOSIS — I2510 Atherosclerotic heart disease of native coronary artery without angina pectoris: Secondary | ICD-10-CM

## 2018-07-11 DIAGNOSIS — I499 Cardiac arrhythmia, unspecified: Secondary | ICD-10-CM

## 2018-07-11 NOTE — Telephone Encounter (Addendum)
-----   Message from Wellington Hampshire, MD sent at 07/11/2018  4:36 PM EST ----- Monitor showed one episode of wide-complex tachycardia most likely ventricular tachycardia.  This requires further evaluation.  I recommend a Lexiscan Myoview to rule out ischemia and an echocardiogram to evaluate EF and exclude other structural abnormalities.  Follow-up with me after this test.

## 2018-07-11 NOTE — Telephone Encounter (Addendum)
The patient has been called and made aware of the request to have an Echo and [employer] completed. Preventice monitor reading showed a 13 beat run of vtach.   She has agreed to the tests. Orders have been placed and message sent to scheduling.

## 2018-07-12 NOTE — Telephone Encounter (Signed)
Please call patient with instructions for nuclear stress test

## 2018-07-12 NOTE — Telephone Encounter (Signed)
Called patient and gave her instructions over the phone and sent MyChart as listed below. She verbalized understanding.   [place]  Your caregiver has ordered a Stress Test with nuclear imaging. The purpose of this test is to evaluate the blood supply to your heart muscle. This procedure is referred to as a "Non-Invasive Stress Test." This is because other than having an IV started in your vein, nothing is inserted or "invades" your body. Cardiac stress tests are done to find areas of poor blood flow to the heart by determining the extent of coronary artery disease (CAD). Some patients exercise on a treadmill, which naturally increases the blood flow to your heart, while others who are  unable to walk on a treadmill due to physical limitations have a pharmacologic/chemical stress agent called Lexiscan . This medicine will mimic walking on a treadmill by temporarily increasing your coronary blood flow.   Please note: these test may take anywhere between 2-4 hours to complete  PLEASE REPORT TO [facility] MEDICAL MALL ENTRANCE  THE VOLUNTEERS AT THE FIRST DESK WILL DIRECT YOU WHERE TO GO  Date of Procedure:________01/31/2020__________  Arrival Time for Procedure:_______08:30 AM_________  Instructions regarding medication:   _XX_ : Hold diabetes medication (SYNJARDY) morning of procedure  _XX_:  Hold other medications as follows:______FUROSEMIDE_______  PLEASE NOTIFY THE OFFICE AT LEAST 24 HOURS IN ADVANCE IF YOU ARE UNABLE TO KEEP YOUR APPOINTMENT.  [phone] AND  PLEASE NOTIFY NUCLEAR MEDICINE AT [facility] AT LEAST 24 HOURS IN ADVANCE IF YOU ARE UNABLE TO KEEP YOUR APPOINTMENT. [phone]  How to prepare for your Myoview test:  1. Do not eat or drink after midnight 2. No caffeine for 24 hours prior to test 3. No smoking 24 hours prior to test. 4. Your medication may be taken with water.  If your doctor stopped a medication because of this test, do not take that medication. 5. Ladies,  please do not wear dresses.  Skirts or pants are appropriate. Please wear a short sleeve shirt. 6. No perfume, cologne or lotion. 7. Wear comfortable walking shoes. No heels!

## 2018-07-15 ENCOUNTER — Encounter
Admission: RE | Admit: 2018-07-15 | Discharge: 2018-07-15 | Disposition: A | Discharge status: Home / Self Care | Payer: Medicare Other | Source: Ambulatory Visit | Attending: Cardiovascular Disease | Admitting: Cardiovascular Disease

## 2018-07-15 DIAGNOSIS — I499 Cardiac arrhythmia, unspecified: Secondary | ICD-10-CM | POA: Insufficient documentation

## 2018-07-15 DIAGNOSIS — I2510 Atherosclerotic heart disease of native coronary artery without angina pectoris: Secondary | ICD-10-CM | POA: Diagnosis not present

## 2018-07-15 LAB — NM MYOCAR MULTI W/SPECT W/WALL MOTION / EF
CSEPEDS: 0 s
CSEPHR: 78 %
Estimated workload: 1 METS
Exercise duration (min): 0 min
LV dias vol: 45 mL (ref 46–106)
LV sys vol: 16 mL
MPHR: 148 {beats}/min
NUC STRESS TID: 0.91
Peak HR: 116 {beats}/min
Rest HR: 80 {beats}/min
SDS: 3
SRS: 1
SSS: 9

## 2018-07-15 MED ORDER — TECHNETIUM TC 99M TETROFOSMIN IV KIT
32.7800 | PACK | Freq: Once | INTRAVENOUS | Status: AC | PRN
  Administered 2018-07-15: 32.78 via INTRAVENOUS

## 2018-07-15 MED ORDER — TECHNETIUM TC 99M TETROFOSMIN IV KIT
10.9800 | PACK | Freq: Once | INTRAVENOUS | Status: AC | PRN
  Administered 2018-07-15: 10.98 via INTRAVENOUS

## 2018-07-15 MED ORDER — REGADENOSON 0.4 MG/5ML IV SOLN
0.4000 mg | Freq: Once | INTRAVENOUS | Status: AC
  Administered 2018-07-15: 0.4 mg via INTRAVENOUS

## 2018-08-01 ENCOUNTER — Encounter: Payer: Self-pay | Admitting: Pharmacist

## 2018-08-01 ENCOUNTER — Ambulatory Visit (INDEPENDENT_AMBULATORY_CARE_PROVIDER_SITE_OTHER): Payer: Medicare Other | Admitting: Pharmacist

## 2018-08-01 VITALS — BP 128/79 | HR 73 | Wt 175.0 lb

## 2018-08-01 DIAGNOSIS — I10 Essential (primary) hypertension: Secondary | ICD-10-CM | POA: Diagnosis not present

## 2018-08-01 DIAGNOSIS — E7800 Pure hypercholesterolemia, unspecified: Secondary | ICD-10-CM | POA: Diagnosis not present

## 2018-08-01 DIAGNOSIS — I2510 Atherosclerotic heart disease of native coronary artery without angina pectoris: Secondary | ICD-10-CM | POA: Diagnosis not present

## 2018-08-01 DIAGNOSIS — E1165 Type 2 diabetes mellitus with hyperglycemia: Secondary | ICD-10-CM | POA: Diagnosis not present

## 2018-08-01 MED ORDER — ICOSAPENT ETHYL 1 G PO CAPS: 2.0000 g | ORAL_CAPSULE | Freq: Two times a day (BID) | ORAL | 2 refills | Status: DC

## 2018-08-01 NOTE — Assessment & Plan Note (Signed)
#  Hypertension currently controlled. Goal BP <130/80mmHg. Patient reports adherence with medication.  - Continue current regimen olmesartan 40 mg and amlodipine 5 mg daily - Continue Synjardy (metformin/empagliflozin) as this has a positive effect on blood pressure

## 2018-08-01 NOTE — Progress Notes (Signed)
S:     Chief Complaint  Patient presents with  . Medication Management    Diabetes   Patient arrives in good spirits with her husband, Rush Landmark.  Presents for diabetes evaluation, education, and management at the request of Dr. Derrel Nip (referred in [place] message on 05/07/2018). Last seen by primary care provider on 06/13/2018, at that time, it was noted that she had made a remarkable impact on her A1c through metformin and dietary modifications. She was seen in pharmacy clinic 06/27/2018. At that time Synjardy (metformin/empagliflozin) replaced her diabetic regimen of metformin only to optimize her diabetes control and provide cardiovascular benefit.   Today, she is proud of her recent weight loss (2lbs) and her better controlled blood pressure and blood glucose. She is still concerned about recent increases in her cholesterol. The request for Repatha was denied by her insurance with no reason given. She is currently wearing a 30 day heart monitor that of note reported 18 beats of VT on 07/09/18. Patient reports noticing her heart was racing but denies any further events. She has an appointment with her cardiologist next week. In addition to her aspirin 81mg  daily she has been taking two aspirin 325mg  at night before bed time to help with feet swelling and muscle pain.       Insurance coverage/medication affordability: [employer] through her husband. No affordability concerns at this time.  Patient reports adherence with medications.  Current diabetes medications include: Synjardy (empagliflozin-metformin) 10-998 mg BID Current hypertension medications include: Olmesartan 50 mg daily, amlodipine 5 mg daily,  Current hyperlipidemic medications include: Nothing right now - Repatha has not been approved by insurance yet  - Rosuvastatin 5 mg - failed daily dosing, every three day dosing and 1/2 pill dosing. Each attempt for ~1 month. Patient reports myalgias and joint pain - Atorvastatin - unsure  of dose, tried for 3 weeks, reports myalgias and joint pain - Simvastatin - unsure of doses, tried for 1 month, reports myalgia and joint pain - Ezetimibe 10 mg - reports muscle aches, joint aches, ankle pain.   Patient reports hypoglycemic s/sx including hunger, tiredness (but had not eaten breakfast that day). Patient denies hyperglycemic symptoms including polyuria, polydipsia, polyphagia, nocturia, neuropathy, blurred vision. Patient reports no change in her peripheral neuropathy.   Patient reported dietary habits: Eats 2-3 meals/day Breakfast: Coffee; Psychologist, occupational; breakfast hot pocket; sausage, egg and cheese criossant (27 carbs); bacon and spinach frittata, homemade quiche; coffee + 1 sugar  Lunch: sometimes sandwich; but typically more of a snack (Ensure, peanuts/almonds, apples) Dinner: Typically eating sandwiches (ham and cheese on rye, occasionally potato chips; light lemonade;  Drinks: Water, lite lemonade (5 carbs); switched to lighter beer; occasional margarita   Patient reported exercise habits: No exercise right now  O:  Physical Exam Vitals signs reviewed.    Review of Systems  Cardiovascular: Positive for palpitations.  All other systems reviewed and are negative.    Lab Results  Component Value Date   HGBA1C 6.3 06/13/2018   Vitals:   08/01/18 1023  BP: 128/79  Pulse: 73    Basic Metabolic Panel BMP Latest Ref Rng & Units 06/13/2018 03/04/2018 11/29/2017  Glucose 70 - 99 mg/dL 106(H) 179(H) 157(H)  BUN 6 - 23 mg/dL 15 17 14   Creatinine 0.40 - 1.20 mg/dL 0.86 0.91 0.80  Sodium 135 - 145 mEq/L 139 141 141  Potassium 3.5 - 5.1 mEq/L 4.1 4.5 4.1  Chloride 96 - 112 mEq/L 101 103 102  CO2 19 - 32 mEq/L 28 28 28   Calcium 8.4 - 10.5 mg/dL 9.8 9.8 9.5     Lipid Panel     Component Value Date/Time   CHOL 323 (H) 06/13/2018 1014   CHOL 216 (H) 04/12/2015 0837   TRIG 207.0 (H) 06/13/2018 1014   HDL 53.40 06/13/2018 1014   HDL 51 04/12/2015 0837    CHOLHDL 6 06/13/2018 1014   VLDL 41.4 (H) 06/13/2018 1014   LDLCALC 194 (H) 03/04/2018 0902   LDLCALC 131 (H) 04/12/2015 0837   LDLDIRECT 240.0 06/13/2018 1014    Self-Monitored Blood Glucose Results Fasting SMBG: none reported  2 hour post-prandial/random SMBG:  96-213 with previous metformin only  72-177 with current dose of Synjardy; majority <150 No blood glucose >200 since on Synjardy   Clinical ASCVD: Yes ; High-risk conditions: Age 55 or older, h/o DM, HTN, LDL > 100 despite max tolerated statin/ezetimibe   A/P: #Diabetes - Currently controlled, most recent A1c 6.3% on 06/13/2018, improved from 8.1% on 03/04/2018; Goal A1c <6.5%. Patient reports hypoglycemic events. Patient reports adherence with medication.  - Continue Synjardy (metformin/empagliflozin) XR 1000-5 mg, 2 tablet daily - Next A1C anticipated 09/14/2018 with appointment with Dr. Derrel Nip - Continue eating a healthy diet, congratulated patient on weight loss  - Continue post prandial blood glucose checks, consider adding in some fasting blood glucose checks in the morning.    #Hypertension currently controlled. Goal BP <130/34mmHg. Patient reports adherence with medication.  - Continue current regimen olmesartan 40 mg and amlodipine 5 mg daily - Continue Synjardy (metformin/empagliflozin) as this has a positive effect on blood pressure   #ASCVD risk - secondary prevention in patient aged 16-75 with DM, baseline LDL 70-189; high risk due to elevated LDL. Repatha has been prescribed, but has denied through insurance. I called PA appeals and representative stated the claim was denied due to patient not having familial hypercholesteremia or clinical ASCVD. I will work with Janett Billow to resubmit PA as patient does meet clinical ASCVD requirements with CAD and angina.  - Start Vascepa 2g BID to replace omega 3 fatty acids for CV risk reduction as triglycerides are elevated at 207 - Continue aspirin 81 mg daily  #Medication  Management - Patient reports taking >600mg  of aspirin daily. Patient reports history of liver disease. No diagnosis in the patient's chart, AST normal, ALT mildly elevated.   - Stop taking aspirin for pain, consider acetaminophen max 2000mg  daily as needed for pain. Synjardy (metformin/empagliflozin) should help with fluid retention.  - Counseled patient on possible stomach and esophageal erosion with continued use of high dose aspirin.    Written patient instructions provided.  Total time in face to face counseling 45 minutes.    Follow up in Pharmacist Clinic Visit as needed.   Patient seen with Isaias Sakai, PharmD, PGY1 Resident.   De Hollingshead, PharmD, [place] PGY2 Ambulatory Care Pharmacy Resident Phone: [phone]

## 2018-08-01 NOTE — Patient Instructions (Addendum)
It was great to see you today!  We are not going to make any changes to your diabetes regimen.   Continue checking your blood sugar: 1) Fasting, before eating breakfast. The goal fasting blood sugar is less than 130 mg/dL 2) 2 hours after the largest meal of your day. The goal 2-hour after meal blood sugar is less than 180 mg/dL  I am going to send in Vascepa (icosapent ethyl) 2 g twice daily. Make sure that you take this with a meal on your stomach. This will replace omega-3-fatty acids.   I would recommend avoiding taking that much aspirin, as that can cause esophageal and stomach ulcers.   Please feel free to contact clinic with any questions or concerns. Follow up with Dr. Derrel Nip as scheduled, and you will be due for an A1c at that time.  Catie Darnelle Maffucci, PharmD

## 2018-08-01 NOTE — Assessment & Plan Note (Signed)
#  Diabetes - Currently controlled, most recent A1c 6.3% on 06/13/2018, improved from 8.1% on 03/04/2018; Goal A1c <6.5%. Patient reports hypoglycemic events. Patient reports adherence with medication.  - Continue Synjardy (metformin/empagliflozin) XR 1000-5 mg, 2 tablet daily - Next A1C anticipated 09/14/2018 with appointment with Dr. Derrel Nip - Continue eating a healthy diet, congratulated patient on weight loss  - Continue post prandial blood glucose checks, consider adding in some fasting blood glucose checks in the morning.

## 2018-08-01 NOTE — Assessment & Plan Note (Signed)
#  ASCVD risk - secondary prevention in patient aged 73-75 with DM, baseline LDL 70-189; high risk due to elevated LDL. Repatha has been prescribed, but has denied through insurance. I called PA appeals and representative stated the claim was denied due to patient not having familial hypercholesteremia or clinical ASCVD. I will work with Janett Billow to resubmit PA as patient does meet clinical ASCVD requirements with CAD and angina.  - Start Vascepa 2g BID to replace omega 3 fatty acids for CV risk reduction as triglycerides are elevated at 207 - Continue aspirin 81 mg daily

## 2018-08-05 ENCOUNTER — Other Ambulatory Visit: Payer: Medicare Other

## 2018-08-11 ENCOUNTER — Ambulatory Visit (INDEPENDENT_AMBULATORY_CARE_PROVIDER_SITE_OTHER): Payer: Medicare Other | Admitting: Cardiovascular Disease

## 2018-08-11 ENCOUNTER — Encounter: Payer: Self-pay | Admitting: Cardiovascular Disease

## 2018-08-11 VITALS — BP 122/70 | HR 67 | Ht 62.0 in | Wt 175.0 lb

## 2018-08-11 DIAGNOSIS — E7801 Familial hypercholesterolemia: Secondary | ICD-10-CM

## 2018-08-11 DIAGNOSIS — I2510 Atherosclerotic heart disease of native coronary artery without angina pectoris: Secondary | ICD-10-CM | POA: Diagnosis not present

## 2018-08-11 DIAGNOSIS — I4729 Other ventricular tachycardia: Secondary | ICD-10-CM

## 2018-08-11 DIAGNOSIS — E78019 Familial hypercholesterolemia, unspecified: Secondary | ICD-10-CM

## 2018-08-11 DIAGNOSIS — I472 Ventricular tachycardia: Secondary | ICD-10-CM | POA: Diagnosis not present

## 2018-08-11 DIAGNOSIS — I10 Essential (primary) hypertension: Secondary | ICD-10-CM | POA: Diagnosis not present

## 2018-08-11 MED ORDER — EVOLOCUMAB 140 MG/ML ~~LOC~~ SOAJ: 140.0000 mg | SUBCUTANEOUS | 11 refills | Status: DC

## 2018-08-11 NOTE — Progress Notes (Signed)
Cardiology Office Note   Date:  08/11/2018   ID:  Traci Hall, [name] 29-Oct-1945, MRN [id number]  PCP:  [name], MD  Cardiologist:   [name], MD   Chief Complaint  Patient presents with  . other    12 month follow up. Meds reviewed by the pt. verbally. Pt. c/o irreg. heart beats & discuss myoview & event monitor, was not able to have Echo due to the snow.       History of Present Illness: Traci Hall is a 73 y.o. female who presents for  a follow-up visit regarding  coronary atherosclerosis noted on CT scan of the chest and recent palpitations.  She has known history of type 2 diabetes, previous tobacco use with mild COPD with an asthmatic component, essential hypertension, sleep apnea on CPAP, GERD, hyperlipidemia with intolerance to statins, breast cancer and obesity. She quit smoking in 2007. She has no family history of premature coronary artery disease.  She recently complained of intermittent palpitations.  She underwent a 30-day outpatient monitor which showed normal sinus rhythm with no evidence of atrial fibrillation.  She was noted to have 2 episodes of wide-complex tachycardia suggestive of ventricular tachycardia.  The first 1 lasted 13 beats and the other was 15 beats.  No other significant arrhythmia.  Due to that, she underwent a Lexiscan Myoview which showed no evidence of ischemia with normal ejection fraction. She reports that her palpitations are not very frequent.  No chest pain or shortness of breath.   Past Medical History:  Diagnosis Date  . Allergic rhinitis   . Asthma   . Barrett's esophagus   . Cancer ([place])   . COPD (chronic obstructive pulmonary disease) ([place])    by prior PFTs  . GERD (gastroesophageal reflux disease)   . Hyperlipidemia   . Hypertension   . Obesity   . OSA on CPAP   . Personal history of tobacco use, presenting hazards to health 01/08/2015  . Polyp of colon     Past Surgical History:  Procedure Laterality  Date  . ABDOMINAL HYSTERECTOMY  1991   secondary to endometriosis, and polyps  . APPENDECTOMY  1977  . BREAST BIOPSY Right 02/24/2016   path pending/biopsy  . BREAST SURGERY  03/27/2016   Lymph node removal  . COLONOSCOPY    . ESOPHAGOGASTRODUODENOSCOPY    . ESOPHAGOGASTRODUODENOSCOPY (EGD) WITH PROPOFOL N/A 03/17/2016   Procedure: ESOPHAGOGASTRODUODENOSCOPY (EGD) WITH PROPOFOL;  Surgeon: [name], MD;  Location[facility] ENDOSCOPY;  Service: Endoscopy;  Laterality: N/A;  . LIVER BIOPSY    . NASAL SINUS SURGERY  2008  . SKIN CANCER EXCISION  2015, 2017     Current Outpatient Medications  Medication Sig Dispense Refill  . albuterol (PROAIR HFA) 108 (90 Base) MCG/ACT inhaler USE 2 INHALATIONS EVERY MORNING ONLY 25.5 g 3  . ALPRAZolam (XANAX) 0.25 MG tablet Take 1 tablet (0.25 mg total) by mouth at bedtime as needed for anxiety. 30 tablet 0  . amLODipine (NORVASC) 5 MG tablet Take 1 tablet (5 mg total) by mouth daily. 90 tablet 3  . aspirin 81 MG tablet Take 81 mg by mouth daily.    Marland Kitchen BENICAR 40 MG tablet TAKE 1 TABLET DAILY 90 tablet 1  . bimatoprost (LUMIGAN) 0.03 % ophthalmic solution Place 1 drop into both eyes at bedtime.    Marland Kitchen BREO ELLIPTA 100-25 MCG/INH AEPB USE 1 INHALATION DAILY 360 each 2  . Cholecalciferol (VITAMIN D-3) 1000 UNITS CAPS  Take 1 capsule by mouth daily.    . Empagliflozin-metFORMIN HCl ER (SYNJARDY XR) 10-998 MG TB24 Take 2 tablets by mouth daily. 180 tablet 2  . furosemide (LASIX) 20 MG tablet take 1 tablet by mouth once daily if needed 30 tablet 3  . glucose blood test strip Use to check blood sugars twice daily. Freestyle lite. Dx: E11.65 200 each 3  . Lactobacillus (PROBIOTIC ACIDOPHILUS PO) Take 1 capsule by mouth daily.    . Lancets (FREESTYLE) lancets Freestyle  Monitor.  For use twice daily  in testing blood sugar: type 2 DM uncontrolled 180 each 3  . loratadine (CLARITIN) 10 MG tablet Take 10 mg by mouth daily.    . Multiple Vitamins-Minerals  (CENTRUM SILVER PO) Take by mouth. Take 1 am     . olopatadine (PATANOL) 0.1 % ophthalmic solution Place 1 drop into both eyes as needed.     . Omega-3 Fatty Acids (EQL OMEGA 3 FISH OIL) 1400 MG CAPS Take 1,400 mg by mouth daily.    . pantoprazole (PROTONIX) 40 MG tablet TAKE 1 TABLET TWICE A DAY 180 tablet 4  . Polyethyl Glycol-Propyl Glycol (SYSTANE OP) Apply to eye. 1-2 drops in each eye as needed.    . vitamin C (ASCORBIC ACID) 500 MG tablet Take 500 mg by mouth daily.    . vitamin E (VITAMIN E) 400 UNIT capsule Take 400 Units by mouth daily.    . Evolocumab (REPATHA) 140 MG/ML SOSY Inject 1 mL into the skin every 14 (fourteen) days. (Patient not taking: Reported on 08/11/2018) 2 mL 11  . Icosapent Ethyl (VASCEPA) 1 g CAPS Take 2 capsules (2 g total) by mouth 2 (two) times daily. (Patient not taking: Reported on 08/11/2018) 120 capsule 2   No current facility-administered medications for this visit.     Allergies:   Alendronate sodium; Demerol; Naproxen; and Statins    Social History:  The patient  reports that she quit smoking about 13 years ago. Her smoking use included cigarettes. She has a 67.50 pack-year smoking history. She has never used smokeless tobacco. She reports current alcohol use of about 2.0 standard drinks of alcohol per week. She reports that she does not use drugs.   Family History:  The patient's family history includes Autism in her grandchild; COPD in her mother; Emphysema in her mother; Heart disease in her father; Hyperlipidemia in her brother, brother, brother, daughter, father, and son; Hypertension in her brother, daughter, and mother; Stroke in her maternal grandfather.    ROS:  Please see the history of present illness.   Otherwise, review of systems are positive for none.   All other systems are reviewed and negative.    PHYSICAL EXAM: VS:  BP 122/70 (BP Location: Left Arm, Patient Position: Sitting, Cuff Size: Normal)   Pulse 67   Ht 5\' 2"  (1.575 m)   Wt  175 lb (79.4 kg)   BMI 32.01 kg/m  , BMI Body mass index is 32.01 kg/m. GEN: Well nourished, well developed, in no acute distress  HEENT: normal  Neck: no JVD, carotid bruits, or masses Cardiac: RRR; no rubs, or gallops,no edema . 1/6 systolic ejection murmur at the base of the heart Respiratory:  clear to auscultation bilaterally, normal work of breathing GI: soft, nontender, nondistended, + BS MS: no deformity or atrophy  Skin: warm and dry, no rash Neuro:  Strength and sensation are intact Psych: euthymic mood, full affect   EKG:  EKG is ordered today. The  ekg ordered today demonstrates normal sinus rhythm with nonspecific ST changes.   Recent Labs: 11/29/2017: Hemoglobin 12.7; Platelets 210.0 06/13/2018: ALT 37; BUN 15; Creatinine, Ser 0.86; Magnesium 1.6; Potassium 4.1; Sodium 139; TSH 4.36    Lipid Panel    Component Value Date/Time   CHOL 323 (H) 06/13/2018 1014   CHOL 216 (H) 04/12/2015 0837   TRIG 207.0 (H) 06/13/2018 1014   HDL 53.40 06/13/2018 1014   HDL 51 04/12/2015 0837   CHOLHDL 6 06/13/2018 1014   VLDL 41.4 (H) 06/13/2018 1014   LDLCALC 194 (H) 03/04/2018 0902   LDLCALC 131 (H) 04/12/2015 0837   LDLDIRECT 240.0 06/13/2018 1014      Wt Readings from Last 3 Encounters:  08/11/18 175 lb (79.4 kg)  08/01/18 175 lb (79.4 kg)  06/27/18 177 lb 6.4 oz (80.5 kg)        No flowsheet data found.    ASSESSMENT AND PLAN:  1.  Coronary atherosclerosis without angina: The patient has known history of coronary atherosclerosis.  Currently with no anginal symptoms.  I recommend continuing aggressive medical therapy. Recent stress test was normal.  2.  Nonsustained ventricular tachycardia: The exact etiology is not entirely clear.  Recent stress testing was normal.  EF is normal.  She is going to get an echocardiogram to ensure no other structural heart abnormalities.  In the absence of ischemia, normal ejection fraction and no structural heart abnormalities, I  do not think this require further treatment unless she becomes more symptomatic.  3. Essential hypertension: Blood pressure is controlled on current medications.  4.  Severe mixed hyperlipidemia: She has strong family history of very elevated cholesterol.  Recent lipid profile in December showed an LDL of 240.  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  In addition, she has known history of coronary atherosclerosis and she is diabetic. We have tried all statins and Zetia and she could not tolerate due to myalgia.  Due to that, I recommend treating with a PCSK9 inhibitor. She does have elevated triglyceride.  However, I think the priority should be noted to bring her LDL down.   Disposition:   FU with me in 6 months  Signed,  [name], MD  08/11/2018 10:09 AM    [employer]

## 2018-08-11 NOTE — Progress Notes (Signed)
I have reviewed the above note and agree. I was available to the pharmacist for consultation.  Dinari Stgermaine, MD  

## 2018-08-11 NOTE — Patient Instructions (Signed)
Medication Instructions:  Dr. Fletcher Anon recommends Repatha 140 mg every 14 days (PCSK9). This is an injectable cholesterol medication. This medication will need prior approval with your insurance company, which we will work on. If the medication is not approved initially, we may need to do an appeal with your insurance. We will keep you updated on this process. This medication can be provided at some local pharmacies or be shipped to you from a specialty pharmacy.  STOP the Vascepa   If you need a refill on your cardiac medications before your next appointment, please call your pharmacy.   Lab work: Your provider would like for you to return in 2 months to have the following labs drawn: fasting lipid and liver. Please go to the [facility] entrance and check in at the front desk. You do not need an appointment.   If you have labs (blood work) drawn today and your tests are completely normal, you will receive your results only by: Marland Kitchen MyChart Message (if you have MyChart) OR . A paper copy in the mail If you have any lab test that is abnormal or we need to change your treatment, we will call you to review the results.  Testing/Procedures: None ordered  Follow-Up: At [facility], you and your health needs are our priority.  As part of our continuing mission to provide you with exceptional heart care, we have created designated Provider Care Teams.  These Care Teams include your primary Cardiologist (physician) and Advanced Practice Providers (APPs -  Physician Assistants and Nurse Practitioners) who all work together to provide you with the care you need, when you need it. You will need a follow up appointment in 6 months.  Please call our office 2 months in advance to schedule this appointment.  You may see Dr. Fletcher Anon or one of the following Advanced Practice Providers on your designated Care Team:   Murray Hodgkins, NP Christell Faith, PA-C . Marrianne Mood, PA-C

## 2018-08-15 ENCOUNTER — Ambulatory Visit (INDEPENDENT_AMBULATORY_CARE_PROVIDER_SITE_OTHER): Payer: Medicare Other

## 2018-08-15 DIAGNOSIS — I2510 Atherosclerotic heart disease of native coronary artery without angina pectoris: Secondary | ICD-10-CM | POA: Diagnosis not present

## 2018-08-17 ENCOUNTER — Telehealth: Payer: Self-pay | Admitting: *Deleted

## 2018-08-17 NOTE — Telephone Encounter (Signed)
Patient made aware of results and verbalized understanding.  

## 2018-08-17 NOTE — Telephone Encounter (Signed)
Left a message for the patient to call back.  

## 2018-08-17 NOTE — Telephone Encounter (Signed)
-----   Message from Wellington Hampshire, MD sent at 08/16/2018  5:41 PM EST ----- Inform patient that echo was fine.  Normal EF with no significant valvular abnormalities.

## 2018-08-17 NOTE — Telephone Encounter (Signed)
Patient returning call for results 

## 2018-09-14 ENCOUNTER — Ambulatory Visit (INDEPENDENT_AMBULATORY_CARE_PROVIDER_SITE_OTHER): Payer: Medicare Other | Admitting: Internal Medicine

## 2018-09-14 ENCOUNTER — Ambulatory Visit: Payer: Medicare Other | Admitting: Internal Medicine

## 2018-09-14 DIAGNOSIS — I2510 Atherosclerotic heart disease of native coronary artery without angina pectoris: Secondary | ICD-10-CM

## 2018-09-14 DIAGNOSIS — I10 Essential (primary) hypertension: Secondary | ICD-10-CM

## 2018-09-14 DIAGNOSIS — Z9189 Other specified personal risk factors, not elsewhere classified: Secondary | ICD-10-CM

## 2018-09-14 DIAGNOSIS — E118 Type 2 diabetes mellitus with unspecified complications: Secondary | ICD-10-CM | POA: Diagnosis not present

## 2018-09-14 NOTE — Assessment & Plan Note (Addendum)
#  Diabetes - Currently controlled, most recent A1c 6.3% on 06/13/2018, improved from 8.1% on 03/04/2018; Goal A1c <6.5%. Patient reports no hypoglycemic events. Patient reports adherence with medication.  - Continue Synjardy (metformin/empagliflozin) XR 1000-5 mg, 2 tablet daily - Next A1C anticipated  To be in June during CPE (deferred due to COVID 19)

## 2018-09-14 NOTE — Progress Notes (Signed)
Virtual Visit via Video Note  I connected with@ on 09/14/18 at 10:00 AM EDT by a video enabled telemedicine application and verified that I am speaking with the correct person using two identifiers.  Location patient: home Location provider:work  Persons participating in the virtual visit: patient, provider  I discussed the limitations of evaluation and management by telemedicine and the availability of in person appointments. The patient expressed understanding and agreed to proceed. Traci Hall 1) 3 month follow up on diabetes.  Patient has no complaints today.  Patient is following a low glycemic index diet and taking all prescribed medications regularly without side effects.  Since starting he Jardiance she notes that her 2 hour post prandial sugars have been under 140 except when she ate a donut.  Patient is not exercising currently due to the [place] epidemic but she is  intentionally trying to lose weight .  Patient has had an eye exam in the last 12 months and checks feet regularly for signs of infection.  Patient does not walk barefoot outside,  And denies any numbness tingling or burning in feet. Patient is up to date on all recommended vaccinations. Labs to be done at next visit in June same day   2) palpitations:  Secondary to 2 runs of WCT that were identified on her recent 30 day monitor  Each lasting 15 beats or less. Workup for ischemic included a  lexiscan which was  negative for ischemia.  Normal ECHO as well.  She has had one subsequent episode that woke her from sleep but lasted < 30 sec and was not accompanied by sob or dizziness.   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  Repatha recommended and prescribed by [facility] Anon  But patient has not received any call about whether it has been approved  (Prior auth attempts by our office faile)  4) Patient is taking her medications as prescribed and notes low readings for the past week me BP readings have been done about once per week and are   generally < 110/80 .  She is avoiding added salt in her diet and walking regularly about 3 times per week for exercise  .  Traci Hall  ROS: See pertinent positives and negatives per HPI.  Past Medical History:  Diagnosis Date  . Allergic rhinitis   . Asthma   . Barrett's esophagus   . Cancer ([place])   . COPD (chronic obstructive pulmonary disease) ([place])    by prior PFTs  . GERD (gastroesophageal reflux disease)   . Hyperlipidemia   . Hypertension   . Obesity   . OSA on CPAP   . Personal history of tobacco use, presenting hazards to health 01/08/2015  . Polyp of colon     Past Surgical History:  Procedure Laterality Date  . ABDOMINAL HYSTERECTOMY  1991   secondary to endometriosis, and polyps  . APPENDECTOMY  1977  . BREAST BIOPSY Right 02/24/2016   path pending/biopsy  . BREAST SURGERY  03/27/2016   Lymph node removal  . COLONOSCOPY    . ESOPHAGOGASTRODUODENOSCOPY    . ESOPHAGOGASTRODUODENOSCOPY (EGD) WITH PROPOFOL N/A 03/17/2016   Procedure: ESOPHAGOGASTRODUODENOSCOPY (EGD) WITH PROPOFOL;  Surgeon: [name], MD;  Location: [facility];  Service: Endoscopy;  Laterality: N/A;  . LIVER BIOPSY    . NASAL SINUS SURGERY  2008  . SKIN CANCER EXCISION  2015, 2017    Family History  Problem Relation Age of Onset  . Heart disease Father  valvular cardiomyopathy,  mitral valve  . Hyperlipidemia Father   . COPD Mother   . Hypertension Mother   . Emphysema Mother   . Hyperlipidemia Brother   . Stroke Maternal Grandfather   . Hyperlipidemia Brother   . Hypertension Brother   . Hyperlipidemia Brother   . Hypertension Daughter   . Hyperlipidemia Daughter   . Hyperlipidemia Son   . Autism Grandchild   . Cancer Neg Hx     SOCIAL HX: married, retired,    Current Outpatient Medications:  .  albuterol (PROAIR HFA) 108 (90 Base) MCG/ACT inhaler, USE 2 INHALATIONS EVERY MORNING ONLY, Disp: 25.5 g, Rfl: 3 .  ALPRAZolam (XANAX) 0.25 MG tablet, Take 1 tablet (0.25 mg  total) by mouth at bedtime as needed for anxiety., Disp: 30 tablet, Rfl: 0 .  aspirin 81 MG tablet, Take 81 mg by mouth daily., Disp: , Rfl:  .  BENICAR 40 MG tablet, TAKE 1 TABLET DAILY, Disp: 90 tablet, Rfl: 1 .  bimatoprost (LUMIGAN) 0.03 % ophthalmic solution, Place 1 drop into both eyes at bedtime., Disp: , Rfl:  .  BREO ELLIPTA 100-25 MCG/INH AEPB, USE 1 INHALATION DAILY, Disp: 360 each, Rfl: 2 .  Cholecalciferol (VITAMIN D-3) 1000 UNITS CAPS, Take 1 capsule by mouth daily., Disp: , Rfl:  .  Empagliflozin-metFORMIN HCl ER (SYNJARDY XR) 10-998 MG TB24, Take 2 tablets by mouth daily., Disp: 180 tablet, Rfl: 2 .  Evolocumab (REPATHA SURECLICK) 063 MG/ML SOAJ, Inject 140 mg into the skin every 14 (fourteen) days., Disp: 2 pen, Rfl: 11 .  furosemide (LASIX) 20 MG tablet, take 1 tablet by mouth once daily if needed, Disp: 30 tablet, Rfl: 3 .  glucose blood test strip, Use to check blood sugars twice daily. Freestyle lite. Dx: E11.65, Disp: 200 each, Rfl: 3 .  Lancets (FREESTYLE) lancets, Freestyle  Monitor.  For use twice daily  in testing blood sugar: type 2 DM uncontrolled, Disp: 180 each, Rfl: 3 .  loratadine (CLARITIN) 10 MG tablet, Take 10 mg by mouth daily., Disp: , Rfl:  .  Multiple Vitamins-Minerals (CENTRUM SILVER PO), Take by mouth. Take 1 am , Disp: , Rfl:  .  olopatadine (PATANOL) 0.1 % ophthalmic solution, Place 1 drop into both eyes as needed. , Disp: , Rfl:  .  Omega-3 Fatty Acids (EQL OMEGA 3 FISH OIL) 1400 MG CAPS, Take 1,400 mg by mouth daily., Disp: , Rfl:  .  pantoprazole (PROTONIX) 40 MG tablet, TAKE 1 TABLET TWICE A DAY, Disp: 180 tablet, Rfl: 4 .  Polyethyl Glycol-Propyl Glycol (SYSTANE OP), Apply to eye. 1-2 drops in each eye as needed., Disp: , Rfl:  .  vitamin C (ASCORBIC ACID) 500 MG tablet, Take 500 mg by mouth daily., Disp: , Rfl:  .  vitamin E (VITAMIN E) 400 UNIT capsule, Take 400 Units by mouth daily., Disp: , Rfl:   EXAM:  VITALS per patient if  applicable:  GENERAL: alert, oriented, appears well and in no acute distress  HEENT: atraumatic, conjunttiva clear, no obvious abnormalities on inspection of external nose and ears  NECK: normal movements of the head and neck  LUNGS: on inspection no signs of respiratory distress, breathing rate appears normal, no obvious gross SOB, gasping or wheezing  CV: no obvious cyanosis  MS: moves all visible extremities without noticeable abnormality  PSYCH/NEURO: pleasant and cooperative, no obvious depression or anxiety, speech and thought processing grossly intact  ASSESSMENT AND PLAN: DM (diabetes mellitus), type 2 with complications ([place]) #  Diabetes - Currently controlled, most recent A1c 6.3% on 06/13/2018, improved from 8.1% on 03/04/2018; Goal A1c <6.5%. Patient reports no hypoglycemic events. Patient reports adherence with medication.  - Continue Synjardy (metformin/empagliflozin) XR 1000-5 mg, 2 tablet daily - Next A1C anticipated  To be in June during CPE (deferred due to COVID 19)    Essential hypertension Recurrent reports of hypotension addressed with suspension of amlodipine .  Advised to check BP daily and send readings to me  At risk for arrhythmia She has had several brief runs of WCT confirmed on 30 day monitor.  She has no associated cardiomyopathy by ECHO and Lexiscan was negative for ischemic changes. Will consider adding 25 mg Toprol XL as second bp medication if needed after suspension of amlodipine    Updated Medication List Outpatient Encounter Medications as of 09/14/2018  Medication Sig  . albuterol (PROAIR HFA) 108 (90 Base) MCG/ACT inhaler USE 2 INHALATIONS EVERY MORNING ONLY  . ALPRAZolam (XANAX) 0.25 MG tablet Take 1 tablet (0.25 mg total) by mouth at bedtime as needed for anxiety.  Traci Hall aspirin 81 MG tablet Take 81 mg by mouth daily.  Traci Hall BENICAR 40 MG tablet TAKE 1 TABLET DAILY  . bimatoprost (LUMIGAN) 0.03 % ophthalmic solution Place 1 drop into both eyes at  bedtime.  Traci Hall BREO ELLIPTA 100-25 MCG/INH AEPB USE 1 INHALATION DAILY  . Cholecalciferol (VITAMIN D-3) 1000 UNITS CAPS Take 1 capsule by mouth daily.  . Empagliflozin-metFORMIN HCl ER (SYNJARDY XR) 10-998 MG TB24 Take 2 tablets by mouth daily.  . Evolocumab (REPATHA SURECLICK) 250 MG/ML SOAJ Inject 140 mg into the skin every 14 (fourteen) days.  . furosemide (LASIX) 20 MG tablet take 1 tablet by mouth once daily if needed  . glucose blood test strip Use to check blood sugars twice daily. Freestyle lite. Dx: E11.65  . Lancets (FREESTYLE) lancets Freestyle  Monitor.  For use twice daily  in testing blood sugar: type 2 DM uncontrolled  . loratadine (CLARITIN) 10 MG tablet Take 10 mg by mouth daily.  . Multiple Vitamins-Minerals (CENTRUM SILVER PO) Take by mouth. Take 1 am   . olopatadine (PATANOL) 0.1 % ophthalmic solution Place 1 drop into both eyes as needed.   . Omega-3 Fatty Acids (EQL OMEGA 3 FISH OIL) 1400 MG CAPS Take 1,400 mg by mouth daily.  . pantoprazole (PROTONIX) 40 MG tablet TAKE 1 TABLET TWICE A DAY  . Polyethyl Glycol-Propyl Glycol (SYSTANE OP) Apply to eye. 1-2 drops in each eye as needed.  . vitamin C (ASCORBIC ACID) 500 MG tablet Take 500 mg by mouth daily.  . vitamin E (VITAMIN E) 400 UNIT capsule Take 400 Units by mouth daily.  . [DISCONTINUED] amLODipine (NORVASC) 5 MG tablet Take 1 tablet (5 mg total) by mouth daily.  . [DISCONTINUED] Lactobacillus (PROBIOTIC ACIDOPHILUS PO) Take 1 capsule by mouth daily.   No facility-administered encounter medications on file as of 09/14/2018.       I discussed the assessment and treatment plan with the patient. The patient was provided an opportunity to ask questions and all were answered. The patient agreed with the plan and demonstrated an understanding of the instructions.   The patient was advised to call back or seek an in-person evaluation if the symptoms worsen or if the condition fails to improve as anticipated.  I provided 25  minutes of non-face-to-face time during this encounter.   [name], MD

## 2018-09-14 NOTE — Patient Instructions (Signed)
Regarding your blood pressure meds:  DO NOT RESUME AMLODIPIN FOR SBP > 140.  Send me your readings in a week,  I may want to start a ow dose of Toprol XL to prevent your recurrent palpitations,  But your BP will need to be > 149 and your pulse > 70 to do so.

## 2018-09-14 NOTE — Assessment & Plan Note (Addendum)
She has had several brief runs of WCT confirmed on 30 day monitor.  She has no associated cardiomyopathy by ECHO and Lexiscan was negative for ischemic changes. Will consider adding 25 mg Toprol XL as second bp medication if needed after suspension of amlodipine

## 2018-09-14 NOTE — Assessment & Plan Note (Addendum)
Recurrent reports of hypotension addressed with suspension of amlodipine .  Advised to check BP daily and send readings to me

## 2018-09-27 ENCOUNTER — Other Ambulatory Visit: Payer: Self-pay | Admitting: *Deleted

## 2018-09-27 DIAGNOSIS — E7801 Familial hypercholesterolemia: Secondary | ICD-10-CM

## 2018-09-27 DIAGNOSIS — E7800 Pure hypercholesterolemia, unspecified: Secondary | ICD-10-CM

## 2018-10-11 DIAGNOSIS — Z171 Estrogen receptor negative status [ER-]: Secondary | ICD-10-CM | POA: Diagnosis not present

## 2018-10-11 DIAGNOSIS — C50411 Malignant neoplasm of upper-outer quadrant of right female breast: Secondary | ICD-10-CM | POA: Diagnosis not present

## 2018-10-16 ENCOUNTER — Other Ambulatory Visit: Payer: Self-pay | Admitting: Internal Medicine

## 2018-11-07 ENCOUNTER — Other Ambulatory Visit: Payer: Self-pay | Admitting: Cardiovascular Disease

## 2018-11-23 DIAGNOSIS — H401131 Primary open-angle glaucoma, bilateral, mild stage: Secondary | ICD-10-CM | POA: Diagnosis not present

## 2018-12-01 ENCOUNTER — Ambulatory Visit (INDEPENDENT_AMBULATORY_CARE_PROVIDER_SITE_OTHER): Payer: Medicare Other | Admitting: Internal Medicine

## 2018-12-01 ENCOUNTER — Encounter: Payer: Self-pay | Admitting: Internal Medicine

## 2018-12-01 ENCOUNTER — Ambulatory Visit: Payer: Medicare Other | Admitting: Internal Medicine

## 2018-12-01 ENCOUNTER — Other Ambulatory Visit: Payer: Self-pay

## 2018-12-01 DIAGNOSIS — M25552 Pain in left hip: Secondary | ICD-10-CM | POA: Diagnosis not present

## 2018-12-01 DIAGNOSIS — E7801 Familial hypercholesterolemia: Secondary | ICD-10-CM

## 2018-12-01 DIAGNOSIS — E118 Type 2 diabetes mellitus with unspecified complications: Secondary | ICD-10-CM

## 2018-12-01 DIAGNOSIS — I10 Essential (primary) hypertension: Secondary | ICD-10-CM

## 2018-12-01 NOTE — Patient Instructions (Signed)
Your 4 am "episodes" may be due to a low blood sugar.  Set your alarm one night and heck a 4 am sugar to determine if his is the cause  Traci Hall will call you to arrange an afternoon lab/xray appointment

## 2018-12-01 NOTE — Progress Notes (Signed)
Telephone  Note  This visit type was conducted due to national recommendations for restrictions regarding the COVID-19 pandemic (e.g. social distancing).  This format is felt to be most appropriate for this patient at this time.  All issues noted in this document were discussed and addressed.  No physical exam was performed (except for noted visual exam findings with Video Visits).   I connected with@ on 12/01/18 at  9:30 AM EDT by a video enabled telemedicine application or telephone and verified that I am speaking with the correct person using two identifiers. Location patient: home Location provider: work or home office Persons participating in the virtual visit: patient, provider  I discussed the limitations, risks, security and privacy concerns of performing an evaluation and management service by telephone and the availability of in person appointments. I also discussed with the patient that there may be a patient responsible charge related to this service. The patient expressed understanding and agreed to proceed.   Reason for visit: follow up   HPI:  Receiving  acupuncture for back and hip pain  3 month follow up on diabetes.  Patient has no complaints today.  Patient is following a low glycemic index diet and taking all prescribed medications regularly without side effects.  Fasting sugars have been under less than 140 most of the time and post prandials have been under 160 except on rare occasions. Patient is walking for exercise but is somewhat limited by left hip pain that is brought on by walking and relieved by rest. She is intentionally trying to lose weight .  Patient has had an eye exam in the last 12 months and checks feet regularly for signs of infection.  Patient does not walk barefoot outside,  And denies any numbness tingling or burning in feet. Patient is up to date on all recommended vaccinations  Never received repatha despite Dr Tyrell Antonio attempts to obtain  PA  Hypertension: patient checks blood pressure twice weekly at home.  Readings have been for the most part <130/80 at rest . Patient is following a reduced salt diet most days and is taking medications as prescribedPulse is averaging 75.    Continue to have occasional episodes  where she wakes up at  4 am and feels " jumpy"  Pulse is normal. Had a 30 day monitor, , no arrhythmias.  She is  Wearing CPAP, last download was sent  to [employer] ,  New machine.   has not checked BS .     ROS: See pertinent positives and negatives per HPI.  Past Medical History:  Diagnosis Date  . Allergic rhinitis   . Asthma   . Barrett's esophagus   . Cancer ([place])   . COPD (chronic obstructive pulmonary disease) ([place])    by prior PFTs  . GERD (gastroesophageal reflux disease)   . Hyperlipidemia   . Hypertension   . Obesity   . OSA on CPAP   . Personal history of tobacco use, presenting hazards to health 01/08/2015  . Polyp of colon     Past Surgical History:  Procedure Laterality Date  . ABDOMINAL HYSTERECTOMY  1991   secondary to endometriosis, and polyps  . APPENDECTOMY  1977  . BREAST BIOPSY Right 02/24/2016   path pending/biopsy  . BREAST SURGERY  03/27/2016   Lymph node removal  . COLONOSCOPY    . ESOPHAGOGASTRODUODENOSCOPY    . ESOPHAGOGASTRODUODENOSCOPY (EGD) WITH PROPOFOL N/A 03/17/2016   Procedure: ESOPHAGOGASTRODUODENOSCOPY (EGD) WITH PROPOFOL;  Surgeon: [name], MD;  Location: [facility] ENDOSCOPY;  Service: Endoscopy;  Laterality: N/A;  . LIVER BIOPSY    . NASAL SINUS SURGERY  2008  . SKIN CANCER EXCISION  2015, 2017    Family History  Problem Relation Age of Onset  . Heart disease Father        valvular cardiomyopathy,  mitral valve  . Hyperlipidemia Father   . COPD Mother   . Hypertension Mother   . Emphysema Mother   . Hyperlipidemia Brother   . Stroke Maternal Grandfather   . Hyperlipidemia Brother   . Hypertension Brother   . Hyperlipidemia Brother   . Hypertension  Daughter   . Hyperlipidemia Daughter   . Hyperlipidemia Son   . Autism Grandchild   . Cancer Neg Hx     SOCIAL HX:  reports that she quit smoking about 13 years ago. Her smoking use included cigarettes. She has a 67.50 pack-year smoking history. She has never used smokeless tobacco. She reports current alcohol use of about 2.0 standard drinks of alcohol per week. She reports that she does not use drugs.  Current Outpatient Medications:  .  albuterol (PROAIR HFA) 108 (90 Base) MCG/ACT inhaler, USE 2 INHALATIONS EVERY MORNING ONLY, Disp: 25.5 g, Rfl: 3 .  ALPRAZolam (XANAX) 0.25 MG tablet, Take 1 tablet (0.25 mg total) by mouth at bedtime as needed for anxiety., Disp: 30 tablet, Rfl: 0 .  aspirin 81 MG tablet, Take 81 mg by mouth daily., Disp: , Rfl:  .  bimatoprost (LUMIGAN) 0.03 % ophthalmic solution, Place 1 drop into both eyes at bedtime., Disp: , Rfl:  .  BREO ELLIPTA 100-25 MCG/INH AEPB, USE 1 INHALATION DAILY, Disp: 720 each, Rfl: 3 .  Cholecalciferol (VITAMIN D-3) 1000 UNITS CAPS, Take 1 capsule by mouth daily., Disp: , Rfl:  .  Empagliflozin-metFORMIN HCl ER (SYNJARDY XR) 10-998 MG TB24, Take 2 tablets by mouth daily., Disp: 180 tablet, Rfl: 2 .  glucose blood test strip, Use to check blood sugars twice daily. Freestyle lite. Dx: E11.65, Disp: 200 each, Rfl: 3 .  Lancets (FREESTYLE) lancets, Freestyle  Monitor.  For use twice daily  in testing blood sugar: type 2 DM uncontrolled, Disp: 180 each, Rfl: 3 .  loratadine (CLARITIN) 10 MG tablet, Take 10 mg by mouth daily., Disp: , Rfl:  .  Multiple Vitamins-Minerals (CENTRUM SILVER PO), Take by mouth. Take 1 am , Disp: , Rfl:  .  olmesartan (BENICAR) 40 MG tablet, TAKE 1 TABLET DAILY, Disp: 90 tablet, Rfl: 0 .  olopatadine (PATANOL) 0.1 % ophthalmic solution, Place 1 drop into both eyes as needed. , Disp: , Rfl:  .  Omega-3 Fatty Acids (EQL OMEGA 3 FISH OIL) 1400 MG CAPS, Take 1,400 mg by mouth daily., Disp: , Rfl:  .  pantoprazole  (PROTONIX) 40 MG tablet, TAKE 1 TABLET TWICE A DAY, Disp: 180 tablet, Rfl: 4 .  Polyethyl Glycol-Propyl Glycol (SYSTANE OP), Apply to eye. 1-2 drops in each eye as needed., Disp: , Rfl:  .  vitamin C (ASCORBIC ACID) 500 MG tablet, Take 500 mg by mouth daily., Disp: , Rfl:  .  vitamin E (VITAMIN E) 400 UNIT capsule, Take 400 Units by mouth daily., Disp: , Rfl:  .  Evolocumab (REPATHA SURECLICK) 782 MG/ML SOAJ, Inject 140 mg into the skin every 14 (fourteen) days. (Patient not taking: Reported on 12/01/2018), Disp: 2 pen, Rfl: 11  EXAM:    General impression: alert, cooperative and articulate.  No signs of being in distress  Lungs:  speech is fluent sentence length suggests that patient is not short of breath and not punctuated by cough, sneezing or sniffing. Marland Kitchen   Psych: affect normal.  speech is articulate and non pressured .  Denies suicidal thoughts   ASSESSMENT AND PLAN:  DM (diabetes mellitus), type 2 with complications ([place]) most recent A1c 6.3% on 06/13/2018, improved from 8.1% on 03/04/2018; Patient may be having nocturnal  hypoglycemic events and is advised to check a 3 am BS .  Patient reports adherence with medication.  - Continue Synjardy (metformin/empagliflozin) XR 1000-5 mg, 2 tablet daily  Essential hypertension Well controlled on current regimen. Renal function stable, no changes today.  Lab Results  Component Value Date   CREATININE 0.86 06/13/2018     Hyperlipidemia Goal is LDL 70 or less,  She is statin intolerant due to severe muscle cramps.  Repatha accepted by patient but not covered by her insurance.  discussed and trial advised given her known CAD   Left hip pain Unclear if her hip pain is DJD or vascular in nature. Patient was able to assess her distal pulses while on the phone and they were palpable.  Plain films ordered.     I discussed the assessment and treatment plan with the patient. The patient was provided an opportunity to ask questions and all were  answered. The patient agreed with the plan and demonstrated an understanding of the instructions.   The patient was advised to call back or seek an in-person evaluation if the symptoms worsen or if the condition fails to improve as anticipated.  I provided  25 minutes of non-face-to-face time during this encounter.   [name], MD

## 2018-12-02 ENCOUNTER — Telehealth: Payer: Self-pay

## 2018-12-02 DIAGNOSIS — H401131 Primary open-angle glaucoma, bilateral, mild stage: Secondary | ICD-10-CM | POA: Diagnosis not present

## 2018-12-02 LAB — HM DIABETES EYE EXAM

## 2018-12-02 NOTE — Progress Notes (Signed)
LMTCB

## 2018-12-02 NOTE — Telephone Encounter (Signed)
Copied from [place] [id number]. Topic: General - Other >> Dec 02, 2018 12:03 PM Leward Quan A wrote: Reason for CRM: Patient called to speak to Janett Billow stated that she called her earlier but she was not able to speak to her at the time. Per patient she say please send her a My Chart message with the appointment for next week and if she need to make any changes she will. Patient have another appointment at 2 pm today  Ph# [phone]

## 2018-12-03 DIAGNOSIS — M25552 Pain in left hip: Secondary | ICD-10-CM | POA: Insufficient documentation

## 2018-12-03 NOTE — Assessment & Plan Note (Signed)
Unclear if her hip pain is DJD or vascular in nature. Patient was able to assess her distal pulses while on the phone and they were palpable.  Plain films ordered.

## 2018-12-03 NOTE — Assessment & Plan Note (Signed)
most recent A1c 6.3% on 06/13/2018, improved from 8.1% on 03/04/2018; Patient may be having nocturnal  hypoglycemic events and is advised to check a 3 am BS .  Patient reports adherence with medication.  - Continue Synjardy (metformin/empagliflozin) XR 1000-5 mg, 2 tablet daily

## 2018-12-03 NOTE — Assessment & Plan Note (Signed)
Well controlled on current regimen. Renal function stable, no changes today.  Lab Results  Component Value Date   CREATININE 0.86 06/13/2018

## 2018-12-03 NOTE — Assessment & Plan Note (Signed)
Goal is LDL 70 or less,  She is statin intolerant due to severe muscle cramps.  Repatha accepted by patient but not covered by her insurance.  discussed and trial advised given her known CAD

## 2018-12-05 ENCOUNTER — Telehealth: Payer: Self-pay

## 2018-12-05 DIAGNOSIS — L700 Acne vulgaris: Secondary | ICD-10-CM | POA: Diagnosis not present

## 2018-12-05 DIAGNOSIS — L812 Freckles: Secondary | ICD-10-CM | POA: Diagnosis not present

## 2018-12-05 DIAGNOSIS — D239 Other benign neoplasm of skin, unspecified: Secondary | ICD-10-CM | POA: Diagnosis not present

## 2018-12-05 DIAGNOSIS — L918 Other hypertrophic disorders of the skin: Secondary | ICD-10-CM | POA: Diagnosis not present

## 2018-12-05 DIAGNOSIS — L300 Nummular dermatitis: Secondary | ICD-10-CM | POA: Diagnosis not present

## 2018-12-05 DIAGNOSIS — Z85828 Personal history of other malignant neoplasm of skin: Secondary | ICD-10-CM | POA: Diagnosis not present

## 2018-12-05 DIAGNOSIS — Z1283 Encounter for screening for malignant neoplasm of skin: Secondary | ICD-10-CM | POA: Diagnosis not present

## 2018-12-05 DIAGNOSIS — D1800 Hemangioma unspecified site: Secondary | ICD-10-CM | POA: Diagnosis not present

## 2018-12-05 NOTE — Telephone Encounter (Signed)
-----   Message from [name], MD sent at 12/01/2018 10:07 AM EDT ----- Janett Billow will call you to arrange an afternoon lab/xray appointment

## 2018-12-05 NOTE — Telephone Encounter (Signed)
LMTCB. Need to schedule pt for a lab and xray appt.

## 2018-12-08 NOTE — Telephone Encounter (Signed)
Lab appt has been scheduled.  

## 2018-12-12 ENCOUNTER — Other Ambulatory Visit: Payer: Self-pay

## 2018-12-12 ENCOUNTER — Other Ambulatory Visit (INDEPENDENT_AMBULATORY_CARE_PROVIDER_SITE_OTHER): Payer: Medicare Other

## 2018-12-12 ENCOUNTER — Ambulatory Visit (INDEPENDENT_AMBULATORY_CARE_PROVIDER_SITE_OTHER): Payer: Medicare Other

## 2018-12-12 DIAGNOSIS — M25552 Pain in left hip: Secondary | ICD-10-CM | POA: Diagnosis not present

## 2018-12-12 DIAGNOSIS — E118 Type 2 diabetes mellitus with unspecified complications: Secondary | ICD-10-CM

## 2018-12-12 DIAGNOSIS — E7801 Familial hypercholesterolemia: Secondary | ICD-10-CM | POA: Diagnosis not present

## 2018-12-12 LAB — COMPREHENSIVE METABOLIC PANEL
ALT: 31 U/L (ref 0–35)
AST: 23 U/L (ref 0–37)
Albumin: 4.1 g/dL (ref 3.5–5.2)
Alkaline Phosphatase: 57 U/L (ref 39–117)
BUN: 14 mg/dL (ref 6–23)
CO2: 27 mEq/L (ref 19–32)
Calcium: 9.1 mg/dL (ref 8.4–10.5)
Chloride: 104 mEq/L (ref 96–112)
Creatinine, Ser: 0.89 mg/dL (ref 0.40–1.20)
GFR: 62.16 mL/min (ref >60.00)
Glucose, Bld: 102 mg/dL — ABNORMAL HIGH (ref 70–99)
Potassium: 4.3 mEq/L (ref 3.5–5.1)
Sodium: 139 mEq/L (ref 135–145)
Total Bilirubin: 0.4 mg/dL (ref 0.2–1.2)
Total Protein: 6.6 g/dL (ref 6.0–8.3)

## 2018-12-12 LAB — LIPID PANEL
Cholesterol: 275 mg/dL — ABNORMAL HIGH (ref 0–200)
HDL: 51.6 mg/dL (ref >39.00)
LDL Cholesterol: 193 mg/dL — ABNORMAL HIGH (ref 0–99)
NonHDL: 223.63
Total CHOL/HDL Ratio: 5
Triglycerides: 152 mg/dL — ABNORMAL HIGH (ref 0.0–149.0)
VLDL: 30.4 mg/dL (ref 0.0–40.0)

## 2018-12-12 LAB — HEMOGLOBIN A1C: Hgb A1c MFr Bld: 6.1 % (ref 4.6–6.5)

## 2018-12-19 ENCOUNTER — Telehealth: Payer: Self-pay | Admitting: Cardiovascular Disease

## 2018-12-19 NOTE — Telephone Encounter (Signed)
Pt c/o medication issue:  1. Name of Medication: Repatha   2. How are you currently taking this medication (dosage and times per day)? 140 mg/mL INJ  q 14 days   3. Are you having a reaction (difficulty breathing--STAT)? No   4. What is your medication issue? Needs Prior Auth

## 2018-12-20 NOTE — Telephone Encounter (Signed)
Pt dropped off another copy of her BP and sugar readings  Placed in Dr. Demetrios Isaacs color folder upfront

## 2018-12-21 ENCOUNTER — Telehealth: Payer: Self-pay | Admitting: *Deleted

## 2018-12-21 NOTE — Telephone Encounter (Signed)
[employer] [facility] is reviewing your PA request and will respond within 24 hours for Medicaid or up to 72 hours for non-Medicaid plans, based on the required timeframe determined by state or federal regulations. 

## 2018-12-21 NOTE — Telephone Encounter (Signed)
Pt requiring PA for Repatha 140 inj. Submitted PA through Covermymeds awaiting approval. [facility] is reviewing your PA request and will respond within 24 hours for Medicaid or up to 72 hours for non-Medicaid plans, based on the required timeframe determined by state or federal regulations

## 2019-01-06 ENCOUNTER — Telehealth: Payer: Self-pay | Admitting: Cardiovascular Disease

## 2019-01-06 NOTE — Telephone Encounter (Signed)
Patient received prior approval for Repatha SureClick 751 mg/mlmedication, however the approval is for a 30 day rather than a 90 day. Patient would like if we could contact [employer] and have them change it to a 90 day supply

## 2019-01-10 ENCOUNTER — Other Ambulatory Visit: Payer: Self-pay | Admitting: *Deleted

## 2019-01-10 MED ORDER — REPATHA SURECLICK 140 MG/ML ~~LOC~~ SOAJ: 140.0000 mg | SUBCUTANEOUS | 1 refills | Status: DC

## 2019-01-10 NOTE — Telephone Encounter (Signed)
Requested Prescriptions   Signed Prescriptions Disp Refills  . Evolocumab (REPATHA SURECLICK) 383 MG/ML SOAJ 6 pen 1    Sig: Inject 140 mg into the skin every 14 (fourteen) days.    Authorizing Provider: [name] A    Ordering User: Britt Bottom   New Rx sent to [facility] for [place].

## 2019-01-10 NOTE — Telephone Encounter (Signed)
Requested Prescriptions   Signed Prescriptions Disp Refills  . Evolocumab (REPATHA SURECLICK) 301 MG/ML SOAJ 6 pen 1    Sig: Inject 140 mg into the skin every 14 (fourteen) days.    Authorizing Provider: [name] A    Ordering User: Britt Bottom

## 2019-01-10 NOTE — Telephone Encounter (Signed)
Spoke with Ivin Booty with [employer] [facility] regarding a 90 day supply for Repatha.  I have been transferred to the pharmacy for a verbal Rx for [place].

## 2019-01-17 ENCOUNTER — Telehealth: Payer: Self-pay | Admitting: Internal Medicine

## 2019-01-17 NOTE — Telephone Encounter (Signed)
Ok. Thank you.

## 2019-01-17 NOTE — Telephone Encounter (Signed)
Nurse visit for a teaching

## 2019-01-17 NOTE — Telephone Encounter (Signed)
Pt called about wanting to sch a appt to learn how to inject herself. Is this supposed to be a nurse visit appt or a office appt with Dr Derrel Nip.? Please advise?   Call pt @ [phone] Thanks.

## 2019-01-25 ENCOUNTER — Ambulatory Visit: Payer: Medicare Other

## 2019-01-25 ENCOUNTER — Other Ambulatory Visit: Payer: Self-pay

## 2019-01-25 DIAGNOSIS — E7801 Familial hypercholesterolemia: Secondary | ICD-10-CM

## 2019-01-27 ENCOUNTER — Telehealth: Payer: Self-pay | Admitting: *Deleted

## 2019-01-27 DIAGNOSIS — Z87891 Personal history of nicotine dependence: Secondary | ICD-10-CM

## 2019-01-27 DIAGNOSIS — Z122 Encounter for screening for malignant neoplasm of respiratory organs: Secondary | ICD-10-CM

## 2019-01-27 NOTE — Telephone Encounter (Signed)
Patient has been notified that annual lung cancer screening low dose CT scan is due currently or will be in near future. Confirmed that patient is within the age range of 55-77, and asymptomatic, (no signs or symptoms of lung cancer). Patient denies illness that would prevent curative treatment for lung cancer if found. Verified smoking history, (former, quit 2007, 50 pack year). The shared decision making visit was done 12/22/13. Patient is agreeable for CT scan being scheduled.

## 2019-02-02 ENCOUNTER — Other Ambulatory Visit: Payer: Self-pay

## 2019-02-02 ENCOUNTER — Ambulatory Visit
Admission: RE | Admit: 2019-02-02 | Discharge: 2019-02-02 | Disposition: A | Discharge status: Home / Self Care | Payer: Medicare Other | Source: Ambulatory Visit | Attending: Oncology | Admitting: Oncology

## 2019-02-02 DIAGNOSIS — Z122 Encounter for screening for malignant neoplasm of respiratory organs: Secondary | ICD-10-CM | POA: Insufficient documentation

## 2019-02-02 DIAGNOSIS — Z87891 Personal history of nicotine dependence: Secondary | ICD-10-CM | POA: Diagnosis not present

## 2019-02-05 ENCOUNTER — Other Ambulatory Visit: Payer: Self-pay | Admitting: Cardiovascular Disease

## 2019-02-06 ENCOUNTER — Other Ambulatory Visit: Payer: Self-pay | Admitting: *Deleted

## 2019-02-06 ENCOUNTER — Telehealth: Payer: Self-pay | Admitting: *Deleted

## 2019-02-06 DIAGNOSIS — Z122 Encounter for screening for malignant neoplasm of respiratory organs: Secondary | ICD-10-CM

## 2019-02-06 DIAGNOSIS — R918 Other nonspecific abnormal finding of lung field: Secondary | ICD-10-CM

## 2019-02-06 DIAGNOSIS — Z87891 Personal history of nicotine dependence: Secondary | ICD-10-CM

## 2019-02-06 NOTE — Telephone Encounter (Signed)
Notified patient of LDCT lung cancer screening program results with recommendation for 3 month follow up imaging. Also notified of incidental findings noted below and is encouraged to discuss further with PCP who will receive a copy of this note and/or the CT report. Patient verbalizes understanding.   IMPRESSION: 1. Lung-RADS 4A. Follow up low-dose chest CT without contrast in 3 months (please use the following order, "CT CHEST LCS NODULE FOLLOW-UP W/O CM") is recommended. Multiple new solid and sub solid pulmonary nodules identified. These may be infectious/inflammatory but given the history of breast cancer, metastatic disease is not excluded. Short-term follow-up in 6 months is recommended with repeat low-dose chest CT without contrast (please use the following order, "CT CHEST LCS NODULE FOLLOW-UP W/O CM"). 2.  Emphysema. (ICD10-J43.9) 3.  Aortic Atherosclerois (ICD10-170.0)

## 2019-02-09 ENCOUNTER — Ambulatory Visit (INDEPENDENT_AMBULATORY_CARE_PROVIDER_SITE_OTHER): Payer: Medicare Other | Admitting: Cardiovascular Disease

## 2019-02-09 ENCOUNTER — Other Ambulatory Visit: Payer: Self-pay

## 2019-02-09 ENCOUNTER — Encounter: Payer: Self-pay | Admitting: Cardiovascular Disease

## 2019-02-09 VITALS — BP 150/80 | HR 82 | Ht 62.0 in | Wt 174.0 lb

## 2019-02-09 DIAGNOSIS — E7801 Familial hypercholesterolemia: Secondary | ICD-10-CM | POA: Diagnosis not present

## 2019-02-09 DIAGNOSIS — I10 Essential (primary) hypertension: Secondary | ICD-10-CM

## 2019-02-09 DIAGNOSIS — I4729 Other ventricular tachycardia: Secondary | ICD-10-CM

## 2019-02-09 DIAGNOSIS — I472 Ventricular tachycardia: Secondary | ICD-10-CM

## 2019-02-09 DIAGNOSIS — I2510 Atherosclerotic heart disease of native coronary artery without angina pectoris: Secondary | ICD-10-CM | POA: Diagnosis not present

## 2019-02-09 NOTE — Progress Notes (Signed)
Cardiology Office Note   Date:  02/09/2019   ID:  Traci Hall, [name] 05/02/1946, MRN [id number]  PCP:  Traci Mc, MD  Cardiologist:   Traci Sacramento, MD   Chief Complaint  Patient presents with  . Other    6 month follow up. [name] denies chest pain and SOB. Meds reviewed verbally with patient.       History of Present Illness: Traci Hall is a 73 y.o. female who presents for  a follow-up visit regarding  coronary atherosclerosis noted on CT scan of the chest and  palpitations.  She has known history of type 2 diabetes, previous tobacco use with mild COPD with an asthmatic component, essential hypertension, sleep apnea on CPAP, GERD, hyperlipidemia with intolerance to statins, breast cancer and obesity. She quit smoking in 2007. She has no family history of premature coronary artery disease.  30-day monitor done in February of this year for palpitations showed normal sinus rhythm with no evidence of atrial fibrillation.  She was noted to have 2 episodes of wide-complex tachycardia suggestive of ventricular tachycardia.  The first 1 lasted 13 beats and the other was 15 beats.  No other significant arrhythmia.  Due to that, she underwent a Lexiscan Myoview in March which showed no evidence of ischemia with normal ejection fraction. Echocardiogram was also done which showed an EF of 60 to 65% with mild LVH and no significant valvular abnormalities.  He has been doing well with no recent chest pain or worsening dyspnea.  No palpitations.  Repatha was finally approved and she took 1 injection so far.  No side effects.   Past Medical History:  Diagnosis Date  . Allergic rhinitis   . Asthma   . Barrett's esophagus   . Cancer ([place])   . COPD (chronic obstructive pulmonary disease) ([place])    by prior PFTs  . GERD (gastroesophageal reflux disease)   . Hyperlipidemia   . Hypertension   . Obesity   . OSA on CPAP   . Personal history of tobacco use, presenting hazards to  health 01/08/2015  . Polyp of colon     Past Surgical History:  Procedure Laterality Date  . ABDOMINAL HYSTERECTOMY  1991   secondary to endometriosis, and polyps  . APPENDECTOMY  1977  . BREAST BIOPSY Right 02/24/2016   path pending/biopsy  . BREAST SURGERY  03/27/2016   Lymph node removal  . COLONOSCOPY    . ESOPHAGOGASTRODUODENOSCOPY    . ESOPHAGOGASTRODUODENOSCOPY (EGD) WITH PROPOFOL N/A 03/17/2016   Procedure: ESOPHAGOGASTRODUODENOSCOPY (EGD) WITH PROPOFOL;  Surgeon: Traci Sails, MD;  Location: [facility] ENDOSCOPY;  Service: Endoscopy;  Laterality: N/A;  . LIVER BIOPSY    . NASAL SINUS SURGERY  2008  . SKIN CANCER EXCISION  2015, 2017     Current Outpatient Medications  Medication Sig Dispense Refill  . albuterol (PROAIR HFA) 108 (90 Base) MCG/ACT inhaler USE 2 INHALATIONS EVERY MORNING ONLY 25.5 g 3  . aspirin 81 MG tablet Take 81 mg by mouth daily.    . bimatoprost (LUMIGAN) 0.03 % ophthalmic solution Place 1 drop into both eyes at bedtime.    Marland Kitchen BREO ELLIPTA 100-25 MCG/INH AEPB USE 1 INHALATION DAILY 720 each 3  . Cholecalciferol (VITAMIN D-3) 1000 UNITS CAPS Take 1 capsule by mouth daily.    . Empagliflozin-metFORMIN HCl ER (SYNJARDY XR) 10-998 MG TB24 Take 2 tablets by mouth daily. 180 tablet 2  . Evolocumab (REPATHA SURECLICK) XX123456 MG/ML SOAJ Inject 140 mg  into the skin every 14 (fourteen) days. 6 pen 1  . glucose blood test strip Use to check blood sugars twice daily. Freestyle lite. Dx: E11.65 200 each 3  . Lancets (FREESTYLE) lancets Freestyle  Monitor.  For use twice daily  in testing blood sugar: type 2 DM uncontrolled 180 each 3  . loratadine (CLARITIN) 10 MG tablet Take 10 mg by mouth daily.    . Multiple Vitamins-Minerals (CENTRUM SILVER PO) Take by mouth. Take 1 am     . olmesartan (BENICAR) 40 MG tablet TAKE 1 TABLET DAILY 90 tablet 3  . olopatadine (PATANOL) 0.1 % ophthalmic solution Place 1 drop into both eyes as needed.     . Omega-3 Fatty Acids (EQL OMEGA 3  FISH OIL) 1400 MG CAPS Take 1,400 mg by mouth daily.    . pantoprazole (PROTONIX) 40 MG tablet TAKE 1 TABLET TWICE A DAY 180 tablet 4  . Polyethyl Glycol-Propyl Glycol (SYSTANE OP) Apply to eye. 1-2 drops in each eye as needed.    . vitamin C (ASCORBIC ACID) 500 MG tablet Take 500 mg by mouth daily.    . vitamin E (VITAMIN E) 400 UNIT capsule Take 400 Units by mouth daily.     No current facility-administered medications for this visit.     Allergies:   Alendronate sodium, Demerol, Naproxen, and Statins    Social History:  The patient  reports that she quit smoking about 13 years ago. Her smoking use included cigarettes. She has a 67.50 pack-year smoking history. She has never used smokeless tobacco. She reports current alcohol use of about 2.0 standard drinks of alcohol per week. She reports that she does not use drugs.   Family History:  The patient's family history includes Autism in her grandchild; COPD in her mother; Emphysema in her mother; Heart disease in her father; Hyperlipidemia in her brother, brother, brother, daughter, father, and son; Hypertension in her brother, daughter, and mother; Stroke in her maternal grandfather.    ROS:  Please see the history of present illness.   Otherwise, review of systems are positive for none.   All other systems are reviewed and negative.    PHYSICAL EXAM: VS:  BP (!) 150/80 (BP Location: Left Arm, Patient Position: Sitting, Cuff Size: Normal)   Pulse 82   Ht 5\' 2"  (1.575 m)   Wt 174 lb (78.9 kg)   BMI 31.83 kg/m  , BMI Body mass index is 31.83 kg/m. GEN: Well nourished, well developed, in no acute distress  HEENT: normal  Neck: no JVD, carotid bruits, or masses Cardiac: RRR; no rubs, or gallops,no edema . 1/6 systolic ejection murmur at the base of the heart Respiratory:  clear to auscultation bilaterally, normal work of breathing GI: soft, nontender, nondistended, + BS MS: no deformity or atrophy  Skin: warm and dry, no rash Neuro:   Strength and sensation are intact Psych: euthymic mood, full affect   EKG:  EKG is ordered today. The ekg ordered today demonstrates normal sinus rhythm with no significant ST or T wave changes.  Recent Labs: 06/13/2018: Magnesium 1.6; TSH 4.36 12/12/2018: ALT 31; BUN 14; Creatinine, Ser 0.89; Potassium 4.3; Sodium 139    Lipid Panel    Component Value Date/Time   CHOL 275 (H) 12/12/2018 1043   CHOL 216 (H) 04/12/2015 0837   TRIG 152.0 (H) 12/12/2018 1043   HDL 51.60 12/12/2018 1043   HDL 51 04/12/2015 0837   CHOLHDL 5 12/12/2018 1043   VLDL 30.4 12/12/2018  1043   LDLCALC 193 (H) 12/12/2018 1043   LDLCALC 131 (H) 04/12/2015 0837   LDLDIRECT 240.0 06/13/2018 1014      Wt Readings from Last 3 Encounters:  02/09/19 174 lb (78.9 kg)  02/02/19 171 lb (77.6 kg)  08/11/18 175 lb (79.4 kg)        No flowsheet data found.    ASSESSMENT AND PLAN:  1.  Coronary atherosclerosis without angina: The patient has known history of coronary atherosclerosis.  Currently with no anginal symptoms.  Continue aggressive medical therapy.  2.  Nonsustained ventricular tachycardia: No evidence of ischemia and normal ejection fraction on echo.  In addition, she has no recurrent symptoms.  No further work-up of this is needed at the present time.   3. Essential hypertension: Blood pressure is mildly elevated today but that is unusual for her.  Continue to monitor for now.  4.  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  Given coronary atherosclerosis and diabetes, recommend a target LDL of less than 70.  She was finally approved on Repatha and started the medication.  Recheck labs in September.  She does have elevated triglyceride.  However, I think the priority should be noted to bring her LDL down.   Disposition:   FU with me in 6 months  Signed,  Traci Sacramento, MD  02/09/2019 10:51 AM    [place]

## 2019-02-09 NOTE — Patient Instructions (Signed)
Medication Instructions:  Your physician recommends that you continue on your current medications as directed. Please refer to the Current Medication list given to you today.  If you need a refill on your cardiac medications before your next appointment, please call your pharmacy.   Lab work: None ordered If you have labs (blood work) drawn today and your tests are completely normal, you will receive your results only by: . MyChart Message (if you have MyChart) OR . A paper copy in the mail If you have any lab test that is abnormal or we need to change your treatment, we will call you to review the results.  Testing/Procedures: None ordered  Follow-Up: At [facility], you and your health needs are our priority.  As part of our continuing mission to provide you with exceptional heart care, we have created designated Provider Care Teams.  These Care Teams include your primary Cardiologist (physician) and Advanced Practice Providers (APPs -  Physician Assistants and Nurse Practitioners) who all work together to provide you with the care you need, when you need it. You will need a follow up appointment in 6 months.  Please call our office 2 months in advance to schedule this appointment.  You may see Dr. Arida  or one of the following Advanced Practice Providers on your designated Care Team:   Christopher Berge, NP Ryan Dunn, PA-C . Jacquelyn Visser, PA-C  Any Other Special Instructions Will Be Listed Below (If Applicable). N/A   

## 2019-02-17 ENCOUNTER — Other Ambulatory Visit: Payer: Self-pay

## 2019-02-17 ENCOUNTER — Ambulatory Visit (INDEPENDENT_AMBULATORY_CARE_PROVIDER_SITE_OTHER): Payer: Medicare Other

## 2019-02-17 DIAGNOSIS — Z23 Encounter for immunization: Secondary | ICD-10-CM

## 2019-02-17 MED ORDER — ALBUTEROL SULFATE HFA 108 (90 BASE) MCG/ACT IN AERS: INHALATION_SPRAY | RESPIRATORY_TRACT | 3 refills | Status: DC

## 2019-02-22 DIAGNOSIS — Z8719 Personal history of other diseases of the digestive system: Secondary | ICD-10-CM | POA: Diagnosis not present

## 2019-02-22 DIAGNOSIS — I472 Ventricular tachycardia: Secondary | ICD-10-CM | POA: Diagnosis not present

## 2019-02-22 DIAGNOSIS — E119 Type 2 diabetes mellitus without complications: Secondary | ICD-10-CM | POA: Diagnosis not present

## 2019-02-22 DIAGNOSIS — K760 Fatty (change of) liver, not elsewhere classified: Secondary | ICD-10-CM | POA: Diagnosis not present

## 2019-02-22 DIAGNOSIS — K210 Gastro-esophageal reflux disease with esophagitis: Secondary | ICD-10-CM | POA: Diagnosis not present

## 2019-02-24 NOTE — Progress Notes (Signed)
Patient presented today for teaching on Rapatha pen injection.  Verlee Monte, RN Clinic Lead teach patient on how to give self-injection.  Discussed injection schedule.  Patient bought in her Rx and self-injected in upper leg using her own medication.  Patient voiced understanding of teaching.

## 2019-03-01 ENCOUNTER — Other Ambulatory Visit: Payer: Self-pay

## 2019-03-01 ENCOUNTER — Ambulatory Visit (INDEPENDENT_AMBULATORY_CARE_PROVIDER_SITE_OTHER): Payer: Medicare Other | Admitting: Family Medicine

## 2019-03-01 DIAGNOSIS — J209 Acute bronchitis, unspecified: Secondary | ICD-10-CM | POA: Diagnosis not present

## 2019-03-01 DIAGNOSIS — J440 Chronic obstructive pulmonary disease with acute lower respiratory infection: Secondary | ICD-10-CM

## 2019-03-01 MED ORDER — PREDNISONE 10 MG (21) PO TBPK: ORAL_TABLET | ORAL | 0 refills | Status: DC

## 2019-03-01 NOTE — Progress Notes (Signed)
Patient ID: Traci Hall, female   DOB: 1945-11-30, 73 y.o.   MRN: [id number]    Virtual Visit via video Note  This visit type was conducted due to national recommendations for restrictions regarding the COVID-19 pandemic (e.g. social distancing).  This format is felt to be most appropriate for this patient at this time.  All issues noted in this document were discussed and addressed.  No physical exam was performed (except for noted visual exam findings with Video Visits).   I connected with Traci Hall today at  9:00 AM EDT by a video enabled telemedicine application and verified that I am speaking with the correct person using two identifiers. Location patient: home Location provider: work or home office Persons participating in the virtual visit: patient, provider  I discussed the limitations, risks, security and privacy concerns of performing an evaluation and management service by video and the availability of in person appointments. I also discussed with the patient that there may be a patient responsible charge related to this service. The patient expressed understanding and agreed to proceed.   HPI:   Patient and I connected via video due to complaint of increased cough and burning sensation in Her lungs.  Patient does have a history of COPD and allergies.  Patient states she was in the garage 2 days ago cleaning out, and ended up inhaling a good amount of dust.  After going back into the house, noticed that she was coughing more.  She does take her Breo daily and uses albuterol inhaler usually once per day or once every other day every a.m.  Denies feeling more short of breath than usual or any audible wheezing.  Denies phlegm with cough, states cough is mainly dry.  Denies any fevers or chills, has been monitoring her temperature and temperature stays between 97 to 13 F  States she does not have any concerns for coming into contact with someone who has been sick with  COVID-19.  States she and her husband mainly remain at home and avoid going out as much as possible.  If she does leave the home, she is diligent with handwashing and wearing her mask.  Patient's current symptoms feel very similar to a bronchitis/COPD exacerbation she has had before due to allergen triggers.    ROS: See pertinent positives and negatives per HPI.  Past Medical History:  Diagnosis Date  . Allergic rhinitis   . Asthma   . Barrett's esophagus   . Cancer ([place])   . COPD (chronic obstructive pulmonary disease) ([place])    by prior PFTs  . GERD (gastroesophageal reflux disease)   . Hyperlipidemia   . Hypertension   . Obesity   . OSA on CPAP   . Personal history of tobacco use, presenting hazards to health 01/08/2015  . Polyp of colon     Past Surgical History:  Procedure Laterality Date  . ABDOMINAL HYSTERECTOMY  1991   secondary to endometriosis, and polyps  . APPENDECTOMY  1977  . BREAST BIOPSY Right 02/24/2016   path pending/biopsy  . BREAST SURGERY  03/27/2016   Lymph node removal  . COLONOSCOPY    . ESOPHAGOGASTRODUODENOSCOPY    . ESOPHAGOGASTRODUODENOSCOPY (EGD) WITH PROPOFOL N/A 03/17/2016   Procedure: ESOPHAGOGASTRODUODENOSCOPY (EGD) WITH PROPOFOL;  Surgeon: [name], MD;  Location[facility]COPY;  Service: Endoscopy;  Laterality: N/A;  . LIVER BIOPSY    . NASAL SINUS SURGERY  2008  . SKIN CANCER EXCISION  2015, 2017  Family History  Problem Relation Age of Onset  . Heart disease Father        valvular cardiomyopathy,  mitral valve  . Hyperlipidemia Father   . COPD Mother   . Hypertension Mother   . Emphysema Mother   . Hyperlipidemia Brother   . Stroke Maternal Grandfather   . Hyperlipidemia Brother   . Hypertension Brother   . Hyperlipidemia Brother   . Hypertension Daughter   . Hyperlipidemia Daughter   . Hyperlipidemia Son   . Autism Grandchild   . Cancer Neg Hx    Social History   Tobacco Use  . Smoking status: Former  Smoker    Packs/day: 1.50    Years: 45.00    Pack years: 67.50    Types: Cigarettes    Quit date: 06/15/2005    Years since quitting: 13.7  . Smokeless tobacco: Never Used  Substance Use Topics  . Alcohol use: Yes    Alcohol/week: 2.0 standard drinks    Types: 2 Glasses of wine per week    Comment: with meals/social    Current Outpatient Medications:  .  albuterol (PROAIR HFA) 108 (90 Base) MCG/ACT inhaler, USE 2 INHALATIONS EVERY MORNING ONLY, Disp: 25.5 g, Rfl: 3 .  aspirin 81 MG tablet, Take 81 mg by mouth daily., Disp: , Rfl:  .  bimatoprost (LUMIGAN) 0.03 % ophthalmic solution, Place 1 drop into both eyes at bedtime., Disp: , Rfl:  .  BREO ELLIPTA 100-25 MCG/INH AEPB, USE 1 INHALATION DAILY, Disp: 720 each, Rfl: 3 .  Cholecalciferol (VITAMIN D-3) 1000 UNITS CAPS, Take 1 capsule by mouth daily., Disp: , Rfl:  .  Empagliflozin-metFORMIN HCl ER (SYNJARDY XR) 10-998 MG TB24, Take 2 tablets by mouth daily., Disp: 180 tablet, Rfl: 2 .  Evolocumab (REPATHA SURECLICK) [id number] MG/ML SOAJ, Inject 140 mg into the skin every 14 (fourteen) days., Disp: 6 pen, Rfl: 1 .  glucose blood test strip, Use to check blood sugars twice daily. Freestyle lite. Dx: E11.65, Disp: 200 each, Rfl: 3 .  Lancets (FREESTYLE) lancets, Freestyle  Monitor.  For use twice daily  in testing blood sugar: type 2 DM uncontrolled, Disp: 180 each, Rfl: 3 .  loratadine (CLARITIN) 10 MG tablet, Take 10 mg by mouth daily., Disp: , Rfl:  .  Multiple Vitamins-Minerals (CENTRUM SILVER PO), Take by mouth. Take 1 am , Disp: , Rfl:  .  olmesartan (BENICAR) 40 MG tablet, TAKE 1 TABLET DAILY, Disp: 90 tablet, Rfl: 3 .  olopatadine (PATANOL) 0.1 % ophthalmic solution, Place 1 drop into both eyes as needed. , Disp: , Rfl:  .  Omega-3 Fatty Acids (EQL OMEGA 3 FISH OIL) 1400 MG CAPS, Take 1,400 mg by mouth daily., Disp: , Rfl:  .  pantoprazole (PROTONIX) 40 MG tablet, TAKE 1 TABLET TWICE A DAY, Disp: 180 tablet, Rfl: 4 .  Polyethyl  Glycol-Propyl Glycol (SYSTANE OP), Apply to eye. 1-2 drops in each eye as needed., Disp: , Rfl:  .  vitamin C (ASCORBIC ACID) 500 MG tablet, Take 500 mg by mouth daily., Disp: , Rfl:  .  vitamin E (VITAMIN E) 400 UNIT capsule, Take 400 Units by mouth daily., Disp: , Rfl:   EXAM:  GENERAL: alert, oriented, appears well and in no acute distress  HEENT: atraumatic, conjunttiva clear, no obvious abnormalities on inspection of external nose and ears  NECK: normal movements of the head and neck  LUNGS: on inspection no signs of respiratory distress, breathing rate appears normal, no obvious  gross SOB, gasping or wheezing.  CV: no obvious cyanosis  MS: moves all visible extremities without noticeable abnormality  PSYCH/NEURO: pleasant and cooperative, no obvious depression or anxiety, speech and thought processing grossly intact  ASSESSMENT AND PLAN:  Discussed the following assessment and plan:  1. Acute bronchitis with COPD [facility]) Patient will take oral steroid taper.  She will continue Breo daily and increase albuterol to 2 puffs twice per day and as needed for the next 7 days, then go back down to using albuterol only as needed.  Advised to continue taking oral antihistamine daily to help reduce allergen response.  Advised to monitor self for any worsening symptoms including shortness of breath, wheezing, phlegm with cough, chest pain, fever or chills and let us know anything changes.  Discussed possibly getting tested for COVID-19, declines testing at this time but states she would go and get tested if her symptoms were to worsen in any way; declines covid19 testing at this time.  - predniSONE (STERAPRED UNI-PAK 21 TAB) 10 MG (21) TBPK tablet; Take according to pack instructions  Dispense: 21 tablet; Refill: 0    I discussed the assessment and treatment plan with the patient. The patient was provided an opportunity to ask questions and all were answered. The patient agreed with the plan  and demonstrated an understanding of the instructions.   The patient was advised to call back or seek an in-person evaluation if the symptoms worsen or if the condition fails to improve as anticipated.   Jodelle Green, FNP

## 2019-03-06 DIAGNOSIS — Z171 Estrogen receptor negative status [ER-]: Secondary | ICD-10-CM | POA: Diagnosis not present

## 2019-03-06 DIAGNOSIS — C50411 Malignant neoplasm of upper-outer quadrant of right female breast: Secondary | ICD-10-CM | POA: Diagnosis not present

## 2019-03-07 ENCOUNTER — Other Ambulatory Visit: Payer: Self-pay

## 2019-03-07 ENCOUNTER — Other Ambulatory Visit: Payer: Self-pay | Admitting: Internal Medicine

## 2019-03-07 DIAGNOSIS — E1165 Type 2 diabetes mellitus with hyperglycemia: Secondary | ICD-10-CM

## 2019-03-08 ENCOUNTER — Ambulatory Visit (INDEPENDENT_AMBULATORY_CARE_PROVIDER_SITE_OTHER): Payer: Medicare Other

## 2019-03-08 DIAGNOSIS — Z0000 Encounter for general adult medical examination without abnormal findings: Secondary | ICD-10-CM

## 2019-03-08 NOTE — Patient Instructions (Addendum)
  Ms. Traci Hall , Thank you for taking time to come for your Medicare Wellness Visit. I appreciate your ongoing commitment to your health goals. Please review the following plan we discussed and let me know if I can assist you in the future.   These are the goals we discussed: Goals      Patient Stated   . Increase physical activity (pt-stated)     Walking Chair exercises.         Other   . Increase physical activity     Stationary bike for exercise 5 days weekly, 50min       This is a list of the screening recommended for you and due dates:  Health Maintenance  Topic Date Due  . Complete foot exam   03/05/2019  . Hemoglobin A1C  06/13/2019  . Mammogram  07/07/2019  . Eye exam for diabetics  12/02/2019  . Colon Cancer Screening  03/07/2022  . Tetanus Vaccine  07/21/2022  . Flu Shot  Completed  . DEXA scan (bone density measurement)  Completed  .  Hepatitis C: One time screening is recommended by Center for Disease Control  (CDC) for  adults born from [birth year] through 1965.   Completed  . Pneumonia vaccines  Completed

## 2019-03-08 NOTE — Progress Notes (Signed)
Subjective:   Traci Hall is a 73 y.o. female who presents for Medicare Annual (Subsequent) preventive examination.  Review of Systems:  No ROS.  Medicare Wellness Virtual Visit.  Visual/audio telehealth visit, UTA vital signs.   See social history for additional risk factors.   Cardiac Risk Factors include: advanced age (>64men, >68 women);hypertension;diabetes mellitus     Objective:     Vitals: There were no vitals taken for this visit.  There is no height or weight on file to calculate BMI.  Advanced Directives 03/08/2019 11/29/2017 11/26/2016 07/14/2016 07/14/2016 03/17/2016  Does Patient Have a Medical Advance Directive? Yes Yes Yes Yes Yes Yes  Type of Paramedic of [place];Living will [place];Living will [place];Living will Healthcare Power of [place];Living will -  Does patient want to make changes to medical advance directive? No - Patient declined No - Patient declined No - Patient declined No - Patient declined No - Patient declined -  Copy of [place] in Chart? No - copy requested No - copy requested No - copy requested No - copy requested No - copy requested -    Tobacco Social History   Tobacco Use  Smoking Status Former Smoker  . Packs/day: 1.50  . Years: 45.00  . Pack years: 67.50  . Types: Cigarettes  . Quit date: 06/15/2005  . Years since quitting: 13.7  Smokeless Tobacco Never Used     Counseling given: Not Answered   Clinical Intake:  Pre-visit preparation completed: Yes        Diabetes: Yes(Followed by pcp)  How often do you need to have someone help you when you read instructions, pamphlets, or other written materials from your doctor or pharmacy?: 1 - Never  Interpreter Needed?: No     Past Medical History:  Diagnosis Date  . Allergic rhinitis   . Asthma   . Barrett's esophagus   . Cancer ([place])   . COPD (chronic  obstructive pulmonary disease) ([place])    by prior PFTs  . GERD (gastroesophageal reflux disease)   . Hyperlipidemia   . Hypertension   . Obesity   . OSA on CPAP   . Personal history of tobacco use, presenting hazards to health 01/08/2015  . Polyp of colon    Past Surgical History:  Procedure Laterality Date  . ABDOMINAL HYSTERECTOMY  1991   secondary to endometriosis, and polyps  . APPENDECTOMY  1977  . BREAST BIOPSY Right 02/24/2016   path pending/biopsy  . BREAST SURGERY  03/27/2016   Lymph node removal  . COLONOSCOPY    . ESOPHAGOGASTRODUODENOSCOPY    . ESOPHAGOGASTRODUODENOSCOPY (EGD) WITH PROPOFOL N/A 03/17/2016   Procedure: ESOPHAGOGASTRODUODENOSCOPY (EGD) WITH PROPOFOL;  Surgeon: [name], MD;  Location: [facility];  Service: Endoscopy;  Laterality: N/A;  . LIVER BIOPSY    . NASAL SINUS SURGERY  2008  . SKIN CANCER EXCISION  2015, 2017   Family History  Problem Relation Age of Onset  . Heart disease Father        valvular cardiomyopathy,  mitral valve  . Hyperlipidemia Father   . COPD Mother   . Hypertension Mother   . Emphysema Mother   . Hyperlipidemia Brother   . Stroke Maternal Grandfather   . Hyperlipidemia Brother   . Hypertension Brother   . Hyperlipidemia Brother   . Hypertension Daughter   . Hyperlipidemia Daughter   . Hyperlipidemia Son   .  Autism Grandchild   . Cancer Neg Hx    Social History   Socioeconomic History  . Marital status: Married    Spouse name: Not on file  . Number of children: 2  . Years of education: Not on file  . Highest education level: Not on file  Occupational History  . Occupation: retired   Scientific laboratory technician  . Financial resource strain: Not hard at all  . Food insecurity    Worry: Never true    Inability: Never true  . Transportation needs    Medical: No    Non-medical: No  Tobacco Use  . Smoking status: Former Smoker    Packs/day: 1.50    Years: 45.00    Pack years: 67.50    Types: Cigarettes     Quit date: 06/15/2005    Years since quitting: 13.7  . Smokeless tobacco: Never Used  Substance and Sexual Activity  . Alcohol use: Yes    Alcohol/week: 2.0 standard drinks    Types: 2 Glasses of wine per week    Comment: with meals/social  . Drug use: No  . Sexual activity: Yes    Birth control/protection: Post-menopausal  Lifestyle  . Physical activity    Days per week: 0 days    Minutes per session: Not on file  . Stress: Not on file  Relationships  . Social Herbalist on phone: Not on file    Gets together: Not on file    Attends religious service: Not on file    Active member of club or organization: Not on file    Attends meetings of clubs or organizations: Not on file    Relationship status: Not on file  Other Topics Concern  . Not on file  Social History Narrative  . Not on file    Outpatient Encounter Medications as of 03/08/2019  Medication Sig  . albuterol (PROAIR HFA) 108 (90 Base) MCG/ACT inhaler USE 2 INHALATIONS EVERY MORNING ONLY  . aspirin 81 MG tablet Take 81 mg by mouth daily.  . bimatoprost (LUMIGAN) 0.03 % ophthalmic solution Place 1 drop into both eyes at bedtime.  Marland Kitchen BREO ELLIPTA 100-25 MCG/INH AEPB USE 1 INHALATION DAILY  . Cholecalciferol (VITAMIN D-3) 1000 UNITS CAPS Take 1 capsule by mouth daily.  . Evolocumab (REPATHA SURECLICK) XX123456 MG/ML SOAJ Inject 140 mg into the skin every 14 (fourteen) days.  . Evolocumab (REPATHA SURECLICK) XX123456 MG/ML SOAJ every fourteen (14) days.  Marland Kitchen glucose blood test strip Use to check blood sugars twice daily. Freestyle lite. Dx: E11.65  . Lancets (FREESTYLE) lancets Freestyle  Monitor.  For use twice daily  in testing blood sugar: type 2 DM uncontrolled  . loratadine (CLARITIN) 10 MG tablet Take 10 mg by mouth daily.  . Multiple Vitamins-Minerals (CENTRUM SILVER PO) Take by mouth. Take 1 am   . olmesartan (BENICAR) 40 MG tablet TAKE 1 TABLET DAILY  . olopatadine (PATANOL) 0.1 % ophthalmic solution Place 1 drop  into both eyes as needed.   . Omega-3 Fatty Acids (EQL OMEGA 3 FISH OIL) 1400 MG CAPS Take 1,400 mg by mouth daily.  . pantoprazole (PROTONIX) 40 MG tablet TAKE 1 TABLET TWICE A DAY  . Polyethyl Glycol-Propyl Glycol (SYSTANE OP) Apply to eye. 1-2 drops in each eye as needed.  . predniSONE (STERAPRED UNI-PAK 21 TAB) 10 MG (21) TBPK tablet Take according to pack instructions  . SYNJARDY XR 10-998 MG TB24 TAKE 2 TABLETS DAILY (TO REPLACE METFORMIN)  .  vitamin C (ASCORBIC ACID) 500 MG tablet Take 500 mg by mouth daily.  . vitamin E (VITAMIN E) 400 UNIT capsule Take 400 Units by mouth daily.   No facility-administered encounter medications on file as of 03/08/2019.     Activities of Daily Living In your present state of health, do you have any difficulty performing the following activities: 03/08/2019  Hearing? N  Vision? N  Difficulty concentrating or making decisions? N  Walking or climbing stairs? N  Dressing or bathing? N  Doing errands, shopping? N  Preparing Food and eating ? N  Using the Toilet? N  In the past six months, have you accidently leaked urine? N  Do you have problems with loss of bowel control? N  Managing your Medications? N  Managing your Finances? N  Housekeeping or managing your Housekeeping? N  Some recent data might be hidden    Patient Care Team: [name], MD as PCP - General (Internal Medicine) Bary Castilla, Forest Gleason, MD (General Surgery) [name], MD (Internal Medicine)    Assessment:   This is a routine wellness examination for Traci Hall.  I connected with patient 03/08/19 at 10:00 AM EDT by an audio enabled telemedicine application and verified that I am speaking with the correct person using two identifiers. Patient stated full name and DOB. Patient gave permission to continue with virtual visit. Patient's location was at home and Nurse's location was at [place] office.   Health Maintenance Due: Hgb A1c- 12/12/18 (6.1) See completed HM at the end  of note.   Eye: Visual acuity not assessed. Virtual visit. Followed by their ophthalmologist every 12 months.  Retinopathy- none reported  Dental: Visits every 6 months.    Hearing: Hearing aids- yes  Safety:  Patient feels safe at home- yes Patient does have smoke detectors at home- yes Patient does wear sunscreen or protective clothing when in direct sunlight - yes Patient does wear seat belt when in a moving vehicle - yes Patient drives- yes Adequate lighting in walkways free from debris- yes Grab bars and handrails used as appropriate- yes Ambulates with no assistive device Cell phone on person when ambulating outside of the home- yes  Social: Alcohol intake - yes      Smoking history- former    Smokers in home? none Illicit drug use? none  Depression: PHQ 2 &9 complete. See screening below. Denies irritability, anhedonia, sadness/tearfullness.  Stable.   Falls: See screening below.    Medication: Taking as directed and without issues.   Covid-19: Precautions and sickness symptoms discussed. Wears mask, social distancing, hand hygiene as appropriate.   Activities of Daily Living Patient denies needing assistance with: household chores, feeding themselves, getting from bed to chair, getting to the toilet, bathing/showering, dressing, managing money, or preparing meals.   Memory: Patient is alert. Patient denies difficulty focusing or concentrating. Correctly identified the president of the [place], season and recall. Patient likes to read for brain stimulation.  BMI- discussed the importance of a healthy diet, water intake and the benefits of aerobic exercise.  Educational material provided.  Physical activity- walking, no routine.  Encouraged to stay active up to 150 minutes per week.  Diet:  Diabetic Water: good intake Caffeine: 1-2 cups Ensure/Protein supplement: yes  Advanced Directive: End of life planning; Advance aging; Advanced directives discussed.   Copy of current HCPOA/Living Will requested.    Other Providers Patient Care Team: [name], MD as PCP - General (Internal Medicine) Robert Bellow,  MD (General Surgery) [name], MD (Internal Medicine)  Exercise Activities and Dietary recommendations Current Exercise Habits: Home exercise routine, Type of exercise: walking, Intensity: Mild  Goals      Patient Stated   . Increase physical activity (pt-stated)     Walking Chair exercises.         Other   . Increase physical activity     Stationary bike for exercise 5 days weekly, 38min       Fall Risk Fall Risk  03/08/2019 03/01/2019 11/29/2017 11/29/2017 03/03/2017  Falls in the past year? 0 0 No No No  Number falls in past yr: - 0 - - -  Injury with Fall? - 0 - - -  Follow up - Falls evaluation completed - - -   Timed Get Up and Go performed: no, virtual visit  Depression Screen PHQ 2/9 Scores 03/08/2019 03/01/2019 11/29/2017 11/26/2016  PHQ - 2 Score 0 0 0 0  PHQ- 9 Score 0 0 1 0     Cognitive Function MMSE - Mini Mental State Exam 11/29/2017 11/26/2016  Orientation to time 5 5  Orientation to Place 5 5  Registration 3 3  Attention/ Calculation 5 5  Recall 3 3  Language- name 2 objects 2 2  Language- repeat 1 1  Language- follow 3 step command 3 3  Language- read & follow direction 1 1  Write a sentence 1 1  Copy design 1 1  Total score 30 30     6CIT Screen 03/08/2019  What Year? 0 points  What month? 0 points  What time? 0 points  Count back from 20 0 points  Months in reverse 0 points  Repeat phrase 0 points  Total Score 0    Immunization History  Administered Date(s) Administered  . Fluad Quad(high Dose 65+) 02/17/2019  . Hep A / Hep B 05/19/2013, 06/20/2013, 11/17/2013  . Influenza Split 03/21/2014  . Influenza Whole 02/23/2011, 03/07/2012  . Influenza, High Dose Seasonal PF 03/03/2017, 03/04/2018  . Influenza-Unspecified 03/09/2013, 02/28/2015, 02/14/2016  . Pneumococcal  Conjugate-13 03/07/2009, 08/14/2013  . Pneumococcal Polysaccharide-23 03/07/2009, 08/08/2015  . Tdap 07/21/2012  . Zoster 06/16/2007   Screening Tests Health Maintenance  Topic Date Due  . FOOT EXAM  03/05/2019  . HEMOGLOBIN A1C  06/13/2019  . MAMMOGRAM  07/07/2019  . OPHTHALMOLOGY EXAM  12/02/2019  . COLONOSCOPY  03/07/2022  . TETANUS/TDAP  07/21/2022  . INFLUENZA VACCINE  Completed  . DEXA SCAN  Completed  . Hepatitis C Screening  Completed  . PNA vac Low Risk Adult  Completed      Plan:    Keep all routine maintenance appointments.   Cpe 03/09/19 @ 9:30.   Medicare Attestation I have personally reviewed: The patient's medical and social history Their use of alcohol, tobacco or illicit drugs Their current medications and supplements The patient's functional ability including ADLs,fall risks, home safety risks, cognitive, and hearing and visual impairment Diet and physical activities Evidence for depression   In addition, I have reviewed and discussed with patient certain preventive protocols, quality metrics, and best practice recommendations. A written personalized care plan for preventive services as well as general preventive health recommendations were provided to patient via mail.     Varney Biles, LPN  [id number]

## 2019-03-09 ENCOUNTER — Ambulatory Visit: Payer: Medicare Other

## 2019-03-09 ENCOUNTER — Other Ambulatory Visit: Payer: Self-pay

## 2019-03-09 ENCOUNTER — Encounter: Payer: Self-pay | Admitting: Internal Medicine

## 2019-03-09 ENCOUNTER — Ambulatory Visit (INDEPENDENT_AMBULATORY_CARE_PROVIDER_SITE_OTHER): Payer: Medicare Other | Admitting: Internal Medicine

## 2019-03-09 VITALS — BP 150/80 | HR 72 | Temp 97.4°F | Resp 15 | Ht 62.0 in | Wt 175.2 lb

## 2019-03-09 DIAGNOSIS — K760 Fatty (change of) liver, not elsewhere classified: Secondary | ICD-10-CM | POA: Diagnosis not present

## 2019-03-09 DIAGNOSIS — E78019 Familial hypercholesterolemia, unspecified: Secondary | ICD-10-CM

## 2019-03-09 DIAGNOSIS — R102 Pelvic and perineal pain unspecified side: Secondary | ICD-10-CM

## 2019-03-09 DIAGNOSIS — E785 Hyperlipidemia, unspecified: Secondary | ICD-10-CM | POA: Diagnosis not present

## 2019-03-09 DIAGNOSIS — J4489 Other specified chronic obstructive pulmonary disease: Secondary | ICD-10-CM

## 2019-03-09 DIAGNOSIS — R911 Solitary pulmonary nodule: Secondary | ICD-10-CM | POA: Diagnosis not present

## 2019-03-09 DIAGNOSIS — R232 Flushing: Secondary | ICD-10-CM

## 2019-03-09 DIAGNOSIS — I2510 Atherosclerotic heart disease of native coronary artery without angina pectoris: Secondary | ICD-10-CM

## 2019-03-09 DIAGNOSIS — Z9189 Other specified personal risk factors, not elsewhere classified: Secondary | ICD-10-CM | POA: Diagnosis not present

## 2019-03-09 DIAGNOSIS — J449 Chronic obstructive pulmonary disease, unspecified: Secondary | ICD-10-CM | POA: Diagnosis not present

## 2019-03-09 DIAGNOSIS — R918 Other nonspecific abnormal finding of lung field: Secondary | ICD-10-CM

## 2019-03-09 DIAGNOSIS — C50411 Malignant neoplasm of upper-outer quadrant of right female breast: Secondary | ICD-10-CM

## 2019-03-09 DIAGNOSIS — Z171 Estrogen receptor negative status [ER-]: Secondary | ICD-10-CM

## 2019-03-09 DIAGNOSIS — N838 Other noninflammatory disorders of ovary, fallopian tube and broad ligament: Secondary | ICD-10-CM | POA: Diagnosis not present

## 2019-03-09 DIAGNOSIS — E1169 Type 2 diabetes mellitus with other specified complication: Secondary | ICD-10-CM

## 2019-03-09 DIAGNOSIS — J440 Chronic obstructive pulmonary disease with acute lower respiratory infection: Secondary | ICD-10-CM

## 2019-03-09 DIAGNOSIS — R140 Abdominal distension (gaseous): Secondary | ICD-10-CM | POA: Diagnosis not present

## 2019-03-09 DIAGNOSIS — E7801 Familial hypercholesterolemia: Secondary | ICD-10-CM | POA: Diagnosis not present

## 2019-03-09 DIAGNOSIS — J209 Acute bronchitis, unspecified: Secondary | ICD-10-CM

## 2019-03-09 DIAGNOSIS — R61 Generalized hyperhidrosis: Secondary | ICD-10-CM

## 2019-03-09 LAB — COMPREHENSIVE METABOLIC PANEL
ALT: 24 U/L (ref 0–35)
AST: 16 U/L (ref 0–37)
Albumin: 3.9 g/dL (ref 3.5–5.2)
Alkaline Phosphatase: 58 U/L (ref 39–117)
BUN: 13 mg/dL (ref 6–23)
CO2: 28 mEq/L (ref 19–32)
Calcium: 9.5 mg/dL (ref 8.4–10.5)
Chloride: 104 mEq/L (ref 96–112)
Creatinine, Ser: 0.86 mg/dL (ref 0.40–1.20)
GFR: 64.63 mL/min (ref >60.00)
Glucose, Bld: 92 mg/dL (ref 70–99)
Potassium: 4.4 mEq/L (ref 3.5–5.1)
Sodium: 140 mEq/L (ref 135–145)
Total Bilirubin: 0.5 mg/dL (ref 0.2–1.2)
Total Protein: 6 g/dL (ref 6.0–8.3)

## 2019-03-09 LAB — CBC WITH DIFFERENTIAL/PLATELET
Basophils Absolute: 0 10*3/uL (ref 0.0–0.1)
Basophils Relative: 0.6 % (ref 0.0–3.0)
Eosinophils Absolute: 0.2 10*3/uL (ref 0.0–0.7)
Eosinophils Relative: 2.9 % (ref 0.0–5.0)
HCT: 39.7 % (ref 36.0–46.0)
Hemoglobin: 12.8 g/dL (ref 12.0–15.0)
Lymphocytes Relative: 31.9 % (ref 12.0–46.0)
Lymphs Abs: 2.6 10*3/uL (ref 0.7–4.0)
MCHC: 32.3 g/dL (ref 30.0–36.0)
MCV: 90.9 fl (ref 78.0–100.0)
Monocytes Absolute: 0.6 10*3/uL (ref 0.1–1.0)
Monocytes Relative: 7.2 % (ref 3.0–12.0)
Neutro Abs: 4.6 10*3/uL (ref 1.4–7.7)
Neutrophils Relative %: 57.4 % (ref 43.0–77.0)
Platelets: 210 10*3/uL (ref 150.0–400.0)
RBC: 4.36 Mil/uL (ref 3.87–5.11)
RDW: 14.8 % (ref 11.5–15.5)
WBC: 8 10*3/uL (ref 4.0–10.5)

## 2019-03-09 LAB — LIPID PANEL
Cholesterol: 137 mg/dL (ref 0–200)
HDL: 61.3 mg/dL (ref >39.00)
LDL Cholesterol: 48 mg/dL (ref 0–99)
NonHDL: 75.93
Total CHOL/HDL Ratio: 2
Triglycerides: 142 mg/dL (ref 0.0–149.0)
VLDL: 28.4 mg/dL (ref 0.0–40.0)

## 2019-03-09 LAB — TSH: TSH: 4.08 u[IU]/mL (ref 0.35–4.50)

## 2019-03-09 NOTE — Assessment & Plan Note (Signed)
She  had several brief runs of WCT confirmed on 30 day monitor several months ago.  She has no associated cardiomyopathy by ECHO and Lexiscan was negative for ischemic changes. No runs in 2 weeks,  Not taking a beta blocker.

## 2019-03-09 NOTE — Assessment & Plan Note (Addendum)
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 LDL is 48 and LFTs are normal . No changes today  Lab Results  Component Value Date   CHOL 137 03/09/2019   HDL 61.30 03/09/2019   LDLCALC 48 03/09/2019   LDLDIRECT 240.0 06/13/2018   TRIG 142.0 03/09/2019   CHOLHDL 2 03/09/2019

## 2019-03-09 NOTE — Assessment & Plan Note (Addendum)
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 She has no peripheral eosinophilia by today's CBC .  referring to Dr Patsey Berthold .  Recent exacerbation treated by NP with prednisone taper. Continue LABA/ICS

## 2019-03-09 NOTE — Assessment & Plan Note (Signed)
Noted on recent annual lung CT.  3 month follow up CT advised and scheduled in November 2020

## 2019-03-09 NOTE — Patient Instructions (Signed)
Please schedule a nurse visit in one week for a POC A1c  and blood pressure check   I have ordered additional tests to investigate your hot flashes and rule out tumors that can cause them    Health Maintenance After Age 73 After age 36, you are at a higher risk for certain long-term diseases and infections as well as injuries from falls. Falls are a major cause of broken bones and head injuries in people who are older than age 40. Getting regular preventive care can help to keep you healthy and well. Preventive care includes getting regular testing and making lifestyle changes as recommended by your health care provider. Talk with your health care provider about:  Which screenings and tests you should have. A screening is a test that checks for a disease when you have no symptoms.  A diet and exercise plan that is right for you. What should I know about screenings and tests to prevent falls? Screening and testing are the best ways to find a health problem early. Early diagnosis and treatment give you the best chance of managing medical conditions that are common after age 43. Certain conditions and lifestyle choices may make you more likely to have a fall. Your health care provider may recommend:  Regular vision checks. Poor vision and conditions such as cataracts can make you more likely to have a fall. If you wear glasses, make sure to get your prescription updated if your vision changes.  Medicine review. Work with your health care provider to regularly review all of the medicines you are taking, including over-the-counter medicines. Ask your health care provider about any side effects that may make you more likely to have a fall. Tell your health care provider if any medicines that you take make you feel dizzy or sleepy.  Osteoporosis screening. Osteoporosis is a condition that causes the bones to get weaker. This can make the bones weak and cause them to break more easily.  Blood pressure  screening. Blood pressure changes and medicines to control blood pressure can make you feel dizzy.  Strength and balance checks. Your health care provider may recommend certain tests to check your strength and balance while standing, walking, or changing positions.  Foot health exam. Foot pain and numbness, as well as not wearing proper footwear, can make you more likely to have a fall.  Depression screening. You may be more likely to have a fall if you have a fear of falling, feel emotionally low, or feel unable to do activities that you used to do.  Alcohol use screening. Using too much alcohol can affect your balance and may make you more likely to have a fall. What actions can I take to lower my risk of falls? General instructions  Talk with your health care provider about your risks for falling. Tell your health care provider if: ? You fall. Be sure to tell your health care provider about all falls, even ones that seem minor. ? You feel dizzy, sleepy, or off-balance.  Take over-the-counter and prescription medicines only as told by your health care provider. These include any supplements.  Eat a healthy diet and maintain a healthy weight. A healthy diet includes low-fat dairy products, low-fat (lean) meats, and fiber from whole grains, beans, and lots of fruits and vegetables. Home safety  Remove any tripping hazards, such as rugs, cords, and clutter.  Install safety equipment such as grab bars in bathrooms and safety rails on stairs.  Keep rooms and  walkways well-lit. Activity   Follow a regular exercise program to stay fit. This will help you maintain your balance. Ask your health care provider what types of exercise are appropriate for you.  If you need a cane or walker, use it as recommended by your health care provider.  Wear supportive shoes that have nonskid soles. Lifestyle  Do not drink alcohol if your health care provider tells you not to drink.  If you drink  alcohol, limit how much you have: ? 0-1 drink a day for women. ? 0-2 drinks a day for men.  Be aware of how much alcohol is in your drink. In the [place] one drink equals one typical bottle of beer (12 oz), one-half glass of wine (5 oz), or one shot of hard liquor (1 oz).  Do not use any products that contain nicotine or tobacco, such as cigarettes and e-cigarettes. If you need help quitting, ask your health care provider. Summary  Having a healthy lifestyle and getting preventive care can help to protect your health and wellness after age 69.  Screening and testing are the best way to find a health problem early and help you avoid having a fall. Early diagnosis and treatment give you the best chance for managing medical conditions that are more common for people who are older than age 14.  Falls are a major cause of broken bones and head injuries in people who are older than age 26. Take precautions to prevent a fall at home.  Work with your health care provider to learn what changes you can make to improve your health and wellness and to prevent falls. This information is not intended to replace advice given to you by your health care provider. Make sure you discuss any questions you have with your health care provider. Document Released: 04/14/2017 Document Revised: 09/22/2018 Document Reviewed: 04/14/2017 Elsevier Patient Education  2020 [employer].

## 2019-03-09 NOTE — Assessment & Plan Note (Signed)
She has completed lumpectomy , Chemo and XRT .  6 month follow up with [facility] oncology planned.

## 2019-03-09 NOTE — Assessment & Plan Note (Addendum)
Presumed by ultrasound changes and serologies negative for autoimmune causes of hepatitis.  Current liver enzymes are normal and all modifiable risk factors including obesity, diabetes and hyperlipidemia have been addressed.  She is taking metformin, fish oil and is statin intolerant.   Lab Results  Component Value Date   ALT 24 03/09/2019   AST 16 03/09/2019   ALKPHOS 58 03/09/2019   BILITOT 0.5 03/09/2019

## 2019-03-09 NOTE — Progress Notes (Signed)
Patient ID: Traci Hall, female    DOB: 19-Apr-1946  Age: 73 y.o. MRN: Traci Hall  The patient is here for evaluation  and management of other chronic and acute problems.  .  Lung ca screening August 2020 CT .   Marland Kitchen Colonoscopy : 2013  Tics,  10 yr follow up   DEXA;  Last done in 2016 . 021Discussed repeating in 2   The risk factors are reflected in the social history.  The roster of all physicians providing medical care to patient - is listed in the Snapshot section of the chart.  Activities of daily living:  The patient is 100% independent in all ADLs: dressing, toileting, feeding as well as independent mobility  Home safety : The patient has smoke detectors in the home. They wear seatbelts.  There are no firearms at home. There is no violence in the home.   There is no risks for hepatitis, STDs or HIV. There is no   history of blood transfusion. They have no travel history to infectious disease endemic areas of the world.  The patient has seen their dentist in the last six month. They have seen their eye doctor in the last year. They admit to slight hearing difficulty with regard to whispered voices and some television programs.  They have deferred audiologic testing in the last year.  They do not  have excessive sun exposure. Discussed the need for sun protection: hats, long sleeves and use of sunscreen if there is significant sun exposure.   Diet: the importance of a healthy diet is discussed. They do have a healthy diet.  The benefits of regular aerobic exercise were discussed. She walks 4 times per week ,  20 minutes.   Depression screen: there are no signs or vegative symptoms of depression- irritability, change in appetite, anhedonia, sadness/tearfullness.  Cognitive assessment: the patient manages all their financial and personal affairs and is actively engaged. They could relate day,date,year and events; recalled 2/3 objects at 3 minutes; performed clock-face test  normally.  The following portions of the patient's history were reviewed and updated as appropriate: allergies, current medications, past family history, past medical history,  past surgical history, past social history  and problem list.  Visual acuity was not assessed per patient preference since she has regular follow up with her ophthalmologist. Hearing and body mass index were assessed and reviewed.   During the course of the visit the patient was educated and counseled about appropriate screening and preventive services including : fall prevention , diabetes screening, nutrition counseling, colorectal cancer screening, and recommended immunizations.    CC: The primary encounter diagnosis was Pelvic pain in female. Diagnoses of Acute bronchitis with COPD (Traci Hall), Nonalcoholic fatty liver disease without nonalcoholic steatohepatitis (NASH), Hyperlipidemia associated with type 2 diabetes mellitus (Traci Hall), COPD, Pulmonary nodule, Flushing reaction, Abdominal distension, Ovarian mass, At risk for arrhythmia, Familial hypercholesterolemia, Abnormal flushing and sweating, Malignant neoplasm of upper-outer quadrant of right breast in female, estrogen receptor negative (Traci Hall), and Multiple pulmonary nodules were also pertinent to this visit.  Breast cancer:  She has completed  Treatment and is now undergoing surveillance at 6 monhth intervals by Traci Hall oncology  August 2020, new pulmonary nodules bilaterally.  Repeat at 3 month interval and CT lungs set up Nov 23.    Centrilobular emphysema:  Secondary to 50 pack yr history of tobacco abuse.  DR Traci Hall is leaving.  Discussed referral to Dr Traci Hall. Most recent exacerbation treated by NP as  acute bronchitis  Not completely  resolved, finished prednisone on Monday  Continues to have Burning in right lung with deep breath,  No coughing , no fevers .  No sputum .  Using mucinex DM. Using albuterol and LABA/ICS ,  No LAMA. PFTS 2018 "no significant obst or  restrictive disease" despite findings of centrilobular emphysema and radiation fibrosis on chest CT   DM:  Started Synjardia 6 months ago.  Sugars remain labile and "uncontrolled." however she reports that the highest she has seen is 160 post prandial. Last a1c < 6.5 in June.   Cc:  Has been having  drenching hot flashes  Intermittently since menopause began over  15 years ago.  Now occurring at least 3 times per week described as "over the top"   Has ruled out hypoglycemia as a cause. No unintentional weight loss .   HTN:  Checks BP at home and pressures are lower than in office today  . Needs to bring in home BP cuff for RN viist.      WCT: noted on 30 day onitor in April .  Has not had any palpitations  In 2 weeks   Started repatha dose #4 this weekend   Wears hearing aides  Traci Hall  Last eye exam in June no retinopathy.  On lumigan for glaucoma/    History Traci Hall has a past medical history of Allergic rhinitis, Asthma, Barrett's esophagus, Cancer ([place]), COPD (chronic obstructive pulmonary disease) (Traci Hall), GERD (gastroesophageal reflux disease), Hyperlipidemia, Hypertension, Obesity, OSA on CPAP, Personal history of tobacco use, presenting hazards to health (01/08/2015), and Polyp of colon.   She has a past surgical history that includes Nasal sinus surgery (2008); Appendectomy (1977); Abdominal hysterectomy (1991); Skin cancer excision (2015, 2017); Colonoscopy; Liver biopsy; Esophagogastroduodenoscopy; Esophagogastroduodenoscopy (egd) with propofol (N/A, 03/17/2016); Breast biopsy (Right, 02/24/2016); and Breast surgery (03/27/2016).   Her family history includes Autism in her grandchild; COPD in her mother; Emphysema in her mother; Heart disease in her father; Hyperlipidemia in her brother, brother, brother, daughter, father, and son; Hypertension in her brother, daughter, and mother; Stroke in her maternal grandfather.She reports that she quit smoking about 13 years ago. Her smoking  use included cigarettes. She has a 67.50 pack-year smoking history. She has never used smokeless tobacco. She reports current alcohol use of about 2.0 standard drinks of alcohol per week. She reports that she does not use drugs.  Outpatient Medications Prior to Visit  Medication Sig Dispense Refill  . albuterol (PROAIR HFA) 108 (90 Base) MCG/ACT inhaler USE 2 INHALATIONS EVERY MORNING ONLY 25.5 g 3  . aspirin 81 MG tablet Take 81 mg by mouth daily.    . bimatoprost (LUMIGAN) 0.03 % ophthalmic solution Place 1 drop into both eyes at bedtime.    Marland Kitchen BREO ELLIPTA 100-25 MCG/INH AEPB USE 1 INHALATION DAILY 720 each 3  . Cholecalciferol (VITAMIN D-3) 1000 UNITS CAPS Take 1 capsule by mouth daily.    . Evolocumab (REPATHA SURECLICK) XX123456 MG/ML SOAJ Inject 140 mg into the skin every 14 (fourteen) days. 6 pen 1  . glucose blood test strip Use to check blood sugars twice daily. Freestyle lite. Dx: E11.65 200 each 3  . Lancets (FREESTYLE) lancets Freestyle  Monitor.  For use twice daily  in testing blood sugar: type 2 DM uncontrolled 180 each 3  . loratadine (CLARITIN) 10 MG tablet Take 10 mg by mouth daily.    . Multiple Vitamins-Minerals (CENTRUM SILVER PO) Take by mouth. Take 1 am     .  olmesartan (BENICAR) 40 MG tablet TAKE 1 TABLET DAILY 90 tablet 3  . olopatadine (PATANOL) 0.1 % ophthalmic solution Place 1 drop into both eyes as needed.     . Omega-3 Fatty Acids (EQL OMEGA 3 FISH OIL) 1400 MG CAPS Take 1,400 mg by mouth daily.    . pantoprazole (PROTONIX) 40 MG tablet TAKE 1 TABLET TWICE A DAY 180 tablet 4  . Polyethyl Glycol-Propyl Glycol (SYSTANE OP) Apply to eye. 1-2 drops in each eye as needed.    Marland Kitchen SYNJARDY XR 10-998 MG TB24 TAKE 2 TABLETS DAILY (TO REPLACE METFORMIN) 180 tablet 3  . vitamin C (ASCORBIC ACID) 500 MG tablet Take 500 mg by mouth daily.    . vitamin E (VITAMIN E) 400 UNIT capsule Take 400 Units by mouth daily.    . Evolocumab (REPATHA SURECLICK) XX123456 MG/ML SOAJ every fourteen (14)  days.    . predniSONE (STERAPRED UNI-PAK 21 TAB) 10 MG (21) TBPK tablet Take according to pack instructions (Patient not taking: Reported on 03/09/2019) 21 tablet 0   No facility-administered medications prior to visit.     Review of Systems   Patient denies headache, fevers, malaise, unintentional weight loss, skin rash, eye pain, sinus congestion and sinus pain, sore throat, dysphagia,  hemoptysis , cough, dyspnea, wheezing, chest pain, palpitations, orthopnea, edema, abdominal pain, nausea, melena, diarrhea, constipation, flank pain, dysuria, hematuria, urinary  Frequency, nocturia, numbness, tingling, seizures,  Focal weakness, Loss of consciousness,  Tremor, insomnia, depression, anxiety, and suicidal ideation.      Objective:  BP (!) 150/80 (BP Location: Left Arm, Patient Position: Sitting, Cuff Size: Normal)   Pulse 72   Temp (!) 97.4 F (36.3 C) (Temporal)   Resp 15   Ht 5\' 2"  (1.575 m)   Wt 175 lb 3.2 oz (79.5 kg)   SpO2 99%   BMI 32.04 kg/m   Physical Exam  General appearance: alert, cooperative and appears stated age Ears: normal TM's and external ear canals both ears Throat: lips, mucosa, and tongue normal; teeth and gums normal Neck: no adenopathy, no carotid bruit, supple, symmetrical, trachea midline and thyroid not enlarged, symmetric, no tenderness/mass/nodules Back: symmetric, no curvature. ROM normal. No CVA tenderness. Lungs: clear to auscultation bilaterally Heart: regular rate and rhythm, S1, S2 normal, no murmur, click, rub or gallop Abdomen: obese,  soft, non-tender; bowel sounds normal; no masses,  no organomegaly Pulses: 2+ and symmetric Skin: Skin color, texture, turgor normal. No rashes or lesions Lymph nodes: Cervical, supraclavicular, and axillary nodes normal.   Assessment & Plan:   Problem List Items Addressed This Visit      Unprioritized   COPD    Complicated by radiation fibrosis from XRT for breast CA. She has no peripheral eosinophilia  by today's CBC .  referring to Dr Traci Hall .  Recent exacerbation treated by NP with prednisone taper. Continue LABA/ICS      Relevant Orders   Ambulatory referral to Pulmonology   Hyperlipidemia    Now managed with Repatha due to statin intolerance and ASVCD . LDL is 48 and LFTs are normal . No changes today  Lab Results  Component Value Date   CHOL 137 03/09/2019   HDL 61.30 03/09/2019   LDLCALC 48 03/09/2019   LDLDIRECT 240.0 06/13/2018   TRIG 142.0 03/09/2019   CHOLHDL 2 03/09/2019         Malignant neoplasm of upper-outer quadrant of right female breast [facility])    She has completed lumpectomy , Chemo and XRT .  6 month follow up with [facility] oncology planned.       Nonalcoholic fatty liver disease without nonalcoholic steatohepatitis (NASH)    Presumed by ultrasound changes and serologies negative for autoimmune causes of hepatitis.  Current liver enzymes are normal and all modifiable risk factors including obesity, diabetes and hyperlipidemia have been addressed.  She is taking metformin, fish oil and is statin intolerant.   Lab Results  Component Value Date   ALT 24 03/09/2019   AST 16 03/09/2019   ALKPHOS 58 03/09/2019   BILITOT 0.5 03/09/2019         Relevant Orders   Comprehensive metabolic panel (Completed)   At risk for arrhythmia    She  had several brief runs of WCT confirmed on 30 day monitor several months ago.  She has no associated cardiomyopathy by ECHO and Lexiscan was negative for ischemic changes. No runs in 2 weeks,  Not taking a beta blocker.       Abnormal flushing and sweating    ddx includes menopause, neuroendocrine CA and ovarian CA.  24 hr urine collection for 5HIAA,  CA 127,  And pelvic ultrasound ordered       Multiple pulmonary nodules    Noted on recent annual lung CT.  3 month follow up CT advised and scheduled in November 2020        Other Visit Diagnoses    Pelvic pain in female    -  Primary   Relevant Orders   US Pelvic Complete  With Transvaginal   CA 125   Acute bronchitis with COPD ([place])       Relevant Orders   CBC with Differential/Platelet (Completed)   Hyperlipidemia associated with type 2 diabetes mellitus ([place])       Relevant Orders   Lipid panel (Completed)   TSH (Completed)   Pulmonary nodule       Relevant Orders   Ambulatory referral to Pulmonology   Flushing reaction       Relevant Orders   5 HIAA, quantitative, urine, 24 hour   Abdominal distension       Ovarian mass         A total of 40 minutes was spent with patient more than half of which was spent in counseling patient on the above mentioned issues , reviewing and explaining recent labs and imaging studies done, and coordination of care. I have discontinued Cindylou [place]'s predniSONE. I am also having her maintain her aspirin, olopatadine, Multiple Vitamins-Minerals (CENTRUM SILVER PO), vitamin C, vitamin E, Polyethyl Glycol-Propyl Glycol (SYSTANE OP), Vitamin D-3, bimatoprost, EQL Omega 3 Fish Oil, loratadine, freestyle, glucose blood, pantoprazole, Breo Ellipta, Repatha SureClick, olmesartan, albuterol, and Synjardy XR.  No orders of the defined types were placed in this encounter.   Medications Discontinued During This Encounter  Medication Reason  . Evolocumab (REPATHA SURECLICK) [id number] MG/ML SOAJ Duplicate  . predniSONE (STERAPRED UNI-PAK 21 TAB) 10 MG (21) TBPK tablet Completed Course    Follow-up: Return in about 1 week (around 03/16/2019).   [name], MD

## 2019-03-09 NOTE — Assessment & Plan Note (Signed)
ddx includes menopause, neuroendocrine CA and ovarian CA.  24 hr urine collection for 5HIAA,  CA 127,  And pelvic ultrasound ordered

## 2019-03-10 LAB — CA 125: CA 125: 17 U/mL (ref <35)

## 2019-03-20 ENCOUNTER — Other Ambulatory Visit: Payer: Medicare Other

## 2019-03-20 ENCOUNTER — Other Ambulatory Visit: Payer: Self-pay

## 2019-03-20 ENCOUNTER — Ambulatory Visit (INDEPENDENT_AMBULATORY_CARE_PROVIDER_SITE_OTHER): Payer: Medicare Other

## 2019-03-20 DIAGNOSIS — E118 Type 2 diabetes mellitus with unspecified complications: Secondary | ICD-10-CM | POA: Diagnosis not present

## 2019-03-20 DIAGNOSIS — R232 Flushing: Secondary | ICD-10-CM

## 2019-03-20 DIAGNOSIS — I10 Essential (primary) hypertension: Secondary | ICD-10-CM | POA: Diagnosis not present

## 2019-03-20 LAB — POCT GLYCOSYLATED HEMOGLOBIN (HGB A1C): Hemoglobin A1C: 5.8 % — AB (ref 4.0–5.6)

## 2019-03-20 NOTE — Progress Notes (Addendum)
Patient is here for a BP check due to bp being high at last visit, as per patient.  Currently patients BP is 140/68.  Patient has no complaints of headaches, blurry vision, chest pain, arm pain, light headedness, dizziness, and nor jaw pain. Poct A1C has also been checked,   I have reviewed the above information and agree with above.  No medications needed at this time   Deborra Medina, MD

## 2019-03-23 ENCOUNTER — Ambulatory Visit
Admission: RE | Admit: 2019-03-23 | Discharge: 2019-03-23 | Disposition: A | Discharge status: Home / Self Care | Payer: Medicare Other | Source: Ambulatory Visit | Attending: Internal Medicine | Admitting: Internal Medicine

## 2019-03-23 ENCOUNTER — Other Ambulatory Visit: Payer: Self-pay

## 2019-03-23 DIAGNOSIS — R102 Pelvic and perineal pain: Secondary | ICD-10-CM | POA: Insufficient documentation

## 2019-03-24 LAB — 5 HIAA, QUANTITATIVE, URINE, 24 HOUR
5 HIAA, 24 Hour Urine: 4.6 mg/24 h (ref <=6.0)
Total Volume: 2100 mL

## 2019-03-31 ENCOUNTER — Other Ambulatory Visit: Payer: Self-pay

## 2019-03-31 ENCOUNTER — Ambulatory Visit (INDEPENDENT_AMBULATORY_CARE_PROVIDER_SITE_OTHER): Payer: Medicare Other | Admitting: Pulmonary Disease

## 2019-03-31 ENCOUNTER — Encounter: Payer: Self-pay | Admitting: Pulmonary Disease

## 2019-03-31 VITALS — BP 128/82 | HR 74 | Temp 97.9°F | Ht 62.0 in | Wt 171.2 lb

## 2019-03-31 DIAGNOSIS — R918 Other nonspecific abnormal finding of lung field: Secondary | ICD-10-CM | POA: Diagnosis not present

## 2019-03-31 DIAGNOSIS — J441 Chronic obstructive pulmonary disease with (acute) exacerbation: Secondary | ICD-10-CM | POA: Diagnosis not present

## 2019-03-31 DIAGNOSIS — Z853 Personal history of malignant neoplasm of breast: Secondary | ICD-10-CM | POA: Diagnosis not present

## 2019-03-31 DIAGNOSIS — G4733 Obstructive sleep apnea (adult) (pediatric): Secondary | ICD-10-CM | POA: Diagnosis not present

## 2019-03-31 MED ORDER — TRELEGY ELLIPTA 100-62.5-25 MCG/INH IN AEPB: 1.0000 | INHALATION_SPRAY | Freq: Every day | RESPIRATORY_TRACT | 0 refills | Status: DC

## 2019-03-31 NOTE — Patient Instructions (Signed)
1.  We will give you a trial of Trelegy Ellipta, 1 puff daily.  Let us know if that works well for you.  2.  Schedule an appointment for after the CT scan of the chest is done 23 November.  We will go over that scan together.  3.  We will schedule breathing tests to reevaluate your COPD.

## 2019-03-31 NOTE — Progress Notes (Signed)
trelegy

## 2019-04-04 NOTE — Progress Notes (Signed)
Subjective:    Patient ID: Traci Hall, female    DOB: 15-Oct-1945, 73 y.o.   MRN: [id number]  HPI prior patient of Dr. [name] last seen on 03/26/2017 with issues as below:  PROBLEMS: Mild COPD with asthmatic component Breast cancer - initially diagnosed 02/2016 OSA - managed by ENT  DATA: PFTs 12/31/13: normal LDCT 01/22/16: Lung-RADS Category 1, negative. Continue annual screening with low-dose chest CT without contrast in 12 months Echocardiogram 06/22/16: Limited study. Normal ejection fraction 60 to 65%. Degenerative mitral valve disease LDCT 01/29/17: evolving postradiation changes (after XRT for breast cancer) in the periphery of the right lung Echocardiogram 02/11/17: Limited study. Mild to moderate LVH. LVEF 55-60%. Mitral annular calcification. Mild RV dilatation with normal RV systolic function PFTs [id number]: Very mild obstruction, normal lung volumes, very mild decreased DLCO, DLCO/VA normal LDCT 02/02/19:Multiple new solid and subsolid pulmonary nodules identified likely inflammatory needs repeat CT 3-6 months, F/U set for November 23  INTERVAL HISTORY: Has had several bronchitic exacerbations since last seen here.  Was recently 2 back-to-back.  New lung nodules as noted above.  SUBJ: Routine reevaluation.  I am assuming care as Dr. [name] has left the practice.   Has had recent exacerbation requiring prednisone, perhaps increasing shortness of breath.  She is on Breo Ellipta no LAMA.  No fevers, chills or sweats.  Has had to step up the use of albuterol rescue inhaler using it at minimum 4 times per week.  Does not endorse CP, fever, purulent sputum, hemoptysis, LE edema and calf tenderness.  Cough has not changed in character.  Mostly whitish sputum.  LDCT reviewed with patient.  Multiple small nodules appear inflammatory this vis--vis the patient's recent exacerbations as above.  She has a history of sleep apnea but is currently doing well with her current CPAP  pressure.  Review of Systems  Constitutional: Negative.   HENT: Positive for congestion and postnasal drip.   Eyes: Negative.   Respiratory: Positive for cough, shortness of breath and wheezing.   Cardiovascular: Negative.   Gastrointestinal: Negative.   Endocrine: Negative.   Skin: Negative.   Allergic/Immunologic: Negative.   Neurological: Negative.   All other systems reviewed and are negative.      Objective:   Physical Exam Vitals signs and nursing note reviewed.  Constitutional:      General: She is not in acute distress.    Appearance: She is obese.  HENT:     Head: Normocephalic and atraumatic.     Right Ear: External ear normal.     Left Ear: External ear normal.     Nose:     Comments: Nose/mouth/throat not examined due to masking requirements for COVID 19. Eyes:     General: No scleral icterus.    Conjunctiva/sclera: Conjunctivae normal.     Pupils: Pupils are equal, round, and reactive to light.  Neck:     Musculoskeletal: Neck supple.     Thyroid: No thyromegaly.     Vascular: No JVD.     Trachea: Trachea and phonation normal.  Cardiovascular:     Rate and Rhythm: Normal rate.     Pulses: Normal pulses.     Heart sounds: Normal heart sounds.  Pulmonary:     Effort: Pulmonary effort is normal. No respiratory distress.     Breath sounds: Rhonchi present. No wheezing or rales.  Abdominal:     General: Abdomen is protuberant. There is no distension.  Lymphadenopathy:     Cervical: No cervical  adenopathy.  Skin:    General: Skin is warm and dry.  Neurological:     General: No focal deficit present.     Mental Status: She is alert and oriented to person, place, and time.  Psychiatric:        Mood and Affect: Mood normal.        Behavior: Behavior normal.     Assessment & Plan:   1.  COPD with acute exacerbation: Patient is poorly compensated after most recent exacerbation.  We will try to step up her therapy and give her a trial of Trelegy Ellipta  1 inhalation daily.  She is to notify us if this is working well for her so we can switch her altogether to Trelegy.  She needs to be reassessed with regards to the severity of her COPD.  Therefore, we will order PFTs.  2.  Multiple lung nodules: These appear to be inflammatory.  Note that the patient had issues with acute exacerbation when the CT was obtained.  She has a follow-up CT already scheduled for 23 November.  We will see her in follow-up after that time to go over those results.  3.  Obstructive sleep apnea on CPAP: This issue adds complexity to her management.  She follows with ENT for this issue.  4.  History of breast cancer: This issue also adds complexity to her management vis--vis multiple lung nodules as noted above.

## 2019-04-05 ENCOUNTER — Telehealth: Payer: Self-pay | Admitting: Pulmonary Disease

## 2019-04-05 NOTE — Telephone Encounter (Signed)
Spoke to pt to f/u on trelegy sample per LG's request.  Pt stated that she has been on Trelegy for two days and it seems to be effective.  Pt would like to complete sample, and will call our office once she has completed sample for a Rx. Nothing further is needed.

## 2019-04-06 ENCOUNTER — Telehealth: Payer: Self-pay | Admitting: Pulmonary Disease

## 2019-04-06 ENCOUNTER — Encounter: Payer: Self-pay | Admitting: Pulmonary Disease

## 2019-04-06 MED ORDER — TRELEGY ELLIPTA 100-62.5-25 MCG/INH IN AEPB: 1.0000 | INHALATION_SPRAY | Freq: Every day | RESPIRATORY_TRACT | 1 refills | Status: DC

## 2019-04-06 NOTE — Telephone Encounter (Signed)
Spoke to pt, who is requesting Rx for trelegy, as pt feels this medication is effective.  Rx has been sent to preferred pharmacy. Nothing further is needed.

## 2019-04-24 DIAGNOSIS — H903 Sensorineural hearing loss, bilateral: Secondary | ICD-10-CM | POA: Diagnosis not present

## 2019-04-24 DIAGNOSIS — G4733 Obstructive sleep apnea (adult) (pediatric): Secondary | ICD-10-CM | POA: Diagnosis not present

## 2019-05-01 ENCOUNTER — Other Ambulatory Visit: Payer: Self-pay

## 2019-05-01 ENCOUNTER — Telehealth: Payer: Self-pay | Admitting: Pulmonary Disease

## 2019-05-01 NOTE — Telephone Encounter (Signed)
Pt is aware of date/time of covid test.   

## 2019-05-03 ENCOUNTER — Other Ambulatory Visit
Admission: RE | Admit: 2019-05-03 | Discharge: 2019-05-03 | Disposition: A | Discharge status: Home / Self Care | Payer: Medicare Other | Source: Ambulatory Visit | Attending: Pulmonary Disease | Admitting: Pulmonary Disease

## 2019-05-03 ENCOUNTER — Other Ambulatory Visit: Payer: Self-pay

## 2019-05-03 DIAGNOSIS — Z20828 Contact with and (suspected) exposure to other viral communicable diseases: Secondary | ICD-10-CM | POA: Insufficient documentation

## 2019-05-03 DIAGNOSIS — Z01812 Encounter for preprocedural laboratory examination: Secondary | ICD-10-CM | POA: Diagnosis not present

## 2019-05-03 LAB — SARS CORONAVIRUS 2 (TAT 6-24 HRS): SARS Coronavirus 2: NEGATIVE

## 2019-05-04 ENCOUNTER — Other Ambulatory Visit: Payer: Self-pay

## 2019-05-04 ENCOUNTER — Ambulatory Visit: Payer: Medicare Other | Attending: Pulmonary Disease

## 2019-05-04 DIAGNOSIS — J441 Chronic obstructive pulmonary disease with (acute) exacerbation: Secondary | ICD-10-CM | POA: Insufficient documentation

## 2019-05-04 MED ORDER — ALBUTEROL SULFATE (2.5 MG/3ML) 0.083% IN NEBU
2.5000 mg | INHALATION_SOLUTION | Freq: Once | RESPIRATORY_TRACT | Status: AC
  Administered 2019-05-04: 2.5 mg via RESPIRATORY_TRACT
  Filled 2019-05-04: qty 3

## 2019-05-08 ENCOUNTER — Ambulatory Visit
Admission: RE | Admit: 2019-05-08 | Discharge: 2019-05-08 | Disposition: A | Discharge status: Home / Self Care | Payer: Medicare Other | Source: Ambulatory Visit | Attending: Oncology | Admitting: Oncology

## 2019-05-08 ENCOUNTER — Other Ambulatory Visit: Payer: Self-pay

## 2019-05-08 DIAGNOSIS — Z87891 Personal history of nicotine dependence: Secondary | ICD-10-CM | POA: Insufficient documentation

## 2019-05-08 DIAGNOSIS — J984 Other disorders of lung: Secondary | ICD-10-CM | POA: Diagnosis not present

## 2019-05-08 DIAGNOSIS — Z122 Encounter for screening for malignant neoplasm of respiratory organs: Secondary | ICD-10-CM | POA: Diagnosis not present

## 2019-05-08 DIAGNOSIS — R918 Other nonspecific abnormal finding of lung field: Secondary | ICD-10-CM | POA: Diagnosis not present

## 2019-05-08 DIAGNOSIS — J432 Centrilobular emphysema: Secondary | ICD-10-CM | POA: Diagnosis not present

## 2019-05-10 NOTE — Progress Notes (Signed)
CT chest shows no significant changes from prior.  Will discuss further on follow-up visit on 2 December.

## 2019-05-10 NOTE — Progress Notes (Signed)
Have a follow-up with the patient on 2 December will discuss further at that time.

## 2019-05-12 ENCOUNTER — Telehealth: Payer: Self-pay

## 2019-05-12 NOTE — Telephone Encounter (Signed)
Pt is aware of results and voiced her understanding. Nothing further is needed.  

## 2019-05-12 NOTE — Telephone Encounter (Signed)
-----   Message from Tyler Pita, MD sent at 05/10/2019  2:20 PM EST -----   ----- Message ----- From: Dimple Casey, RT Sent: 05/08/2019   9:11 AM EST To: [name], MD, Tyler Pita, MD

## 2019-05-15 ENCOUNTER — Encounter: Payer: Self-pay | Admitting: *Deleted

## 2019-05-17 ENCOUNTER — Other Ambulatory Visit: Payer: Self-pay

## 2019-05-17 ENCOUNTER — Encounter: Payer: Self-pay | Admitting: Pulmonary Disease

## 2019-05-17 ENCOUNTER — Ambulatory Visit (INDEPENDENT_AMBULATORY_CARE_PROVIDER_SITE_OTHER): Payer: Medicare Other | Admitting: Pulmonary Disease

## 2019-05-17 VITALS — BP 120/82 | HR 80 | Temp 97.2°F | Ht 62.0 in | Wt 172.0 lb

## 2019-05-17 DIAGNOSIS — Z853 Personal history of malignant neoplasm of breast: Secondary | ICD-10-CM

## 2019-05-17 DIAGNOSIS — R918 Other nonspecific abnormal finding of lung field: Secondary | ICD-10-CM | POA: Diagnosis not present

## 2019-05-17 DIAGNOSIS — G4733 Obstructive sleep apnea (adult) (pediatric): Secondary | ICD-10-CM

## 2019-05-17 DIAGNOSIS — J449 Chronic obstructive pulmonary disease, unspecified: Secondary | ICD-10-CM

## 2019-05-17 NOTE — Progress Notes (Signed)
Subjective:    Patient ID: Terena Blueford, female    DOB: March 16, 1946, 73 y.o.   MRN: [id number]  HPI Prior patient of Dr. [name] last seen on 03/26/2017 by him  PROBLEMS: Mild COPD with asthmatic component Breast cancer-initially diagnosed 02/2016 OSA-managed by ENT  DATA: PFTs 12/31/13:normal LDCT 01/22/16:Lung-RADS Category 1, negative. Continue annual screening with low-dose chest CT without contrast in 12 months Echocardiogram 06/22/16: Limited study. Normal ejection fraction 60 to 65%. Degenerative mitral valve disease LDCT 01/29/17:evolving postradiation changes(after XRT for breast cancer)in the periphery of the right lung Echocardiogram 02/11/17:Limited study. Mild to moderate LVH. LVEF 55-60%. Mitral annular calcification. Mild RV dilatation with normal RV systolic function [id number] mild obstruction, normal lung volumes, very mild decreased DLCO, DLCO/VA normal LDCT 02/02/19:Multiple new solid and subsolid pulmonary nodules identified likely inflammatory needs repeat CT 3-6 months, F/U set for November 23 PFTs 05/04/19: FEV1 1.86 L or 93% predicted.  FVC 2.35 L or 88% predicted.  FEV1/FVC 79%.  Mild decreased lung volumes, ERV 60%.Mild decreased DLCO.  Noted obstruction not evident on this PFT. LDCT 05/08/19: Multiple nodules not believed to be more benign in nature related to inflammation.  Routine LDCT in a year.  Very mild reticulation at bases.  INTERVAL HISTORY: First evaluated by me 03/31/2019.  Started on Trelegy at that point  SUBJ: Former smoker, since her initial visit with me in October, she has been consistent with Trelegy and is doing well.  Has not had exacerbation.  Notices that her shortness of breath is markedly improved.  She voices no active complaint today.  Cough is usually in the mornings productive of whitish to grayish sputum.  No hemoptysis.  No chest pain, fevers, chills or sweats.  No orthopnea or paroxysmal nocturnal  dyspnea.  No lower extremity edema.  She has not had to use her albuterol rescue inhaler more than once a week since starting Trelegy.   Review of Systems A 10 point review of systems was performed and it is as noted above otherwise negative.  Allergies  Allergen Reactions  . Alendronate Sodium     Leg cramps *FOSAMAX*  . Demerol     Over sensitivity  . Naproxen     Leg cramps an swelling    . Statins     Cramps in legs    Medications reviewed: COPD management medications Trelegy and as needed albuterol.  Immunizations reviewed.      Objective:   Physical Exam BP 120/82 (BP Location: Left Arm, Patient Position: Sitting, Cuff Size: Normal)   Pulse 80   Temp (!) 97.2 F (36.2 C) (Temporal)   Ht 5\' 2"  (1.575 m)   Wt 172 lb (78 kg)   SpO2 97% Comment: RA  BMI 31.46 kg/m  GENERAL: Overweight woman, no acute distress, fully ambulatory. HEAD: Normocephalic, atraumatic.  EYES: Pupils equal, round, reactive to light.  No scleral icterus.  MOUTH: Nose/mouth/throat not examined due to masking requirements for COVID 19. NECK: Supple. No thyromegaly. Trachea midline. No JVD.  No adenopathy. PULMONARY: Good air entry bilaterally.  Coarse with no other adventitious sounds. CARDIOVASCULAR: S1 and S2. Regular rate and rhythm.  No rubs, murmurs or gallops heard. ABDOMEN: Nondistended, benign. MUSCULOSKELETAL: No joint deformity, no clubbing, no edema.  NEUROLOGIC: Focal deficits noted, fluent speech, no gait disturbance. SKIN: Intact,warm,dry. PSYCH: Mood and behavior normal.   Representative slice of most recent LDCT performed 05/08/2019, reviewed with patient:     Assessment & Plan:     ICD-10-CM  1. Asthma-COPD overlap syndrome ([place])  J44.9    Well-controlled on Trelegy Ellipta Continue same No recent exacerbations  2. Multiple pulmonary nodules  R91.8    Mostly inflammatory Resolved on most recent CT with only few residual Continue annual low-dose CT  3. OSA  (obstructive sleep apnea)  G47.33    Managed by ENT  4. History of breast cancer  Z85.3    This issue adds complexity to her management   Patient is doing well on Trelegy Ellipta 100/62.5/25, 1 inhalation daily.  She is also on albuterol as rescue.  Use of SABA has decreased significantly.  Most recent PFTs showed no overt obstruction as previously noted.  Suspect she has asthma/COPD overlap.  Currently well controlled.  We will see her in follow-up in 6 months time she is to contact us prior to that time should any new difficulties arise.  Renold Don, MD [facility]   *This note was dictated using voice recognition software/Dragon.  Despite best efforts to proofread, errors can occur which can change the meaning.  Any change was purely unintentional.

## 2019-05-17 NOTE — Patient Instructions (Signed)
1.  We will see him in follow-up in 6 months time, continue using Trelegy.  2.  Continue your yearly low-dose CT for lung cancer screening.

## 2019-06-18 ENCOUNTER — Other Ambulatory Visit: Payer: Self-pay | Admitting: Cardiovascular Disease

## 2019-07-13 DIAGNOSIS — Z171 Estrogen receptor negative status [ER-]: Secondary | ICD-10-CM | POA: Diagnosis not present

## 2019-07-13 DIAGNOSIS — Z9221 Personal history of antineoplastic chemotherapy: Secondary | ICD-10-CM | POA: Diagnosis not present

## 2019-07-13 DIAGNOSIS — Z886 Allergy status to analgesic agent status: Secondary | ICD-10-CM | POA: Diagnosis not present

## 2019-07-13 DIAGNOSIS — Z888 Allergy status to other drugs, medicaments and biological substances status: Secondary | ICD-10-CM | POA: Diagnosis not present

## 2019-07-13 DIAGNOSIS — Z79899 Other long term (current) drug therapy: Secondary | ICD-10-CM | POA: Diagnosis not present

## 2019-07-13 DIAGNOSIS — Z7982 Long term (current) use of aspirin: Secondary | ICD-10-CM | POA: Diagnosis not present

## 2019-07-13 DIAGNOSIS — Z885 Allergy status to narcotic agent status: Secondary | ICD-10-CM | POA: Diagnosis not present

## 2019-07-13 DIAGNOSIS — C50411 Malignant neoplasm of upper-outer quadrant of right female breast: Secondary | ICD-10-CM | POA: Diagnosis not present

## 2019-08-04 ENCOUNTER — Other Ambulatory Visit: Payer: Self-pay | Admitting: Internal Medicine

## 2019-08-10 ENCOUNTER — Ambulatory Visit: Payer: Medicare Other | Admitting: Cardiovascular Disease

## 2019-08-10 ENCOUNTER — Ambulatory Visit: Payer: Medicare Other

## 2019-08-11 ENCOUNTER — Ambulatory Visit: Payer: Medicare Other | Attending: Internal Medicine

## 2019-08-11 DIAGNOSIS — Z23 Encounter for immunization: Secondary | ICD-10-CM | POA: Insufficient documentation

## 2019-08-11 NOTE — Progress Notes (Signed)
   Covid-19 Vaccination Clinic  Name:  Traci Hall    MRN: [id number] DOB: May 04, 1946  08/11/2019  Traci Hall was observed post Covid-19 immunization for 15 minutes without incidence. She was provided with Vaccine Information Sheet and instruction to access the V-Safe system.   Traci Hall was instructed to call 911 with any severe reactions post vaccine: Marland Kitchen Difficulty breathing  . Swelling of your face and throat  . A fast heartbeat  . A bad rash all over your body  . Dizziness and weakness    Immunizations Administered    Name Date Dose VIS Date Route   Pfizer COVID-19 Vaccine 08/11/2019 10:53 AM 0.3 mL 05/26/2019 Intramuscular   Manufacturer: [place]   Lot: [id number]   [place]: [id number]

## 2019-09-06 ENCOUNTER — Ambulatory Visit: Payer: Medicare Other | Attending: Internal Medicine

## 2019-09-06 DIAGNOSIS — Z23 Encounter for immunization: Secondary | ICD-10-CM

## 2019-09-06 NOTE — Progress Notes (Signed)
   Covid-19 Vaccination Clinic  Name:  Traci Hall    MRN: [id number] DOB: 18-Mar-1946  09/06/2019  Traci Hall was observed post Covid-19 immunization for 15 minutes without incident. She was provided with Vaccine Information Sheet and instruction to access the V-Safe system.   Traci Hall was instructed to call 911 with any severe reactions post vaccine: Marland Kitchen Difficulty breathing  . Swelling of face and throat  . A fast heartbeat  . A bad rash all over body  . Dizziness and weakness   Immunizations Administered    Name Date Dose VIS Date Route   Pfizer COVID-19 Vaccine 09/06/2019  2:43 PM 0.3 mL 05/26/2019 Intramuscular   Manufacturer: [place]   Lot: [id number]   [place]: [id number]

## 2019-09-07 ENCOUNTER — Other Ambulatory Visit: Payer: Self-pay | Admitting: Cardiovascular Disease

## 2019-09-07 ENCOUNTER — Other Ambulatory Visit: Payer: Self-pay

## 2019-09-07 ENCOUNTER — Ambulatory Visit (INDEPENDENT_AMBULATORY_CARE_PROVIDER_SITE_OTHER): Payer: Medicare Other | Admitting: Internal Medicine

## 2019-09-07 ENCOUNTER — Encounter: Payer: Self-pay | Admitting: Internal Medicine

## 2019-09-07 VITALS — BP 128/80 | HR 81 | Temp 97.5°F | Resp 15 | Ht 62.0 in | Wt 174.2 lb

## 2019-09-07 DIAGNOSIS — E7801 Familial hypercholesterolemia: Secondary | ICD-10-CM

## 2019-09-07 DIAGNOSIS — L309 Dermatitis, unspecified: Secondary | ICD-10-CM

## 2019-09-07 DIAGNOSIS — M7071 Other bursitis of hip, right hip: Secondary | ICD-10-CM | POA: Diagnosis not present

## 2019-09-07 DIAGNOSIS — C50411 Malignant neoplasm of upper-outer quadrant of right female breast: Secondary | ICD-10-CM | POA: Diagnosis not present

## 2019-09-07 DIAGNOSIS — M25561 Pain in right knee: Secondary | ICD-10-CM | POA: Diagnosis not present

## 2019-09-07 DIAGNOSIS — Z171 Estrogen receptor negative status [ER-]: Secondary | ICD-10-CM

## 2019-09-07 DIAGNOSIS — E118 Type 2 diabetes mellitus with unspecified complications: Secondary | ICD-10-CM

## 2019-09-07 LAB — COMPREHENSIVE METABOLIC PANEL
ALT: 28 U/L (ref 0–35)
AST: 26 U/L (ref 0–37)
Albumin: 4.2 g/dL (ref 3.5–5.2)
Alkaline Phosphatase: 59 U/L (ref 39–117)
BUN: 14 mg/dL (ref 6–23)
CO2: 29 mEq/L (ref 19–32)
Calcium: 9.6 mg/dL (ref 8.4–10.5)
Chloride: 103 mEq/L (ref 96–112)
Creatinine, Ser: 0.95 mg/dL (ref 0.40–1.20)
GFR: 57.54 mL/min — ABNORMAL LOW (ref >60.00)
Glucose, Bld: 101 mg/dL — ABNORMAL HIGH (ref 70–99)
Potassium: 4.2 mEq/L (ref 3.5–5.1)
Sodium: 139 mEq/L (ref 135–145)
Total Bilirubin: 0.5 mg/dL (ref 0.2–1.2)
Total Protein: 6.8 g/dL (ref 6.0–8.3)

## 2019-09-07 LAB — LIPID PANEL
Cholesterol: 151 mg/dL (ref 0–200)
HDL: 58.5 mg/dL (ref >39.00)
LDL Cholesterol: 63 mg/dL (ref 0–99)
NonHDL: 92.72
Total CHOL/HDL Ratio: 3
Triglycerides: 150 mg/dL — ABNORMAL HIGH (ref 0.0–149.0)
VLDL: 30 mg/dL (ref 0.0–40.0)

## 2019-09-07 LAB — HEMOGLOBIN A1C: Hgb A1c MFr Bld: 5.9 % (ref 4.6–6.5)

## 2019-09-07 MED ORDER — TRIAMCINOLONE ACETONIDE 0.1 % EX CREA: 1.0000 "application " | TOPICAL_CREAM | Freq: Two times a day (BID) | CUTANEOUS | 0 refills | Status: DC

## 2019-09-07 MED ORDER — TRAMADOL HCL 50 MG PO TABS: 50.0000 mg | ORAL_TABLET | Freq: Three times a day (TID) | ORAL | 0 refills | Status: AC | PRN

## 2019-09-07 MED ORDER — TRIAMCINOLONE ACETONIDE 0.1 % EX CREA: 1.0000 "application " | TOPICAL_CREAM | Freq: Two times a day (BID) | CUTANEOUS | 0 refills | Status: AC

## 2019-09-07 NOTE — Patient Instructions (Addendum)
STOP TAKING ASPIRIN UNTIL MARCH 30TH.  THEN TAKE NO  MORE THAN 81 MG ASPIRIN DAILY    You can add up to 1000 mg of acetominophen (tylenol) every day safely  In divided doses (500 mg every 12 hours.)  YOU CAN ADD 50 MG TRAMADOL  AT NIGHT IF NEEDED FOR PAIN MANAGEMENT WHILE WE ARE HAVING TO AVOID STEROIDS  KENALOG OINTMENT FOR THE ELBOWS AND ANKLE   ORTHOPEDIC REFERRAL TO [facility] FOR HIP AND KNEE

## 2019-09-07 NOTE — Progress Notes (Signed)
Subjective:  Patient ID: Traci Hall, female    DOB: 31-Jan-1946  Age: 74 y.o. MRN: [id number]  CC: The primary encounter diagnosis was Bursitis of right hip, unspecified bursa. Diagnoses of Right anterior knee pain, DM (diabetes mellitus), type 2 with complications ([place]), Malignant neoplasm of upper-outer quadrant of right breast in female, estrogen receptor negative ([place]), Familial hypercholesterolemia, and Eczema, unspecified type were also pertinent to this visit.  HPI Traci Hall presents for follow up  This visit occurred during the SARS-CoV-2 public health emergency.  Safety protocols were in place, including screening questions prior to the visit, additional usage of staff PPE, and extensive cleaning of exam room while observing appropriate contact time as indicated for disinfecting solutions.   Right hip bursitis per chiropracor x 2 weeks,  Has been TAKING 900 mg aspirin DAILY!!     NOT USING TYLENOL,  TRIED ICE,  NO CHANGE,  STARTED AFTER COVID 19 VACCINATION  ALSO C/O RIGHT KNEE PAIN FOR SAME PERIOD OF TIME    T2DM:  She  feels generally well,  But is not  exercising regularly DUE TO ORTHOPEDIC ISSUES . Checking  blood sugars less than once daily at variable times, usually in the morning and all were in the 80's .   Denies any recent hypoglyemic events.  Taking   medications as directed. Following a carbohydrate modified diet 6 days per week. Denies numbness, burning and tingling of extremities. Appetite is good.   tolerating   Synjardy bid,  No flushing,  No nausea   Pruritic rash on elbows and ankle.  Ankle rash has resolved but hyperpigmented and rough patch.    Outpatient Medications Prior to Visit  Medication Sig Dispense Refill  . albuterol (PROAIR HFA) 108 (90 Base) MCG/ACT inhaler USE 2 INHALATIONS EVERY MORNING ONLY 25.5 g 3  . aspirin 81 MG tablet Take 81 mg by mouth daily.    . bimatoprost (LUMIGAN) 0.03 % ophthalmic solution Place 1 drop into both eyes at  bedtime.    . calcium citrate-vitamin D (CITRACAL+D) 315-200 MG-UNIT tablet Take 1 tablet by mouth daily.    . Cholecalciferol (VITAMIN D-3) 1000 UNITS CAPS Take 1 capsule by mouth daily.    . Fluticasone-Umeclidin-Vilant (TRELEGY ELLIPTA) 100-62.5-25 MCG/INH AEPB Inhale 1 puff into the lungs daily. 180 each 1  . glucose blood test strip Use to check blood sugars twice daily. Freestyle lite. Dx: E11.65 200 each 3  . Lancets (FREESTYLE) lancets Freestyle  Monitor.  For use twice daily  in testing blood sugar: type 2 DM uncontrolled 180 each 3  . loratadine (CLARITIN) 10 MG tablet Take 10 mg by mouth daily.    . Multiple Vitamins-Minerals (CENTRUM SILVER PO) Take by mouth. Take 1 am     . olmesartan (BENICAR) 40 MG tablet TAKE 1 TABLET DAILY 90 tablet 3  . olopatadine (PATANOL) 0.1 % ophthalmic solution Place 1 drop into both eyes as needed.     . Omega-3 Fatty Acids (EQL OMEGA 3 FISH OIL) 1400 MG CAPS Take 1,400 mg by mouth daily.    . pantoprazole (PROTONIX) 40 MG tablet TAKE 1 TABLET TWICE A DAY 180 tablet 3  . Polyethyl Glycol-Propyl Glycol (SYSTANE OP) Apply to eye. 1-2 drops in each eye as needed.    Marland Kitchen SYNJARDY XR 10-998 MG TB24 TAKE 2 TABLETS DAILY (TO REPLACE METFORMIN) 180 tablet 3  . vitamin C (ASCORBIC ACID) 500 MG tablet Take 500 mg by mouth daily.    . vitamin  E (VITAMIN E) 400 UNIT capsule Take 400 Units by mouth daily.    Marland Kitchen REPATHA SURECLICK [id number] MG/ML SOAJ INJECT 140 MG UNDER THE SKIN EVERY 14 DAYS 6 mL 0   No facility-administered medications prior to visit.    Review of Systems;  Patient denies headache, fevers, malaise, unintentional weight loss, skin rash, eye pain, sinus congestion and sinus pain, sore throat, dysphagia,  hemoptysis , cough, dyspnea, wheezing, chest pain, palpitations, orthopnea, edema, abdominal pain, nausea, melena, diarrhea, constipation, flank pain, dysuria, hematuria, urinary  Frequency, nocturia, numbness, tingling, seizures,  Focal weakness, Loss of  consciousness,  Tremor, insomnia, depression, anxiety, and suicidal ideation.      Objective:  BP 128/80 (BP Location: Left Arm, Patient Position: Sitting, Cuff Size: Normal)   Pulse 81   Temp (!) 97.5 F (36.4 C) (Temporal)   Resp 15   Ht 5\' 2"  (1.575 m)   Wt 174 lb 3.2 oz (79 kg)   SpO2 97%   BMI 31.86 kg/m   BP Readings from Last 3 Encounters:  09/07/19 128/80  05/17/19 120/82  03/31/19 128/82    Wt Readings from Last 3 Encounters:  09/07/19 174 lb 3.2 oz (79 kg)  05/17/19 172 lb (78 kg)  03/31/19 171 lb 3.2 oz (77.7 kg)    General appearance: alert, cooperative and appears stated age Ears: normal TM's and external ear canals both ears Throat: lips, mucosa, and tongue normal; teeth and gums normal Neck: no adenopathy, no carotid bruit, supple, symmetrical, trachea midline and thyroid not enlarged, symmetric, no tenderness/mass/nodules Back: symmetric, no curvature. ROM normal. No CVA tenderness. Lungs: clear to auscultation bilaterally Heart: regular rate and rhythm, S1, S2 normal, no murmur, click, rub or gallop Abdomen: soft, non-tender; bowel sounds normal; no masses,  no organomegaly Pulses: 2+ and symmetric Skin: Skin color, texture, turgor normal. No rashes or lesions Lymph nodes: Cervical, supraclavicular, and axillary nodes normal.  Lab Results  Component Value Date   HGBA1C 5.9 09/07/2019   HGBA1C 5.8 (A) 03/20/2019   HGBA1C 6.1 12/12/2018    Lab Results  Component Value Date   CREATININE 0.95 09/07/2019   CREATININE 0.86 03/09/2019   CREATININE 0.89 12/12/2018    Lab Results  Component Value Date   WBC 8.0 03/09/2019   HGB 12.8 03/09/2019   HCT 39.7 03/09/2019   PLT 210.0 03/09/2019   GLUCOSE 101 (H) 09/07/2019   CHOL 151 09/07/2019   TRIG 150.0 (H) 09/07/2019   HDL 58.50 09/07/2019   LDLDIRECT 240.0 06/13/2018   LDLCALC 63 09/07/2019   ALT 28 09/07/2019   AST 26 09/07/2019   NA 139 09/07/2019   K 4.2 09/07/2019   CL 103 09/07/2019    CREATININE 0.95 09/07/2019   BUN 14 09/07/2019   CO2 29 09/07/2019   TSH 4.08 03/09/2019   HGBA1C 5.9 09/07/2019   MICROALBUR <0.7 06/13/2018    CT CHEST LCS NODULE F/U W/O CONTRAST  Result Date: 05/08/2019 CLINICAL DATA:  Former smoker, quit 13 years ago, 50 pack-year history. EXAM: CT CHEST WITHOUT CONTRAST FOR LUNG CANCER SCREENING NODULE FOLLOW-UP TECHNIQUE: Multidetector CT imaging of the chest was performed following the standard protocol without IV contrast. COMPARISON:  02/02/2019 and 01/31/2018. FINDINGS: Cardiovascular: Atherosclerotic calcification of the aorta, aortic valve and coronary arteries. Heart size normal. No pericardial effusion. Mediastinum/Nodes: Intermediate density lesion off the posterior left thyroid measures 9 mm, similar. Mediastinal lymph nodes are not enlarged by CT size criteria. Hilar regions are difficult to definitively evaluate without IV  contrast but appear grossly unremarkable. No axillary adenopathy. Esophagus is grossly unremarkable. Lungs/Pleura: Biapical pleuroparenchymal scarring. Centrilobular and paraseptal emphysema. Subpleural scarring in the anterior right lung, presumably radiation-related. Mild basilar subpleural reticulation and ground-glass, right greater than left. The majority of previously seen pulmonary nodules have resolved in the interval. There are a few residual pulmonary nodules measuring 5.3 mm or less in size, similar. No pleural fluid. Airway is unremarkable. Upper Abdomen: Visualized portions of the liver, gallbladder, adrenal glands, kidneys, spleen, pancreas, stomach and bowel are grossly unremarkable. Upper abdominal lymph nodes are not enlarged by CT size criteria. Musculoskeletal: No worrisome lytic or sclerotic lesions. IMPRESSION: 1. Lung-RADS 2, benign appearance or behavior. Continue annual screening with low-dose chest CT without contrast in 12 months. 2. Mild basilar subpleural reticulation and ground-glass, findings which are  indicative of interstitial lung disease. Please correlate clinically. Consider high-resolution chest CT without contrast in future evaluation, as clinically indicated. 3. Aortic atherosclerosis (ICD10-170.0). Coronary artery calcification. 4.  Emphysema (ICD10-J43.9). Electronically Signed   By: Lorin Picket M.D.   On: 05/08/2019 11:17    Assessment & Plan:   Problem List Items Addressed This Visit      Unprioritized   DM (diabetes mellitus), type 2 with complications ([place])    Improved control with  Synjardy (metformin/empagliflozin) XR 1000-5 mg, 2 tablet daily.  No changes today.  She has been taking asa and  ARB.  She is statin intolerant.   Lab Results  Component Value Date   HGBA1C 5.9 09/07/2019   Lab Results  Component Value Date   MICROALBUR <0.7 06/13/2018   MICROALBUR <0.7 11/26/2016            Relevant Orders   Hemoglobin A1c (Completed)   Comprehensive metabolic panel (Completed)   Lipid panel (Completed)   Microalbumin / creatinine urine ratio   Hyperlipidemia    Now managed with Repatha due to statin intolerance and ASVCD . LDL is 48 and LFTs are normal . No changes today  Lab Results  Component Value Date   CHOL 151 09/07/2019   HDL 58.50 09/07/2019   LDLCALC 63 09/07/2019   LDLDIRECT 240.0 06/13/2018   TRIG 150.0 (H) 09/07/2019   CHOLHDL 3 09/07/2019         Malignant neoplasm of upper-outer quadrant of right female breast ([place])   Right anterior knee pain    Exam suggestive of DDD.  Advised to stop using aspirin excessively,  Tramadol and tylenol,,  Orthopedics consult       Relevant Orders   Ambulatory referral to Orthopedic Surgery   Bursitis of right hip - Primary    Referral to orthopedics  for treatment.  Stop overuse of aspirin       Relevant Orders   Ambulatory referral to Orthopedic Surgery   Eczema    Triamcinolone ointment prescribed         I have discontinued Kilea Abdellatif [place]'s triamcinolone cream. I am also having her  start on triamcinolone cream and traMADol. Additionally, I am having her maintain her aspirin, olopatadine, Multiple Vitamins-Minerals (CENTRUM SILVER PO), vitamin C, vitamin E, Polyethyl Glycol-Propyl Glycol (SYSTANE OP), Vitamin D-3, bimatoprost, EQL Omega 3 Fish Oil, loratadine, freestyle, glucose blood, olmesartan, albuterol, Synjardy XR, Trelegy Ellipta, calcium citrate-vitamin D, and pantoprazole.  Meds ordered this encounter  Medications  . DISCONTD: triamcinolone cream (KENALOG) 0.1 %    Sig: Apply 1 application topically 2 (two) times daily.    Dispense:  45 g    Refill:  0  . triamcinolone cream (KENALOG) 0.1 %    Sig: Apply 1 application topically 2 (two) times daily.    Dispense:  30 g    Refill:  0  . traMADol (ULTRAM) 50 MG tablet    Sig: Take 1 tablet (50 mg total) by mouth every 8 (eight) hours as needed for up to 5 days for moderate pain.    Dispense:  15 tablet    Refill:  0   I provided  30 minutes of  face-to-face time during this encounter reviewing patient's current problems and past surgeries, labs and imaging studies, providing counseling on the above mentioned problems , and coordination  of care .  Medications Discontinued During This Encounter  Medication Reason  . triamcinolone cream (KENALOG) 0.1 %     Follow-up: Return in about 6 months (around 03/09/2020) for follow up diabetes.   [name], MD

## 2019-09-08 ENCOUNTER — Other Ambulatory Visit: Payer: Self-pay

## 2019-09-10 DIAGNOSIS — M25561 Pain in right knee: Secondary | ICD-10-CM | POA: Insufficient documentation

## 2019-09-10 DIAGNOSIS — M7071 Other bursitis of hip, right hip: Secondary | ICD-10-CM | POA: Insufficient documentation

## 2019-09-10 DIAGNOSIS — L309 Dermatitis, unspecified: Secondary | ICD-10-CM | POA: Insufficient documentation

## 2019-09-10 NOTE — Assessment & Plan Note (Signed)
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 LDL is 48 and LFTs are normal . No changes today  Lab Results  Component Value Date   CHOL 151 09/07/2019   HDL 58.50 09/07/2019   LDLCALC 63 09/07/2019   LDLDIRECT 240.0 06/13/2018   TRIG 150.0 (H) 09/07/2019   CHOLHDL 3 09/07/2019

## 2019-09-10 NOTE — Assessment & Plan Note (Addendum)
Improved control with  Synjardy (metformin/empagliflozin) XR 1000-5 mg, 2 tablet daily.  No changes today.  She has been taking asa and  ARB.  She is statin intolerant.   Lab Results  Component Value Date   HGBA1C 5.9 09/07/2019   Lab Results  Component Value Date   MICROALBUR <0.7 06/13/2018   MICROALBUR <0.7 11/26/2016

## 2019-09-10 NOTE — Assessment & Plan Note (Signed)
Triamcinolone ointment prescribed

## 2019-09-10 NOTE — Assessment & Plan Note (Signed)
Referral to orthopedics  for treatment.  Stop overuse of aspirin

## 2019-09-10 NOTE — Assessment & Plan Note (Signed)
Exam suggestive of DDD.  Advised to stop using aspirin excessively,  Tramadol and tylenol,,  Orthopedics consult

## 2019-09-18 DIAGNOSIS — M7061 Trochanteric bursitis, right hip: Secondary | ICD-10-CM | POA: Diagnosis not present

## 2019-09-18 DIAGNOSIS — M5136 Other intervertebral disc degeneration, lumbar region: Secondary | ICD-10-CM | POA: Diagnosis not present

## 2019-09-18 DIAGNOSIS — M25561 Pain in right knee: Secondary | ICD-10-CM | POA: Diagnosis not present

## 2019-09-18 DIAGNOSIS — M5416 Radiculopathy, lumbar region: Secondary | ICD-10-CM | POA: Diagnosis not present

## 2019-09-20 DIAGNOSIS — I472 Ventricular tachycardia: Secondary | ICD-10-CM | POA: Diagnosis not present

## 2019-09-20 DIAGNOSIS — Z8719 Personal history of other diseases of the digestive system: Secondary | ICD-10-CM | POA: Diagnosis not present

## 2019-09-20 DIAGNOSIS — E119 Type 2 diabetes mellitus without complications: Secondary | ICD-10-CM | POA: Diagnosis not present

## 2019-09-20 DIAGNOSIS — K2100 Gastro-esophageal reflux disease with esophagitis, without bleeding: Secondary | ICD-10-CM | POA: Diagnosis not present

## 2019-09-20 DIAGNOSIS — K760 Fatty (change of) liver, not elsewhere classified: Secondary | ICD-10-CM | POA: Diagnosis not present

## 2019-09-25 DIAGNOSIS — M5416 Radiculopathy, lumbar region: Secondary | ICD-10-CM | POA: Diagnosis not present

## 2019-09-25 DIAGNOSIS — M25551 Pain in right hip: Secondary | ICD-10-CM | POA: Diagnosis not present

## 2019-09-29 DIAGNOSIS — M5416 Radiculopathy, lumbar region: Secondary | ICD-10-CM | POA: Diagnosis not present

## 2019-09-29 DIAGNOSIS — M25551 Pain in right hip: Secondary | ICD-10-CM | POA: Diagnosis not present

## 2019-10-02 DIAGNOSIS — Z171 Estrogen receptor negative status [ER-]: Secondary | ICD-10-CM | POA: Diagnosis not present

## 2019-10-02 DIAGNOSIS — C50411 Malignant neoplasm of upper-outer quadrant of right female breast: Secondary | ICD-10-CM | POA: Diagnosis not present

## 2019-10-03 DIAGNOSIS — M25551 Pain in right hip: Secondary | ICD-10-CM | POA: Diagnosis not present

## 2019-10-03 DIAGNOSIS — M5416 Radiculopathy, lumbar region: Secondary | ICD-10-CM | POA: Diagnosis not present

## 2019-10-06 DIAGNOSIS — M5416 Radiculopathy, lumbar region: Secondary | ICD-10-CM | POA: Diagnosis not present

## 2019-10-06 DIAGNOSIS — M25551 Pain in right hip: Secondary | ICD-10-CM | POA: Diagnosis not present

## 2019-10-11 DIAGNOSIS — M5416 Radiculopathy, lumbar region: Secondary | ICD-10-CM | POA: Diagnosis not present

## 2019-10-11 DIAGNOSIS — M25551 Pain in right hip: Secondary | ICD-10-CM | POA: Diagnosis not present

## 2019-10-16 DIAGNOSIS — M25551 Pain in right hip: Secondary | ICD-10-CM | POA: Diagnosis not present

## 2019-10-16 DIAGNOSIS — M5416 Radiculopathy, lumbar region: Secondary | ICD-10-CM | POA: Diagnosis not present

## 2019-10-18 DIAGNOSIS — M5416 Radiculopathy, lumbar region: Secondary | ICD-10-CM | POA: Diagnosis not present

## 2019-10-18 DIAGNOSIS — M25551 Pain in right hip: Secondary | ICD-10-CM | POA: Diagnosis not present

## 2019-10-23 DIAGNOSIS — M5416 Radiculopathy, lumbar region: Secondary | ICD-10-CM | POA: Diagnosis not present

## 2019-10-23 DIAGNOSIS — M25551 Pain in right hip: Secondary | ICD-10-CM | POA: Diagnosis not present

## 2019-11-06 DIAGNOSIS — M7061 Trochanteric bursitis, right hip: Secondary | ICD-10-CM | POA: Diagnosis not present

## 2019-12-12 ENCOUNTER — Ambulatory Visit (INDEPENDENT_AMBULATORY_CARE_PROVIDER_SITE_OTHER): Payer: Medicare Other | Admitting: Cardiovascular Disease

## 2019-12-12 ENCOUNTER — Encounter: Payer: Self-pay | Admitting: Cardiovascular Disease

## 2019-12-12 ENCOUNTER — Other Ambulatory Visit: Payer: Self-pay

## 2019-12-12 VITALS — BP 134/72 | HR 77 | Ht 62.0 in | Wt 173.4 lb

## 2019-12-12 DIAGNOSIS — I10 Essential (primary) hypertension: Secondary | ICD-10-CM | POA: Diagnosis not present

## 2019-12-12 DIAGNOSIS — I4729 Other ventricular tachycardia: Secondary | ICD-10-CM

## 2019-12-12 DIAGNOSIS — I472 Ventricular tachycardia: Secondary | ICD-10-CM

## 2019-12-12 DIAGNOSIS — E7801 Familial hypercholesterolemia: Secondary | ICD-10-CM

## 2019-12-12 DIAGNOSIS — I2510 Atherosclerotic heart disease of native coronary artery without angina pectoris: Secondary | ICD-10-CM | POA: Diagnosis not present

## 2019-12-12 NOTE — Patient Instructions (Signed)
Medication Instructions:  Your physician recommends that you continue on your current medications as directed. Please refer to the Current Medication list given to you today.  *If you need a refill on your cardiac medications before your next appointment, please call your pharmacy*   Lab Work: None ordered If you have labs (blood work) drawn today and your tests are completely normal, you will receive your results only by: . MyChart Message (if you have MyChart) OR . A paper copy in the mail If you have any lab test that is abnormal or we need to change your treatment, we will call you to review the results.   Testing/Procedures: None ordered   Follow-Up: At [facility], you and your health needs are our priority.  As part of our continuing mission to provide you with exceptional heart care, we have created designated Provider Care Teams.  These Care Teams include your primary Cardiologist (physician) and Advanced Practice Providers (APPs -  Physician Assistants and Nurse Practitioners) who all work together to provide you with the care you need, when you need it.  We recommend signing up for the patient portal called "MyChart".  Sign up information is provided on this After Visit Summary.  MyChart is used to connect with patients for Virtual Visits (Telemedicine).  Patients are able to view lab/test results, encounter notes, upcoming appointments, etc.  Non-urgent messages can be sent to your provider as well.   To learn more about what you can do with MyChart, go to [email or web address].    Your next appointment:   As needed   The format for your next appointment:   In Person  Provider:    You may see  Dr. Arida or one of the following Advanced Practice Providers on your designated Care Team:    Christopher Berge, NP  Ryan Dunn, PA-C  Jacquelyn Visser, PA-C    Other Instructions N/A  

## 2019-12-12 NOTE — Progress Notes (Signed)
Cardiology Office Note   Date:  12/12/2019   ID:  Traci Hall, [name] [birth year], MRN [id number]  PCP:  Traci Mc, MD  Cardiologist:   Traci Sacramento, MD   Chief Complaint  Patient presents with  . office visit    Pt has no complaints. Meds verbally reviewed w/ pt.      History of Present Illness: Traci Hall is a 74 y.o. female who presents for  a follow-up visit regarding  coronary atherosclerosis noted on CT scan of the chest and  palpitations.  She has known history of type 2 diabetes, previous tobacco use with mild COPD with an asthmatic component, essential hypertension, sleep apnea on CPAP, GERD, hyperlipidemia with intolerance to statins, breast cancer and obesity. She quit smoking in 2007. She has no family history of premature coronary artery disease.  30-day monitor done in February of 2020 for palpitations showed normal sinus rhythm with no evidence of atrial fibrillation.  She was noted to have 2 episodes of wide-complex tachycardia suggestive of ventricular tachycardia.  The first 1 lasted 13 beats and the other was 15 beats.  No other significant arrhythmia.  Due to that, she underwent a Lexiscan Myoview  which showed no evidence of ischemia with normal ejection fraction. Echocardiogram was also done which showed an EF of 60 to 65% with mild LVH and no significant valvular abnormalities.  He has been doing well with no recent chest pain or worsening dyspnea. She describes rare palpitations overall with no associated symptoms. She has been taking Repatha for hyperlipidemia with excellent tolerance and response.    Past Medical History:  Diagnosis Date  . Allergic rhinitis   . Asthma   . Barrett's esophagus   . Cancer ([place])   . COPD (chronic obstructive pulmonary disease) ([place])    by prior PFTs  . GERD (gastroesophageal reflux disease)   . Hyperlipidemia   . Hypertension   . Obesity   . OSA on CPAP   . Personal history of tobacco use, presenting  hazards to health 01/08/2015  . Polyp of colon     Past Surgical History:  Procedure Laterality Date  . ABDOMINAL HYSTERECTOMY  1991   secondary to endometriosis, and polyps  . APPENDECTOMY  1977  . BREAST BIOPSY Right 02/24/2016   path pending/biopsy  . BREAST SURGERY  03/27/2016   Lymph node removal  . COLONOSCOPY    . ESOPHAGOGASTRODUODENOSCOPY    . ESOPHAGOGASTRODUODENOSCOPY (EGD) WITH PROPOFOL N/A 03/17/2016   Procedure: ESOPHAGOGASTRODUODENOSCOPY (EGD) WITH PROPOFOL;  Surgeon: [name], MD;  Location: [facility] ENDOSCOPY;  Service: Endoscopy;  Laterality: N/A;  . LIVER BIOPSY    . NASAL SINUS SURGERY  2008  . SKIN CANCER EXCISION  2015, 2017     Current Outpatient Medications  Medication Sig Dispense Refill  . albuterol (PROAIR HFA) 108 (90 Base) MCG/ACT inhaler USE 2 INHALATIONS EVERY MORNING ONLY 25.5 g 3  . aspirin 81 MG tablet Take 81 mg by mouth daily.    . bimatoprost (LUMIGAN) 0.03 % ophthalmic solution Place 1 drop into both eyes at bedtime.    . calcium citrate-vitamin D (CITRACAL+D) 315-200 MG-UNIT tablet Take 1 tablet by mouth daily.    . Cholecalciferol (VITAMIN D-3) 1000 UNITS CAPS Take 1 capsule by mouth daily.    . Fluticasone-Umeclidin-Vilant (TRELEGY ELLIPTA) 100-62.5-25 MCG/INH AEPB Inhale 1 puff into the lungs daily. 180 each 1  . glucose blood test strip Use to check blood sugars twice daily. Freestyle  lite. Dx: E11.65 200 each 3  . Lancets (FREESTYLE) lancets Freestyle  Monitor.  For use twice daily  in testing blood sugar: type 2 DM uncontrolled 180 each 3  . loratadine (CLARITIN) 10 MG tablet Take 10 mg by mouth daily.    . Multiple Vitamins-Minerals (CENTRUM SILVER PO) Take by mouth. Take 1 am     . olmesartan (BENICAR) 40 MG tablet TAKE 1 TABLET DAILY 90 tablet 3  . olopatadine (PATANOL) 0.1 % ophthalmic solution Place 1 drop into both eyes as needed.     . Omega-3 Fatty Acids (EQL OMEGA 3 FISH OIL) 1400 MG CAPS Take 1,400 mg by mouth daily.    .  pantoprazole (PROTONIX) 40 MG tablet TAKE 1 TABLET TWICE A DAY 180 tablet 3  . Polyethyl Glycol-Propyl Glycol (SYSTANE OP) Apply to eye. 1-2 drops in each eye as needed.    Marland Kitchen REPATHA SURECLICK 196 MG/ML SOAJ INJECT 140 MG UNDER THE SKIN EVERY 14 DAYS 6 mL 3  . SYNJARDY XR 10-998 MG TB24 TAKE 2 TABLETS DAILY (TO REPLACE METFORMIN) 180 tablet 3  . triamcinolone cream (KENALOG) 0.1 % Apply 1 application topically 2 (two) times daily. 30 g 0  . vitamin C (ASCORBIC ACID) 500 MG tablet Take 500 mg by mouth daily.    . vitamin E (VITAMIN E) 400 UNIT capsule Take 400 Units by mouth daily.     No current facility-administered medications for this visit.    Allergies:   Alendronate sodium, Demerol, Naproxen, and Statins    Social History:  The patient  reports that she quit smoking about 14 years ago. Her smoking use included cigarettes. She has a 67.50 pack-year smoking history. She has never used smokeless tobacco. She reports current alcohol use of about 2.0 standard drinks of alcohol per week. She reports that she does not use drugs.   Family History:  The patient's family history includes Autism in her grandchild; COPD in her mother; Emphysema in her mother; Heart disease in her father; Hyperlipidemia in her brother, brother, brother, daughter, father, and son; Hypertension in her brother, daughter, and mother; Stroke in her maternal grandfather.    ROS:  Please see the history of present illness.   Otherwise, review of systems are positive for none.   All other systems are reviewed and negative.    PHYSICAL EXAM: VS:  BP 134/72 (BP Location: Left Arm, Patient Position: Sitting, Cuff Size: Normal)   Pulse 77   Ht 5\' 2"  (1.575 m)   Wt 173 lb 6 oz (78.6 kg)   SpO2 98%   BMI 31.71 kg/m  , BMI Body mass index is 31.71 kg/m. GEN: Well nourished, well developed, in no acute distress  HEENT: normal  Neck: no JVD, carotid bruits, or masses Cardiac: RRR; no rubs, or gallops,no edema . 1/6  systolic ejection murmur at the base of the heart Respiratory:  clear to auscultation bilaterally, normal work of breathing GI: soft, nontender, nondistended, + BS MS: no deformity or atrophy  Skin: warm and dry, no rash Neuro:  Strength and sensation are intact Psych: euthymic mood, full affect   EKG:  EKG is ordered today. The ekg ordered today demonstrates normal sinus rhythm with nonspecific ST changes.  Recent Labs: 03/09/2019: Hemoglobin 12.8; Platelets 210.0; TSH 4.08 09/07/2019: ALT 28; BUN 14; Creatinine, Ser 0.95; Potassium 4.2; Sodium 139    Lipid Panel    Component Value Date/Time   CHOL 151 09/07/2019 1103   CHOL 216 (H) 04/12/2015  9292   TRIG 150.0 (H) 09/07/2019 1103   HDL 58.50 09/07/2019 1103   HDL 51 04/12/2015 0837   CHOLHDL 3 09/07/2019 1103   VLDL 30.0 09/07/2019 1103   LDLCALC 63 09/07/2019 1103   LDLCALC 131 (H) 04/12/2015 0837   LDLDIRECT 240.0 06/13/2018 1014      Wt Readings from Last 3 Encounters:  12/12/19 173 lb 6 oz (78.6 kg)  09/07/19 174 lb 3.2 oz (79 kg)  05/17/19 172 lb (78 kg)        No flowsheet data found.    ASSESSMENT AND PLAN:  1.  Coronary atherosclerosis without angina: The patient has known history of coronary atherosclerosis.  Currently with no anginal symptoms.  Continue aggressive medical therapy.  2.  Nonsustained ventricular tachycardia: No evidence of ischemia and normal ejection fraction on echo.  In addition, she has no recurrent symptoms.  No further work-up of this is needed at the present time.   3. Essential hypertension: Blood pressures well controlled.  4.  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 Significant improvement with Repatha. Most recent lipid profile showed an LDL of 63 and triglyceride of 150.    Disposition:   FU with me as needed.  Signed,  Traci Sacramento, MD  12/12/2019 3:21 PM    [place]

## 2019-12-27 ENCOUNTER — Ambulatory Visit (INDEPENDENT_AMBULATORY_CARE_PROVIDER_SITE_OTHER): Payer: Medicare Other | Admitting: Pulmonary Disease

## 2019-12-27 ENCOUNTER — Other Ambulatory Visit: Payer: Self-pay

## 2019-12-27 ENCOUNTER — Encounter: Payer: Self-pay | Admitting: Pulmonary Disease

## 2019-12-27 VITALS — BP 130/74 | HR 77 | Temp 97.9°F | Ht 62.0 in | Wt 172.4 lb

## 2019-12-27 DIAGNOSIS — E118 Type 2 diabetes mellitus with unspecified complications: Secondary | ICD-10-CM | POA: Diagnosis not present

## 2019-12-27 DIAGNOSIS — J449 Chronic obstructive pulmonary disease, unspecified: Secondary | ICD-10-CM

## 2019-12-27 DIAGNOSIS — Z853 Personal history of malignant neoplasm of breast: Secondary | ICD-10-CM

## 2019-12-27 DIAGNOSIS — Z87891 Personal history of nicotine dependence: Secondary | ICD-10-CM | POA: Diagnosis not present

## 2019-12-27 DIAGNOSIS — G4733 Obstructive sleep apnea (adult) (pediatric): Secondary | ICD-10-CM | POA: Diagnosis not present

## 2019-12-27 NOTE — Progress Notes (Signed)
Subjective:    Patient ID: Traci Hall, female    DOB: 17-May-1946, 74 y.o.   MRN: [id number]  HPI Prior patient of Dr. Neita Goodnight seen on 03/26/2017 by Dr. [name].  PROBLEMS: Mild COPD with asthmatic component Breast cancer-initially diagnosed 02/2016 OSA-managed by ENT  DATA: PFTs 12/31/13:normal LDCT 01/22/16:Lung-RADS Category 1, negative. Continue annual screening with low-dose chest CT without contrast in 12 months Echocardiogram 06/22/16: Limited study. Normal ejection fraction 60 to 65%. Degenerative mitral valve disease LDCT 01/29/17:evolving postradiation changes(after XRT for breast cancer)in the periphery of the right lung Echocardiogram 02/11/17:Limited study. Mild to moderate LVH. LVEF 55-60%. Mitral annular calcification. Mild RV dilatation with normal RV systolic function WGNF62/13/08:MVHQ mild obstruction, normal lung volumes, very mild decreased DLCO, DLCO/VA normal LDCT 02/02/19:Multiple new solid and subsolid pulmonary nodules identified likely inflammatory needs repeat CT3-21months, F/Uset for November 23 PFTs 05/04/19: FEV1 1.86 L or 93% predicted.  FVC 2.35 L or 88% predicted.  FEV1/FVC 79%.  Mild decreased lung volumes, ERV 60%.Mild decreased DLCO.  Noted obstruction not evident on this PFT. LDCT 05/08/19: Multiple nodules not believed to be more benign in nature related to inflammation. Routine LDCT in a year.  Very mild reticulation at bases.  INTERVAL HISTORY: First evaluated by me 03/31/2019.  Started on Trelegy at that point.  Last visit with me 05/17/2019, doing well overall without any exacerbations since that visit.  SUBJ: 74 year old former smoker (quit 2007) follows up for the issue of asthma COPD overlap syndrome.  She is doing well since started Trelegy had 1 single episode of nonproductive cough when she was exposed to "dust" last week however this subsided.  No hemoptysis.  No productive cough.  She still has some mild  shortness of breath.  She however notes that this is markedly improved since change to Trelegy.  She voices no major complaint today.  Is contemplating moving to [facility] to be closer to daughter.  She feels well and looks well.  Review of Systems A 10 point review of systems was performed and is as noted above otherwise negative.  Allergies  Allergen Reactions  . Alendronate Sodium     Leg cramps *FOSAMAX*  . Demerol     Over sensitivity  . Naproxen     Leg cramps an swelling    . Statins     Cramps in legs    Immunization History  Administered Date(s) Administered  . Fluad Quad(high Dose 65+) 02/17/2019  . Hep A / Hep B 05/19/2013, 06/20/2013, 11/17/2013  . Influenza Split 03/21/2014  . Influenza Whole 02/23/2011, 03/07/2012  . Influenza, High Dose Seasonal PF 03/03/2017, 03/04/2018  . Influenza,inj,quad, With Preservative 02/17/2019  . Influenza-Unspecified 03/09/2013, 02/28/2015, 02/14/2016  . PFIZER SARS-COV-2 Vaccination 08/11/2019, 09/06/2019  . Pneumococcal Conjugate-13 03/07/2009, 08/14/2013  . Pneumococcal Polysaccharide-23 03/07/2009, 08/08/2015  . Tdap 07/21/2012  . Zoster 06/16/2007       Objective:   Physical Exam BP 130/74 (BP Location: Left Arm, Cuff Size: Normal)   Pulse 77   Temp 97.9 F (36.6 C) (Temporal)   Ht 5\' 2"  (1.575 m)   Wt 172 lb 6.4 oz (78.2 kg)   SpO2 97%   BMI 31.53 kg/m  GENERAL: Overweight woman, no acute distress, fully ambulatory. HEAD: Normocephalic, atraumatic.  EYES: Pupils equal, round, reactive to light.  No scleral icterus.  MOUTH: Nose/mouth/throat not examined due to masking requirements for COVID 19. NECK: Supple. No thyromegaly. Trachea midline. No JVD.  No adenopathy. PULMONARY: Good air entry bilaterally.  Coarse  with no other adventitious sounds. CARDIOVASCULAR: S1 and S2. Regular rate and rhythm.  No rubs, murmurs or gallops heard. ABDOMEN: Nondistended, benign. MUSCULOSKELETAL: No joint deformity, no clubbing, no  edema.  NEUROLOGIC: Focal deficits noted, fluent speech, no gait disturbance. SKIN: Intact,warm,dry. PSYCH: Mood and behavior normal.    Assessment & Plan:     ICD-10-CM   1. Asthma-COPD overlap syndrome ([place])  J44.9    Continue Trelegy and as needed albuterol Well compensated  2. OSA (obstructive sleep apnea)  G47.33    Followed by ENT  3. History of breast cancer  Z85.3    This issue adds complexity to her management  4. DM (diabetes mellitus), type 2 with complications ([place])  H21.2    This issue adds complexity to her management Followed by primary care/endocrine  5. Personal history of tobacco use, presenting hazards to health  [phone]    On low-dose cancer screening protocol Last LDCT was 05/08/2019 Will be due for LDCT 05/08/2020    Discussion:  Patient is doing well from her asthma/COPD overlap syndrome.  Recommend continue use of Trelegy Ellipta 100/62.5/25, 1 inhalation daily.  Continue as needed albuterol.  Patient is contemplating relocating to the [facility] area.  Recommend that she continue follow-up with a pulmonary physician in that area.  Recommended Kelle Darting, MD.  She is to contact us when she is to make her relocation final.  Otherwise follow here on a as needed basis.  Renold Don, MD [facility]   *This note was dictated using voice recognition software/Dragon.  Despite best efforts to proofread, errors can occur which can change the meaning.  Any change was purely unintentional.

## 2019-12-27 NOTE — Patient Instructions (Signed)
We will leave your follow-up appointment (as needed)  Physicians recommended from that area are Dr. Mahlon Gammon and Dr. Ernst Breach, phone number for their office is [phone]  They are affiliated with [facility], they have access to epic which is our EMR and should have access to your records once they are requested

## 2020-01-03 ENCOUNTER — Ambulatory Visit: Admit: 2020-01-03 | Payer: Medicare Other | Admitting: Internal Medicine

## 2020-01-03 SURGERY — ESOPHAGOGASTRODUODENOSCOPY (EGD) WITH PROPOFOL
Anesthesia: General

## 2020-01-25 ENCOUNTER — Other Ambulatory Visit: Payer: Self-pay | Admitting: Internal Medicine

## 2020-02-04 ENCOUNTER — Other Ambulatory Visit: Payer: Self-pay | Admitting: Cardiovascular Disease

## 2020-02-08 ENCOUNTER — Other Ambulatory Visit: Payer: Self-pay | Admitting: Internal Medicine

## 2020-02-08 DIAGNOSIS — E1165 Type 2 diabetes mellitus with hyperglycemia: Secondary | ICD-10-CM

## 2020-02-14 ENCOUNTER — Telehealth: Payer: Self-pay | Admitting: Internal Medicine

## 2020-02-14 NOTE — Telephone Encounter (Signed)
Pt called to cancel all appointments with [place] because she is moving to [facility]. She said she will call back once she has a new doctor to remove Dr. Derrel Nip as her pcp. She said she appreciates all we have done for them.

## 2020-02-14 NOTE — Telephone Encounter (Signed)
FYI

## 2020-02-26 ENCOUNTER — Telehealth: Payer: Self-pay | Admitting: Cardiovascular Disease

## 2020-02-26 NOTE — Telephone Encounter (Signed)
[facility] needs prior auth for Repatha   Please call [phone]

## 2020-02-27 NOTE — Telephone Encounter (Signed)
Called the number below and started a PA for Repatha.   Spoke with [employer].   Prior Auth was approved.  Approval dates: 01/28/2020-01/26/2021  Case ID number [id number]

## 2020-02-29 ENCOUNTER — Telehealth: Payer: Self-pay | Admitting: Internal Medicine

## 2020-02-29 DIAGNOSIS — Z23 Encounter for immunization: Secondary | ICD-10-CM | POA: Diagnosis not present

## 2020-02-29 NOTE — Telephone Encounter (Signed)
Pt needs to be rescheduled for a 6 month follow up with Dr. Derrel Nip on the same day as his spouse, Rush Landmark  They have moved to [facility] but will be traveling back to see Dr. Derrel Nip until they can find a new PCP.  Please advise   Pt call back [phone]

## 2020-03-08 ENCOUNTER — Ambulatory Visit (INDEPENDENT_AMBULATORY_CARE_PROVIDER_SITE_OTHER): Payer: Medicare Other

## 2020-03-08 ENCOUNTER — Ambulatory Visit: Payer: Medicare Other

## 2020-03-08 ENCOUNTER — Telehealth: Payer: Self-pay

## 2020-03-08 VITALS — Ht 62.0 in | Wt 172.0 lb

## 2020-03-08 DIAGNOSIS — Z0000 Encounter for general adult medical examination without abnormal findings: Secondary | ICD-10-CM | POA: Diagnosis not present

## 2020-03-08 NOTE — Progress Notes (Addendum)
Subjective:   Traci Hall is a 74 y.o. female who presents for Medicare Annual (Subsequent) preventive examination.  Review of Systems    No ROS.  Medicare Wellness Virtual Visit.     Cardiac Risk Factors include: advanced age (>72men, >66 women);diabetes mellitus;hypertension     Objective:    Today's Vitals   03/08/20 1039  Weight: 172 lb (78 kg)  Height: 5\' 2"  (1.575 m)   Body mass index is 31.46 kg/m.  Advanced Directives 03/08/2020 03/08/2019 11/29/2017 11/26/2016 07/14/2016 07/14/2016 03/17/2016  Does Patient Have a Medical Advance Directive? Yes Yes Yes Yes Yes Yes Yes  Type of Advance Directive - [place];Living will [place];Living will [place];Living will Healthcare Power of [place];Living will -  Does patient want to make changes to medical advance directive? No - Patient declined No - Patient declined No - Patient declined No - Patient declined No - Patient declined No - Patient declined -  Copy of [place] in Chart? - No - copy requested No - copy requested No - copy requested No - copy requested No - copy requested -    Current Medications (verified) Outpatient Encounter Medications as of 03/08/2020  Medication Sig   albuterol (PROAIR HFA) 108 (90 Base) MCG/ACT inhaler USE 2 INHALATIONS EVERY MORNING ONLY   aspirin 81 MG tablet Take 81 mg by mouth daily.   bimatoprost (LUMIGAN) 0.03 % ophthalmic solution Place 1 drop into both eyes at bedtime.   calcium citrate-vitamin D (CITRACAL+D) 315-200 MG-UNIT tablet Take 1 tablet by mouth daily.   Cholecalciferol (VITAMIN D-3) 1000 UNITS CAPS Take 1 capsule by mouth daily.   Fluticasone-Umeclidin-Vilant (TRELEGY ELLIPTA) 100-62.5-25 MCG/INH AEPB Inhale 1 puff into the lungs daily.   glucose blood test strip Use to check blood sugars twice daily. Freestyle lite. Dx: E11.65   Lancets (FREESTYLE) lancets Freestyle   Monitor.  For use twice daily  in testing blood sugar: type 2 DM uncontrolled   loratadine (CLARITIN) 10 MG tablet Take 10 mg by mouth daily.   Multiple Vitamins-Minerals (CENTRUM SILVER PO) Take by mouth. Take 1 am    olmesartan (BENICAR) 40 MG tablet TAKE 1 TABLET DAILY   olopatadine (PATANOL) 0.1 % ophthalmic solution Place 1 drop into both eyes as needed.    pantoprazole (PROTONIX) 40 MG tablet TAKE 1 TABLET TWICE A DAY   Polyethyl Glycol-Propyl Glycol (SYSTANE OP) Apply to eye. 1-2 drops in each eye as needed.   REPATHA SURECLICK 242 MG/ML SOAJ INJECT 140 MG UNDER THE SKIN EVERY 14 DAYS   SYNJARDY XR 10-998 MG TB24 TAKE 2 TABLETS DAILY (TO REPLACE METFORMIN)   triamcinolone cream (KENALOG) 0.1 % Apply 1 application topically 2 (two) times daily.   vitamin C (ASCORBIC ACID) 500 MG tablet Take 500 mg by mouth daily.   vitamin E (VITAMIN E) 400 UNIT capsule Take 400 Units by mouth daily.   [DISCONTINUED] Omega-3 Fatty Acids (EQL OMEGA 3 FISH OIL) 1400 MG CAPS Take 1,400 mg by mouth daily.   No facility-administered encounter medications on file as of 03/08/2020.    Allergies (verified) Alendronate sodium, Demerol, Naproxen, and Statins   History: Past Medical History:  Diagnosis Date   Allergic rhinitis    Asthma    Barrett's esophagus    Cancer (HCC)    COPD (chronic obstructive pulmonary disease) ([place])    by prior PFTs   GERD (gastroesophageal reflux disease)  Hyperlipidemia    Hypertension    Obesity    OSA on CPAP    Personal history of tobacco use, presenting hazards to health 01/08/2015   Polyp of colon    Past Surgical History:  Procedure Laterality Date   ABDOMINAL HYSTERECTOMY  1991   secondary to endometriosis, and polyps   APPENDECTOMY  1977   BREAST BIOPSY Right 02/24/2016   path pending/biopsy   BREAST SURGERY  03/27/2016   Lymph node removal   COLONOSCOPY     ESOPHAGOGASTRODUODENOSCOPY     ESOPHAGOGASTRODUODENOSCOPY (EGD) WITH PROPOFOL N/A 03/17/2016    Procedure: ESOPHAGOGASTRODUODENOSCOPY (EGD) WITH PROPOFOL;  Surgeon: [name], MD;  Location: [facility] ENDOSCOPY;  Service: Endoscopy;  Laterality: N/A;   LIVER BIOPSY     NASAL SINUS SURGERY  2008   SKIN CANCER EXCISION  2015, 2017   Family History  Problem Relation Age of Onset   Heart disease Father        valvular cardiomyopathy,  mitral valve   Hyperlipidemia Father    COPD Mother    Hypertension Mother    Emphysema Mother    Hyperlipidemia Brother    Stroke Maternal Grandfather    Hyperlipidemia Brother    Hypertension Brother    Hyperlipidemia Brother    Hypertension Daughter    Hyperlipidemia Daughter    Hyperlipidemia Son    Autism Grandchild    Cancer Neg Hx    Social History   Socioeconomic History   Marital status: Married    Spouse name: Not on file   Number of children: 2   Years of education: Not on file   Highest education level: Not on file  Occupational History   Occupation: retired   Tobacco Use   Smoking status: Former Smoker    Packs/day: 1.50    Years: 45.00    Pack years: 67.50    Types: Cigarettes    Quit date: 06/15/2005    Years since quitting: 14.7   Smokeless tobacco: Never Used  Vaping Use   Vaping Use: Never used  Substance and Sexual Activity   Alcohol use: Yes    Alcohol/week: 2.0 standard drinks    Types: 2 Glasses of wine per week    Comment: with meals/social   Drug use: No   Sexual activity: Yes    Birth control/protection: Post-menopausal  Other Topics Concern   Not on file  Social History Narrative   Not on file   Social Determinants of Health   Financial Resource Strain: Low Risk    Difficulty of Paying Living Expenses: Not hard at all  Food Insecurity: No Food Insecurity   Worried About Charity fundraiser in the Last Year: Never true   [place] in the Last Year: Never true  Transportation Needs: No Transportation Needs   Lack of Transportation (Medical): No   Lack of Transportation (Non-Medical): No    Physical Activity: Unknown   Days of Exercise per Week: 0 days   Minutes of Exercise per Session: Not on file  Stress: No Stress Concern Present   Feeling of Stress : Not at all  Social Connections: Unknown   Frequency of Communication with Friends and Family: Not on file   Frequency of Social Gatherings with Friends and Family: Not on file   Attends Religious Services: Not on file   Active Member of Clubs or Organizations: Not on file   Attends Archivist Meetings: Not on file   Marital Status: Married  Tobacco Counseling Counseling given: Not Answered   Clinical Intake:  Pre-visit preparation completed: Yes        Diabetes: Yes (Followed by pcp)  How often do you need to have someone help you when you read instructions, pamphlets, or other written materials from your doctor or pharmacy?: 1 - Never  Interpreter Needed?: No      Activities of Daily Living In your present state of health, do you have any difficulty performing the following activities: 03/08/2020  Hearing? Y  Comment Hearing aids  Vision? N  Difficulty concentrating or making decisions? N  Walking or climbing stairs? N  Dressing or bathing? N  Doing errands, shopping? N  Preparing Food and eating ? N  Using the Toilet? N  In the past six months, have you accidently leaked urine? N  Do you have problems with loss of bowel control? N  Managing your Medications? N  Managing your Finances? N  Housekeeping or managing your Housekeeping? N  Some recent data might be hidden    Patient Care Team: [name], MD as PCP - General (Internal Medicine) Bary Castilla, Forest Gleason, MD (General Surgery) [name], MD (Internal Medicine)  Indicate any recent Medical Services you may have received from other than [facility] providers in the past year (date may be approximate).     Assessment:   This is a routine wellness examination for Traci Hall.  I connected with Sanye today by telephone and  verified that I am speaking with the correct person using two identifiers. Location patient: home Location provider: work Persons participating in the virtual visit: patient, Marine scientist.    I discussed the limitations, risks, security and privacy concerns of performing an evaluation and management service by telephone and the availability of in person appointments. The patient expressed understanding and verbally consented to this telephonic visit.    Interactive audio and video telecommunications were attempted between this provider and patient, however failed, due to patient having technical difficulties OR patient did not have access to video capability.  We continued and completed visit with audio only.  Some vital signs may be absent or patient reported.   Hearing/Vision screen  Hearing Screening   125Hz  250Hz  500Hz  1000Hz  2000Hz  3000Hz  4000Hz  6000Hz  8000Hz   Right ear:           Left ear:           Comments: Hearing aids  Vision Screening Comments: Followed [facility]  Wears corrective lenses  Diabetic exam; no retinopathy reported  Visual acuity not assessed per patient preference since they have regular follow up with the ophthalmologist Appointment scheduled 04/15/20  Dietary issues and exercise activities discussed: Healthy diet Good water intake  Goals      Increase physical activity       Depression Screen PHQ 2/9 Scores 03/08/2020 03/08/2019 03/01/2019 11/29/2017 11/26/2016 11/26/2014 05/30/2014  PHQ - 2 Score 0 0 0 0 0 0 0  PHQ- 9 Score - 0 0 1 0 - -    Fall Risk Fall Risk  03/08/2020 09/07/2019 03/09/2019 03/08/2019 03/01/2019  Falls in the past year? 0 0 0 0 0  Number falls in past yr: 0 - - - 0  Injury with Fall? 0 - - - 0  Follow up Falls evaluation completed Falls evaluation completed Falls evaluation completed - Falls evaluation completed   Handrails in use when climbing stairs? Yes Home free of loose throw rugs in walkways, pet beds, electrical cords,  etc? Yes  Adequate lighting in your home to reduce risk of falls? Yes   ASSISTIVE DEVICES UTILIZED TO PREVENT FALLS: Use of a cane, walker or w/c? No   TIMED UP AND GO:  Was the test performed? No . Virtual visit.   Cognitive Function: MMSE - Mini Mental State Exam 11/29/2017 11/26/2016  Orientation to time 5 5  Orientation to Place 5 5  Registration 3 3  Attention/ Calculation 5 5  Recall 3 3  Language- name 2 objects 2 2  Language- repeat 1 1  Language- follow 3 step command 3 3  Language- read & follow direction 1 1  Write a sentence 1 1  Copy design 1 1  Total score 30 30     6CIT Screen 03/08/2019  What Year? 0 points  What month? 0 points  What time? 0 points  Count back from 20 0 points  Months in reverse 0 points  Repeat phrase 0 points  Total Score 0    Immunizations Immunization History  Administered Date(s) Administered   Fluad Quad(high Dose 65+) 02/17/2019   Hep A / Hep B 05/19/2013, 06/20/2013, 11/17/2013   Influenza Split 03/21/2014   Influenza Whole 02/23/2011, 03/07/2012   Influenza, High Dose Seasonal PF 03/03/2017, 03/04/2018   Influenza,inj,quad, With Preservative 02/17/2019   Influenza-Unspecified 03/09/2013, 02/28/2015, 02/14/2016, 02/26/2020   PFIZER SARS-COV-2 Vaccination 08/11/2019, 09/06/2019   Pneumococcal Conjugate-13 03/07/2009, 08/14/2013   Pneumococcal Polysaccharide-23 03/07/2009, 08/08/2015   Tdap 07/21/2012   Zoster 06/16/2007    Health Maintenance Health Maintenance  Topic Date Due   FOOT EXAM  03/05/2019   OPHTHALMOLOGY EXAM  12/02/2019   HEMOGLOBIN A1C  03/09/2020   MAMMOGRAM  07/12/2020   COLONOSCOPY  03/07/2022   TETANUS/TDAP  07/21/2022   INFLUENZA VACCINE  Completed   DEXA SCAN  Completed   COVID-19 Vaccine  Completed   Hepatitis C Screening  Completed   PNA vac Low Risk Adult  Completed   Dental Screening: Recommended annual dental exams for proper oral hygiene.  Community Resource Referral / Chronic Care  Management: CRR required this visit?  No   CCM required this visit?  No      Plan:   Keep all routine maintenance appointments.   Follow up 03/18/20 @ 4:30  Patient has moved to new [facility] community in [place]. No follow up awv to be scheduled.   I have personally reviewed and noted the following in the patient's chart:   Medical and social history Use of alcohol, tobacco or illicit drugs  Current medications and supplements Functional ability and status Nutritional status Physical activity Advanced directives List of other physicians Hospitalizations, surgeries, and ER visits in previous 12 months Vitals Screenings to include cognitive, depression, and falls Referrals and appointments  In addition, I have reviewed and discussed with patient certain preventive protocols, quality metrics, and best practice recommendations. A written personalized care plan for preventive services as well as general preventive health recommendations were provided to patient via mychart.     OBrien-Blaney, Joeanna Howdyshell L, LPN   9/37/3428    I have reviewed the above information and agree with above.   Deborra Medina, MD

## 2020-03-08 NOTE — Telephone Encounter (Signed)
AWV visit completed.

## 2020-03-08 NOTE — Patient Instructions (Addendum)
Traci Hall , Thank you for taking time to come for your Medicare Wellness Visit. I appreciate your ongoing commitment to your health goals. Please review the following plan we discussed and let me know if I can assist you in the future.   These are the goals we discussed: Goals    . Increase physical activity       This is a list of the screening recommended for you and due dates:  Health Maintenance  Topic Date Due  . Complete foot exam   03/05/2019  . Eye exam for diabetics  12/02/2019  . Hemoglobin A1C  03/09/2020  . Mammogram  07/12/2020  . Colon Cancer Screening  03/07/2022  . Tetanus Vaccine  07/21/2022  . Flu Shot  Completed  . DEXA scan (bone density measurement)  Completed  . COVID-19 Vaccine  Completed  .  Hepatitis C: One time screening is recommended by Center for Disease Control  (CDC) for  adults born from [birth year] through 1965.   Completed  . Pneumonia vaccines  Completed    Immunizations Immunization History  Administered Date(s) Administered  . Fluad Quad(high Dose 65+) 02/17/2019  . Hep A / Hep B 05/19/2013, 06/20/2013, 11/17/2013  . Influenza Split 03/21/2014  . Influenza Whole 02/23/2011, 03/07/2012  . Influenza, High Dose Seasonal PF 03/03/2017, 03/04/2018  . Influenza,inj,quad, With Preservative 02/17/2019  . Influenza-Unspecified 03/09/2013, 02/28/2015, 02/14/2016, 02/26/2020  . PFIZER SARS-COV-2 Vaccination 08/11/2019, 09/06/2019  . Pneumococcal Conjugate-13 03/07/2009, 08/14/2013  . Pneumococcal Polysaccharide-23 03/07/2009, 08/08/2015  . Tdap 07/21/2012  . Zoster 06/16/2007   Advanced directives: completed.  Conditions/risks identified: none new.   Follow up in one year for your annual wellness visit.   Preventive Care 12 Years and Older, Female Preventive care refers to lifestyle choices and visits with your health care provider that can promote health and wellness. What does preventive care include?  A yearly physical exam. This is  also called an annual well check.  Dental exams once or twice a year.  Routine eye exams. Ask your health care provider how often you should have your eyes checked.  Personal lifestyle choices, including:  Daily care of your teeth and gums.  Regular physical activity.  Eating a healthy diet.  Avoiding tobacco and drug use.  Limiting alcohol use.  Practicing safe sex.  Taking low-dose aspirin every day.  Taking vitamin and mineral supplements as recommended by your health care provider. What happens during an annual well check? The services and screenings done by your health care provider during your annual well check will depend on your age, overall health, lifestyle risk factors, and family history of disease. Counseling  Your health care provider may ask you questions about your:  Alcohol use.  Tobacco use.  Drug use.  Emotional well-being.  Home and relationship well-being.  Sexual activity.  Eating habits.  History of falls.  Memory and ability to understand (cognition).  Work and work Statistician.  Reproductive health. Screening  You may have the following tests or measurements:  Height, weight, and BMI.  Blood pressure.  Lipid and cholesterol levels. These may be checked every 5 years, or more frequently if you are over 59 years old.  Skin check.  Lung cancer screening. You may have this screening every year starting at age 4 if you have a 30-pack-year history of smoking and currently smoke or have quit within the past 15 years.  Fecal occult blood test (FOBT) of the stool. You may have this test every  year starting at age 49.  Flexible sigmoidoscopy or colonoscopy. You may have a sigmoidoscopy every 5 years or a colonoscopy every 10 years starting at age 15.  Hepatitis C blood test.  Hepatitis B blood test.  Sexually transmitted disease (STD) testing.  Diabetes screening. This is done by checking your blood sugar (glucose) after you have  not eaten for a while (fasting). You may have this done every 1-3 years.  Bone density scan. This is done to screen for osteoporosis. You may have this done starting at age 38.  Mammogram. This may be done every 1-2 years. Talk to your health care provider about how often you should have regular mammograms. Talk with your health care provider about your test results, treatment options, and if necessary, the need for more tests. Vaccines  Your health care provider may recommend certain vaccines, such as:  Influenza vaccine. This is recommended every year.  Tetanus, diphtheria, and acellular pertussis (Tdap, Td) vaccine. You may need a Td booster every 10 years.  Zoster vaccine. You may need this after age 49.  Pneumococcal 13-valent conjugate (PCV13) vaccine. One dose is recommended after age 2.  Pneumococcal polysaccharide (PPSV23) vaccine. One dose is recommended after age 46. Talk to your health care provider about which screenings and vaccines you need and how often you need them. This information is not intended to replace advice given to you by your health care provider. Make sure you discuss any questions you have with your health care provider. Document Released: 06/28/2015 Document Revised: 02/19/2016 Document Reviewed: 04/02/2015 Elsevier Interactive Patient Education  2017 [place] Prevention in the Home Falls can cause injuries. They can happen to people of all ages. There are many things you can do to make your home safe and to help prevent falls. What can I do on the outside of my home?  Regularly fix the edges of walkways and driveways and fix any cracks.  Remove anything that might make you trip as you walk through a door, such as a raised step or threshold.  Trim any bushes or trees on the path to your home.  Use bright outdoor lighting.  Clear any walking paths of anything that might make someone trip, such as rocks or tools.  Regularly check to see  if handrails are loose or broken. Make sure that both sides of any steps have handrails.  Any raised decks and porches should have guardrails on the edges.  Have any leaves, snow, or ice cleared regularly.  Use sand or salt on walking paths during winter.  Clean up any spills in your garage right away. This includes oil or grease spills. What can I do in the bathroom?  Use night lights.  Install grab bars by the toilet and in the tub and shower. Do not use towel bars as grab bars.  Use non-skid mats or decals in the tub or shower.  If you need to sit down in the shower, use a plastic, non-slip stool.  Keep the floor dry. Clean up any water that spills on the floor as soon as it happens.  Remove soap buildup in the tub or shower regularly.  Attach bath mats securely with double-sided non-slip rug tape.  Do not have throw rugs and other things on the floor that can make you trip. What can I do in the bedroom?  Use night lights.  Make sure that you have a light by your bed that is easy to reach.  Do  not use any sheets or blankets that are too big for your bed. They should not hang down onto the floor.  Have a firm chair that has side arms. You can use this for support while you get dressed.  Do not have throw rugs and other things on the floor that can make you trip. What can I do in the kitchen?  Clean up any spills right away.  Avoid walking on wet floors.  Keep items that you use a lot in easy-to-reach places.  If you need to reach something above you, use a strong step stool that has a grab bar.  Keep electrical cords out of the way.  Do not use floor polish or wax that makes floors slippery. If you must use wax, use non-skid floor wax.  Do not have throw rugs and other things on the floor that can make you trip. What can I do with my stairs?  Do not leave any items on the stairs.  Make sure that there are handrails on both sides of the stairs and use them.  Fix handrails that are broken or loose. Make sure that handrails are as long as the stairways.  Check any carpeting to make sure that it is firmly attached to the stairs. Fix any carpet that is loose or worn.  Avoid having throw rugs at the top or bottom of the stairs. If you do have throw rugs, attach them to the floor with carpet tape.  Make sure that you have a light switch at the top of the stairs and the bottom of the stairs. If you do not have them, ask someone to add them for you. What else can I do to help prevent falls?  Wear shoes that:  Do not have high heels.  Have rubber bottoms.  Are comfortable and fit you well.  Are closed at the toe. Do not wear sandals.  If you use a stepladder:  Make sure that it is fully opened. Do not climb a closed stepladder.  Make sure that both sides of the stepladder are locked into place.  Ask someone to hold it for you, if possible.  Clearly mark and make sure that you can see:  Any grab bars or handrails.  First and last steps.  Where the edge of each step is.  Use tools that help you move around (mobility aids) if they are needed. These include:  Canes.  Walkers.  Scooters.  Crutches.  Turn on the lights when you go into a dark area. Replace any light bulbs as soon as they burn out.  Set up your furniture so you have a clear path. Avoid moving your furniture around.  If any of your floors are uneven, fix them.  If there are any pets around you, be aware of where they are.  Review your medicines with your doctor. Some medicines can make you feel dizzy. This can increase your chance of falling. Ask your doctor what other things that you can do to help prevent falls. This information is not intended to replace advice given to you by your health care provider. Make sure you discuss any questions you have with your health care provider. Document Released: 03/28/2009 Document Revised: 11/07/2015 Document Reviewed:  07/06/2014 Elsevier Interactive Patient Education  2017 [employer].

## 2020-03-11 ENCOUNTER — Ambulatory Visit: Payer: Medicare Other | Admitting: Internal Medicine

## 2020-03-13 ENCOUNTER — Other Ambulatory Visit: Payer: Self-pay | Admitting: Pulmonary Disease

## 2020-03-18 ENCOUNTER — Encounter: Payer: Self-pay | Admitting: Internal Medicine

## 2020-03-18 ENCOUNTER — Other Ambulatory Visit: Payer: Self-pay

## 2020-03-18 ENCOUNTER — Telehealth (INDEPENDENT_AMBULATORY_CARE_PROVIDER_SITE_OTHER): Payer: Medicare Other | Admitting: Internal Medicine

## 2020-03-18 DIAGNOSIS — K760 Fatty (change of) liver, not elsewhere classified: Secondary | ICD-10-CM | POA: Diagnosis not present

## 2020-03-18 DIAGNOSIS — K2270 Barrett's esophagus without dysplasia: Secondary | ICD-10-CM

## 2020-03-18 DIAGNOSIS — T466X5A Adverse effect of antihyperlipidemic and antiarteriosclerotic drugs, initial encounter: Secondary | ICD-10-CM

## 2020-03-18 DIAGNOSIS — I700 Atherosclerosis of aorta: Secondary | ICD-10-CM | POA: Diagnosis not present

## 2020-03-18 DIAGNOSIS — R232 Flushing: Secondary | ICD-10-CM

## 2020-03-18 DIAGNOSIS — E118 Type 2 diabetes mellitus with unspecified complications: Secondary | ICD-10-CM | POA: Diagnosis not present

## 2020-03-18 DIAGNOSIS — M791 Myalgia, unspecified site: Secondary | ICD-10-CM | POA: Diagnosis not present

## 2020-03-18 DIAGNOSIS — R61 Generalized hyperhidrosis: Secondary | ICD-10-CM | POA: Diagnosis not present

## 2020-03-19 DIAGNOSIS — I700 Atherosclerosis of aorta: Secondary | ICD-10-CM | POA: Insufficient documentation

## 2020-03-19 DIAGNOSIS — M791 Myalgia, unspecified site: Secondary | ICD-10-CM | POA: Insufficient documentation

## 2020-03-19 NOTE — Assessment & Plan Note (Signed)
Found on 2014 EGD , biopsy proven..  Negative biopsies in 2017.  Recent EGD was cancelled by patient due to  Move; continue and PPI and follow up with [facility] ASAP to arrange the overdue surveillance.

## 2020-03-19 NOTE — Progress Notes (Signed)
Telephone Note  This visit type was conducted due to national recommendations for restrictions regarding the COVID-19 pandemic (e.g. social distancing).  This format is felt to be most appropriate for this patient at this time.  All issues noted in this document were discussed and addressed.  No physical exam was performed (except for noted visual exam findings with Video Visits).   I connected with@ on 03/18/20 at  4:30 PM EDT by telephone and verified that I am speaking with the correct person using two identifiers. Location patient: home Location provider: work or home office Persons participating in the virtual visit: patient, provider  I discussed the limitations, risks, security and privacy concerns of performing an evaluation and management service by telephone and the availability of in person appointments. I also discussed with the patient that there may be a patient responsible charge related to this service. The patient expressed understanding and agreed to proceed.  Reason for visit: follow up,  Transition to local provider   HPI:   74 YR OLD female with history of Breast Cancer , type 2 DM, aortic atherosclerosis, GERD/Barrett;s Esophagus  and COPD presents for transition visit . Patient and husband Gwyndolyn Saxon have relocated from [place] to [place] due to husband's declining health /progressive dementia. She has established care with her daughter's family physician for both herself and husband.   She has no complaints today.  She is acclimating to her smaller home and enjoying the proximity to her daughter and many amenities that will make their lives more convenient.    Labs and medications reviewed.    T2DM:  She  feels generally well,   Is  exercising regularly  and trying to lose weight. Checking  blood sugars less than once daily at variable times, usually only if she feels she may be having a hypoglycemic event. .  BS have been under 130 fasting and < 150 post prandially.   Denies any recent hypoglyemic events.  Taking   medications as directed. Following a carbohydrate modified diet 6 days per week. Denies numbness, burning and tingling of extremities. Appetite is good.   Thoracic aortic atherosclerosis:  Reviewed with patient today, noted on recent lung CT.  she is taking Repatha due to statin intolerance.   ROS: See pertinent positives and negatives per HPI.  Past Medical History:  Diagnosis Date  . Allergic rhinitis   . Asthma   . Barrett's esophagus   . Cancer ([place])   . COPD (chronic obstructive pulmonary disease) ([place])    by prior PFTs  . GERD (gastroesophageal reflux disease)   . Hyperlipidemia   . Hypertension   . Obesity   . OSA on CPAP   . Personal history of tobacco use, presenting hazards to health 01/08/2015  . Polyp of colon     Past Surgical History:  Procedure Laterality Date  . ABDOMINAL HYSTERECTOMY  1991   secondary to endometriosis, and polyps  . APPENDECTOMY  1977  . BREAST BIOPSY Right 02/24/2016   path pending/biopsy  . BREAST SURGERY  03/27/2016   Lymph node removal  . COLONOSCOPY    . ESOPHAGOGASTRODUODENOSCOPY    . ESOPHAGOGASTRODUODENOSCOPY (EGD) WITH PROPOFOL N/A 03/17/2016   Procedure: ESOPHAGOGASTRODUODENOSCOPY (EGD) WITH PROPOFOL;  Surgeon: [name], MD;  Location: [facility] ENDOSCOPY;  Service: Endoscopy;  Laterality: N/A;  . LIVER BIOPSY    . NASAL SINUS SURGERY  2008  . SKIN CANCER EXCISION  2015, 2017    Family History  Problem Relation Age of Onset  .  Heart disease Father        valvular cardiomyopathy,  mitral valve  . Hyperlipidemia Father   . COPD Mother   . Hypertension Mother   . Emphysema Mother   . Hyperlipidemia Brother   . Stroke Maternal Grandfather   . Hyperlipidemia Brother   . Hypertension Brother   . Hyperlipidemia Brother   . Hypertension Daughter   . Hyperlipidemia Daughter   . Hyperlipidemia Son   . Autism Grandchild   . Cancer Neg Hx     SOCIAL HX:  reports that she quit  smoking about 14 years ago. Her smoking use included cigarettes. She has a 67.50 pack-year smoking history. She has never used smokeless tobacco. She reports current alcohol use of about 2.0 standard drinks of alcohol per week. She reports that she does not use drugs.   Current Outpatient Medications:  .  albuterol (PROAIR HFA) 108 (90 Base) MCG/ACT inhaler, USE 2 INHALATIONS EVERY MORNING ONLY, Disp: 25.5 g, Rfl: 3 .  aspirin 81 MG tablet, Take 81 mg by mouth daily., Disp: , Rfl:  .  bimatoprost (LUMIGAN) 0.03 % ophthalmic solution, Place 1 drop into both eyes at bedtime., Disp: , Rfl:  .  calcium citrate-vitamin D (CITRACAL+D) 315-200 MG-UNIT tablet, Take 1 tablet by mouth daily., Disp: , Rfl:  .  Cholecalciferol (VITAMIN D-3) 1000 UNITS CAPS, Take 1 capsule by mouth daily., Disp: , Rfl:  .  glucose blood test strip, Use to check blood sugars twice daily. Freestyle lite. Dx: E11.65, Disp: 200 each, Rfl: 3 .  Lancets (FREESTYLE) lancets, Freestyle  Monitor.  For use twice daily  in testing blood sugar: type 2 DM uncontrolled, Disp: 180 each, Rfl: 3 .  loratadine (CLARITIN) 10 MG tablet, Take 10 mg by mouth daily., Disp: , Rfl:  .  Multiple Vitamins-Minerals (CENTRUM SILVER PO), Take by mouth. Take 1 am , Disp: , Rfl:  .  olmesartan (BENICAR) 40 MG tablet, TAKE 1 TABLET DAILY, Disp: 90 tablet, Rfl: 2 .  olopatadine (PATANOL) 0.1 % ophthalmic solution, Place 1 drop into both eyes as needed. , Disp: , Rfl:  .  pantoprazole (PROTONIX) 40 MG tablet, TAKE 1 TABLET TWICE A DAY, Disp: 180 tablet, Rfl: 3 .  Polyethyl Glycol-Propyl Glycol (SYSTANE OP), Apply to eye. 1-2 drops in each eye as needed., Disp: , Rfl:  .  REPATHA SURECLICK 056 MG/ML SOAJ, INJECT 140 MG UNDER THE SKIN EVERY 14 DAYS, Disp: 6 mL, Rfl: 3 .  SYNJARDY XR 10-998 MG TB24, TAKE 2 TABLETS DAILY (TO REPLACE METFORMIN), Disp: 180 tablet, Rfl: 3 .  TRELEGY ELLIPTA 100-62.5-25 MCG/INH AEPB, USE 1 INHALATION DAILY, Disp: 360 each, Rfl: 3 .   triamcinolone cream (KENALOG) 0.1 %, Apply 1 application topically 2 (two) times daily., Disp: 30 g, Rfl: 0 .  vitamin C (ASCORBIC ACID) 500 MG tablet, Take 500 mg by mouth daily., Disp: , Rfl:  .  vitamin E (VITAMIN E) 400 UNIT capsule, Take 400 Units by mouth daily., Disp: , Rfl:   EXAM:  VITALS per patient if applicable:   General impression: alert, cooperative and articulate.  No signs of being in distress  Lungs: speech is fluent sentence length suggests that patient is not short of breath and not punctuated by cough, sneezing or sniffing. Marland Kitchen   Psych: affect normal.  speech is articulate and non pressured .  Denies suicidal thoughts   PSYCH/NEURO: pleasant and cooperative, no obvious depression or anxiety, speech and thought processing grossly intact  ASSESSMENT AND PLAN:  Discussed the following assessment and plan:  Barrett's esophagus without dysplasia  Nonalcoholic fatty liver disease without nonalcoholic steatohepatitis (NASH)  DM (diabetes mellitus), type 2 with complications (HCC)  Abnormal flushing and sweating  Thoracic aortic atherosclerosis (HCC)  Myalgia due to statin  Barrett's esophagus Found on 2014 EGD , biopsy proven..  Negative biopsies in 2017.  Recent EGD was cancelled by patient due to  Move; continue and PPI and follow up with [facility] ASAP to arrange the overdue surveillance.  Nonalcoholic fatty liver disease without nonalcoholic steatohepatitis (NASH) Presumed by ultrasound changes and serologies negative for autoimmune causes of hepatitis.  Most recent  liver enzymes are normal and all modifiable risk factors including obesity, diabetes and hyperlipidemia have been addressed.  She is taking metformin, fish oil and is statin intolerant.  She is due for ultrasound repeat per [facility] (last one was 2018)  Lab Results  Component Value Date   ALT 28 09/07/2019   AST 26 09/07/2019   ALKPHOS 59 09/07/2019   BILITOT 0.5 09/07/2019     DM (diabetes  mellitus), type 2 with complications (HCC) Improved control with  Synjardy (metformin/empagliflozin) XR 1000-5 mg, 2 tablet daily.  No changes today.  She has been taking asa and  ARB.  She is statin intolerant. Follow up with new PCP for labs   Lab Results  Component Value Date   HGBA1C 5.9 09/07/2019   Lab Results  Component Value Date   MICROALBUR <0.7 06/13/2018   MICROALBUR <0.7 11/26/2016        Abnormal flushing and sweating ddx includes menopause, neuroendocrine CA and ovarian CA.  24 hr urine collection for 5HIAA,  CA 127,  And pelvic ultrasound were nondiagnostic    Thoracic aortic atherosclerosis ([place]) Reviewed findings of prior CT scan today..  Patient is statin intolerant but is tolerating Repatha   Lab Results  Component Value Date   CHOL 151 09/07/2019   HDL 58.50 09/07/2019   LDLCALC 63 09/07/2019   LDLDIRECT 240.0 06/13/2018   TRIG 150.0 (H) 09/07/2019   CHOLHDL 3 09/07/2019     Myalgia due to statin Hyperlipidemia is managed with Repatha.     I discussed the assessment and treatment plan with the patient. The patient was provided an opportunity to ask questions and all were answered. The patient agreed with the plan and demonstrated an understanding of the instructions.   The patient was advised to call back or seek an in-person evaluation if the symptoms worsen or if the condition fails to improve as anticipated.  I provided 25 minutes of non-face-to-face time during this encounter.   [name], MD

## 2020-03-19 NOTE — Assessment & Plan Note (Signed)
Reviewed findings of prior CT scan today..  Patient is statin intolerant but is tolerating Repatha   Lab Results  Component Value Date   CHOL 151 09/07/2019   HDL 58.50 09/07/2019   LDLCALC 63 09/07/2019   LDLDIRECT 240.0 06/13/2018   TRIG 150.0 (H) 09/07/2019   CHOLHDL 3 09/07/2019

## 2020-03-19 NOTE — Assessment & Plan Note (Signed)
Hyperlipidemia is managed with Repatha.

## 2020-03-19 NOTE — Assessment & Plan Note (Signed)
ddx includes menopause, neuroendocrine CA and ovarian CA.  24 hr urine collection for 5HIAA,  CA 127,  And pelvic ultrasound were nondiagnostic

## 2020-03-19 NOTE — Assessment & Plan Note (Signed)
Improved control with  Synjardy (metformin/empagliflozin) XR 1000-5 mg, 2 tablet daily.  No changes today.  She has been taking asa and  ARB.  She is statin intolerant. Follow up with new PCP for labs   Lab Results  Component Value Date   HGBA1C 5.9 09/07/2019   Lab Results  Component Value Date   MICROALBUR <0.7 06/13/2018   MICROALBUR <0.7 11/26/2016

## 2020-03-19 NOTE — Assessment & Plan Note (Addendum)
Presumed by ultrasound changes and serologies negative for autoimmune causes of hepatitis.  Most recent  liver enzymes are normal and all modifiable risk factors including obesity, diabetes and hyperlipidemia have been addressed.  She is taking metformin, fish oil and is statin intolerant.  She is due for ultrasound repeat per [facility] (last one was 2018)  Lab Results  Component Value Date   ALT 28 09/07/2019   AST 26 09/07/2019   ALKPHOS 59 09/07/2019   BILITOT 0.5 09/07/2019

## 2020-03-26 ENCOUNTER — Telehealth: Payer: Self-pay | Admitting: Pulmonary Disease

## 2020-03-26 DIAGNOSIS — Z171 Estrogen receptor negative status [ER-]: Secondary | ICD-10-CM | POA: Diagnosis not present

## 2020-03-26 DIAGNOSIS — C50411 Malignant neoplasm of upper-outer quadrant of right female breast: Secondary | ICD-10-CM | POA: Diagnosis not present

## 2020-03-26 MED ORDER — GLUCOSE BLOOD VI STRP: ORAL_STRIP | 3 refills | Status: AC

## 2020-03-26 MED ORDER — FREESTYLE LANCETS MISC: 3 refills | Status: AC

## 2020-03-26 NOTE — Telephone Encounter (Signed)
Error

## 2020-04-08 ENCOUNTER — Telehealth: Payer: Self-pay | Admitting: Pulmonary Disease

## 2020-04-08 DIAGNOSIS — J449 Chronic obstructive pulmonary disease, unspecified: Secondary | ICD-10-CM

## 2020-04-08 NOTE — Telephone Encounter (Signed)
Referral should be fine.

## 2020-04-08 NOTE — Telephone Encounter (Signed)
Spoke to patient, who is requesting a referral to Dr. Kelle Darting with [facility]. She recently moved to [facility].  Dr. Patsey Berthold, please advise if okay to send referral. Thanks

## 2020-04-09 NOTE — Telephone Encounter (Signed)
Referral has been placed to Dr. Kelle Darting.  Patient is aware and voiced her understanding.  Nothing further needed.

## 2020-04-10 IMAGING — DX DG HIP (WITH OR WITHOUT PELVIS) 2-3V LEFT
3 series · 3 of 3 positions shown · non-contrast
Comparison: None.

CLINICAL DATA: Left hip pain

EXAM:
DG HIP (WITH OR WITHOUT PELVIS) 3V LEFT

[pelvis ap]
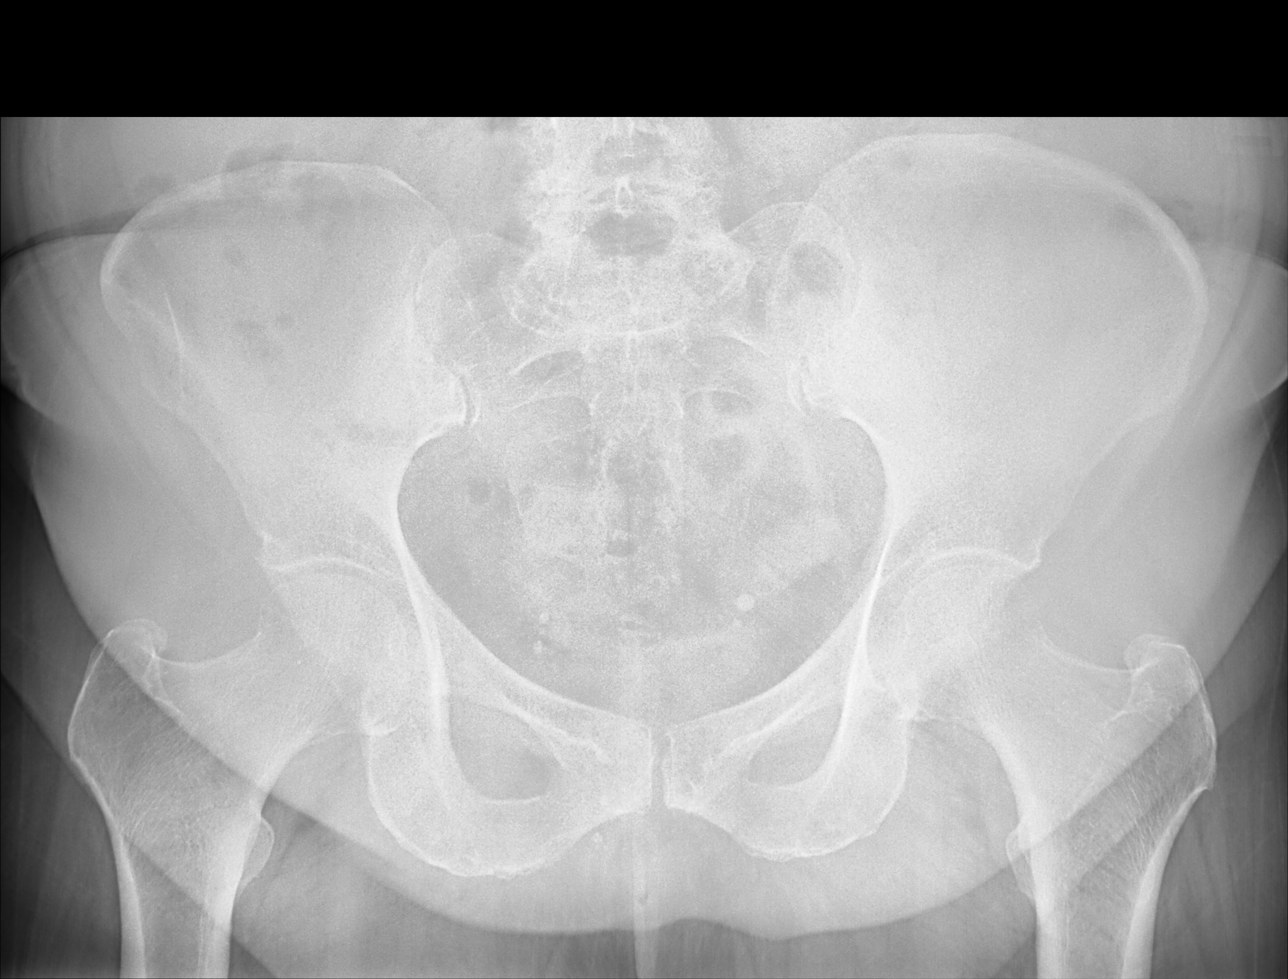

[hip joint ap]
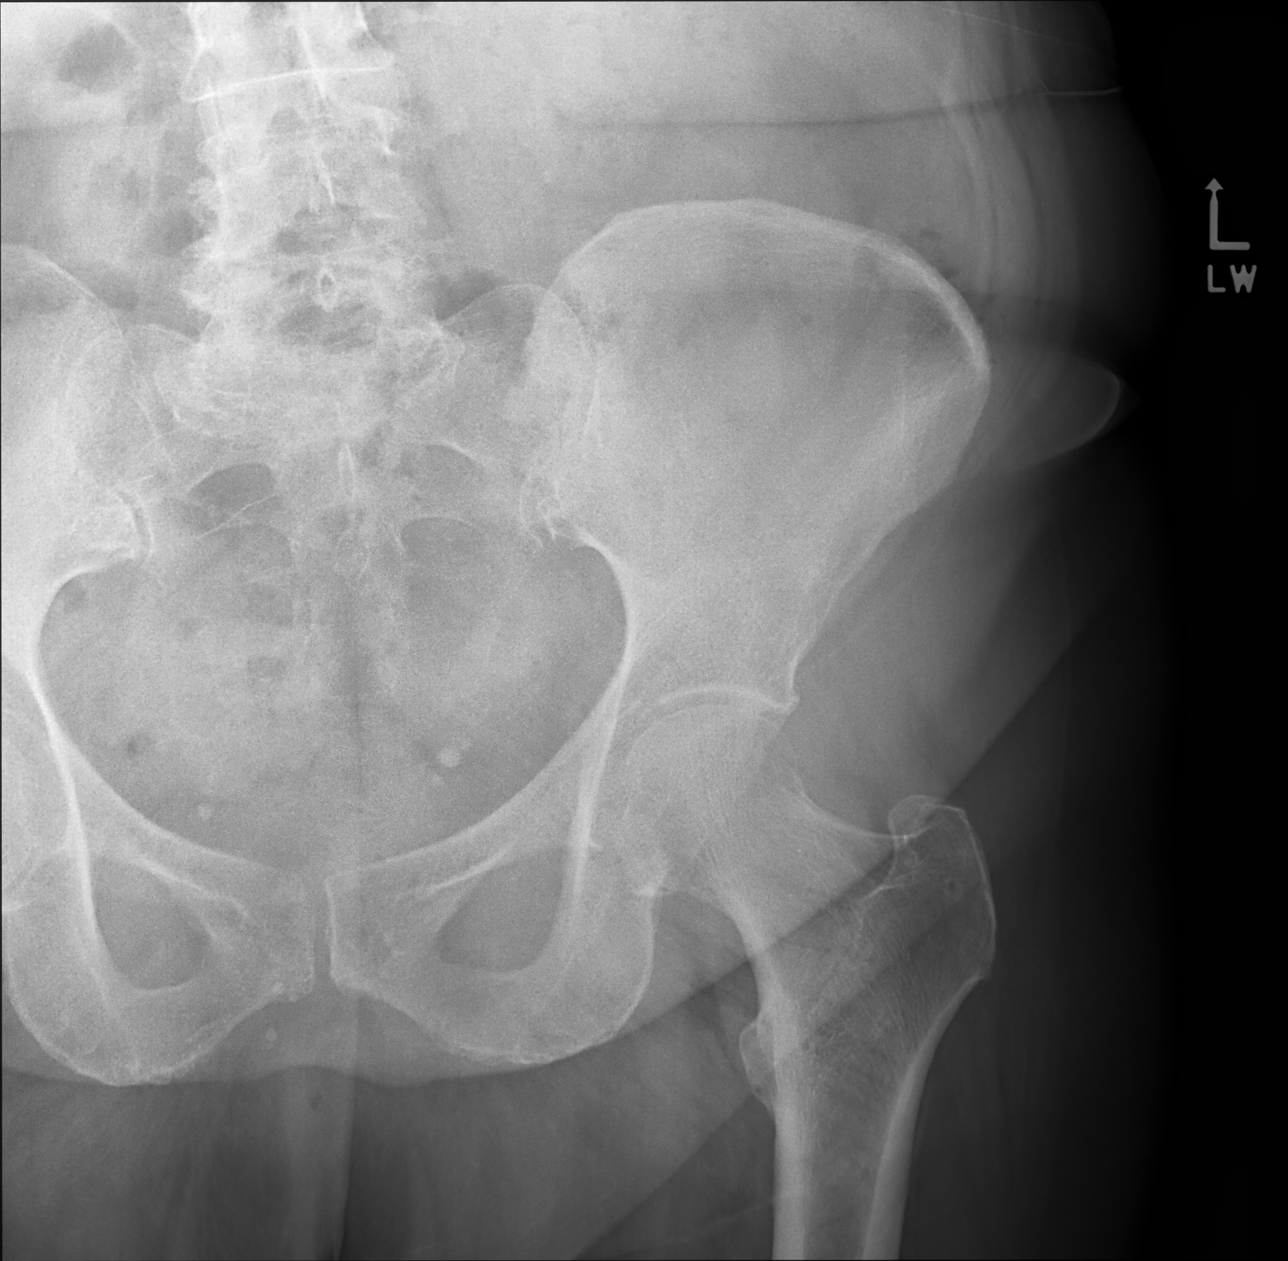

[ap frog obl (oblique)]
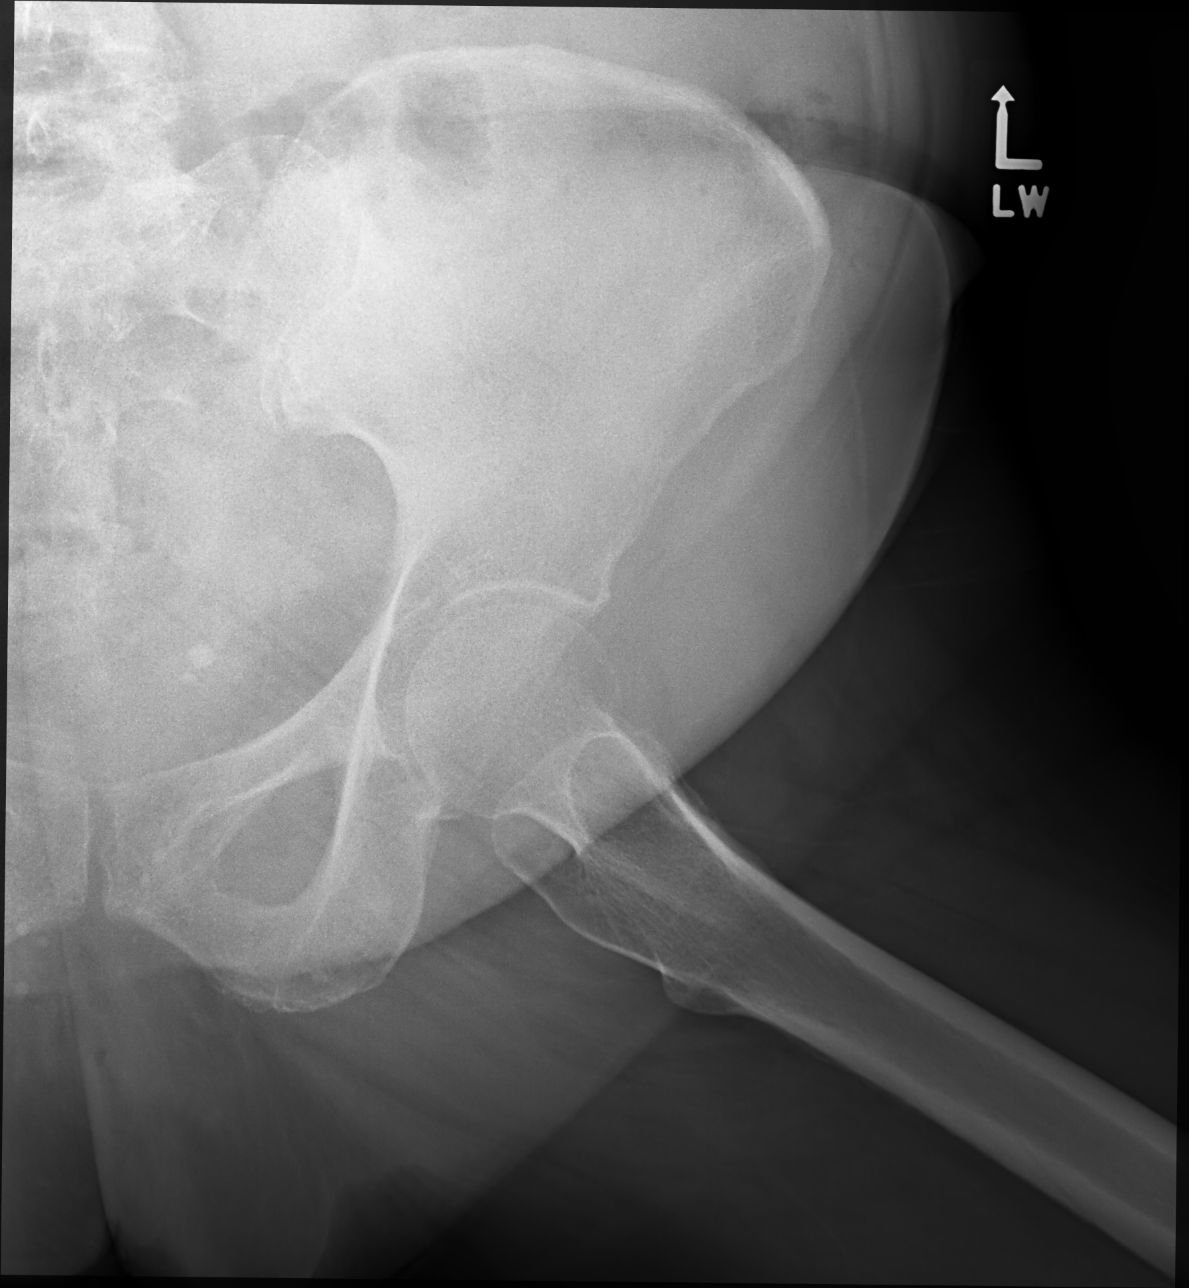

[3 of 3 positions shown; findings below may reference images not displayed]

FINDINGS: Pelvic ring is intact. No acute fracture or dislocation is noted. No
soft tissue abnormality is seen.
IMPRESSION: No acute abnormality noted.

## 2020-04-11 ENCOUNTER — Encounter: Payer: Self-pay | Admitting: Pulmonary Disease

## 2020-04-15 DIAGNOSIS — H401111 Primary open-angle glaucoma, right eye, mild stage: Secondary | ICD-10-CM | POA: Diagnosis not present

## 2020-04-15 DIAGNOSIS — H2513 Age-related nuclear cataract, bilateral: Secondary | ICD-10-CM | POA: Diagnosis not present

## 2020-04-15 DIAGNOSIS — H40053 Ocular hypertension, bilateral: Secondary | ICD-10-CM | POA: Diagnosis not present

## 2020-04-24 DIAGNOSIS — J301 Allergic rhinitis due to pollen: Secondary | ICD-10-CM | POA: Diagnosis not present

## 2020-04-24 DIAGNOSIS — G4733 Obstructive sleep apnea (adult) (pediatric): Secondary | ICD-10-CM | POA: Diagnosis not present

## 2020-04-24 DIAGNOSIS — H903 Sensorineural hearing loss, bilateral: Secondary | ICD-10-CM | POA: Diagnosis not present

## 2020-04-29 ENCOUNTER — Telehealth: Payer: Self-pay | Admitting: Pulmonary Disease

## 2020-04-29 NOTE — Telephone Encounter (Signed)
Re'd medical record release to [facility]. Sent to medical records for processing.//TM

## 2020-04-30 ENCOUNTER — Other Ambulatory Visit: Payer: Self-pay | Admitting: *Deleted

## 2020-04-30 DIAGNOSIS — Z87891 Personal history of nicotine dependence: Secondary | ICD-10-CM

## 2020-04-30 DIAGNOSIS — Z122 Encounter for screening for malignant neoplasm of respiratory organs: Secondary | ICD-10-CM

## 2020-04-30 NOTE — Progress Notes (Signed)
Contacted and scheduled for lung screening scan. Patient is a former smoker, quit 2007, 50 pack year history.

## 2020-05-02 DIAGNOSIS — I472 Ventricular tachycardia: Secondary | ICD-10-CM | POA: Diagnosis not present

## 2020-05-02 DIAGNOSIS — I10 Essential (primary) hypertension: Secondary | ICD-10-CM | POA: Diagnosis not present

## 2020-05-02 DIAGNOSIS — E785 Hyperlipidemia, unspecified: Secondary | ICD-10-CM | POA: Diagnosis not present

## 2020-05-02 DIAGNOSIS — Z789 Other specified health status: Secondary | ICD-10-CM | POA: Diagnosis not present

## 2020-05-02 DIAGNOSIS — R002 Palpitations: Secondary | ICD-10-CM | POA: Diagnosis not present

## 2020-05-02 DIAGNOSIS — I2510 Atherosclerotic heart disease of native coronary artery without angina pectoris: Secondary | ICD-10-CM | POA: Diagnosis not present

## 2020-05-06 DIAGNOSIS — Z20822 Contact with and (suspected) exposure to covid-19: Secondary | ICD-10-CM | POA: Diagnosis not present

## 2020-05-13 DIAGNOSIS — H401111 Primary open-angle glaucoma, right eye, mild stage: Secondary | ICD-10-CM | POA: Diagnosis not present

## 2020-05-17 DIAGNOSIS — Z23 Encounter for immunization: Secondary | ICD-10-CM | POA: Diagnosis not present

## 2020-05-22 ENCOUNTER — Other Ambulatory Visit: Payer: Self-pay

## 2020-05-22 ENCOUNTER — Ambulatory Visit
Admission: RE | Admit: 2020-05-22 | Discharge: 2020-05-22 | Disposition: A | Discharge status: Home / Self Care | Payer: Medicare Other | Source: Ambulatory Visit | Attending: Nurse Practitioner | Admitting: Nurse Practitioner

## 2020-05-22 DIAGNOSIS — Z122 Encounter for screening for malignant neoplasm of respiratory organs: Secondary | ICD-10-CM | POA: Insufficient documentation

## 2020-05-22 DIAGNOSIS — Z87891 Personal history of nicotine dependence: Secondary | ICD-10-CM | POA: Insufficient documentation

## 2020-05-27 ENCOUNTER — Encounter: Payer: Self-pay | Admitting: *Deleted

## 2021-08-15 LAB — EXTERNAL GENERIC LAB PROCEDURE: COLOGUARD: NEGATIVE
